# Patient Record
Sex: Male | Born: 1950 | Race: White | Hispanic: No | Marital: Married | Smoking: Former smoker
Health system: Southern US, Community
[De-identification: ages and names within clinical notes are randomized; demographics above are authoritative.]

## PROBLEM LIST (undated history)

## (undated) DIAGNOSIS — F32A Depression, unspecified: Secondary | ICD-10-CM

## (undated) DIAGNOSIS — G35 Multiple sclerosis: Secondary | ICD-10-CM

## (undated) DIAGNOSIS — G709 Myoneural disorder, unspecified: Secondary | ICD-10-CM

## (undated) DIAGNOSIS — K219 Gastro-esophageal reflux disease without esophagitis: Secondary | ICD-10-CM

## (undated) DIAGNOSIS — G4730 Sleep apnea, unspecified: Secondary | ICD-10-CM

## (undated) DIAGNOSIS — T4145XA Adverse effect of unspecified anesthetic, initial encounter: Secondary | ICD-10-CM

## (undated) DIAGNOSIS — F329 Major depressive disorder, single episode, unspecified: Secondary | ICD-10-CM

## (undated) DIAGNOSIS — T8859XA Other complications of anesthesia, initial encounter: Secondary | ICD-10-CM

## (undated) DIAGNOSIS — I2510 Atherosclerotic heart disease of native coronary artery without angina pectoris: Secondary | ICD-10-CM

## (undated) DIAGNOSIS — E119 Type 2 diabetes mellitus without complications: Secondary | ICD-10-CM

## (undated) DIAGNOSIS — I10 Essential (primary) hypertension: Secondary | ICD-10-CM

## (undated) HISTORY — PX: CORONARY ANGIOPLASTY: SHX604

## (undated) HISTORY — DX: Myoneural disorder, unspecified: G70.9

## (undated) HISTORY — DX: Type 2 diabetes mellitus without complications: E11.9

## (undated) HISTORY — PX: OTHER SURGICAL HISTORY: SHX169

## (undated) HISTORY — DX: Essential (primary) hypertension: I10

## (undated) HISTORY — DX: Gastro-esophageal reflux disease without esophagitis: K21.9

## (undated) HISTORY — DX: Multiple sclerosis: G35

## (undated) HISTORY — PX: TONSILLECTOMY: SUR1361

## (undated) HISTORY — DX: Depression, unspecified: F32.A

## (undated) HISTORY — DX: Major depressive disorder, single episode, unspecified: F32.9

---

## 2004-12-10 ENCOUNTER — Ambulatory Visit: Payer: Self-pay

## 2005-01-14 ENCOUNTER — Ambulatory Visit: Payer: Self-pay | Admitting: Internal Medicine

## 2006-01-08 ENCOUNTER — Ambulatory Visit: Payer: Self-pay | Admitting: Internal Medicine

## 2006-04-17 ENCOUNTER — Emergency Department: Payer: Self-pay | Admitting: Emergency Medicine

## 2006-12-22 ENCOUNTER — Ambulatory Visit: Payer: Self-pay | Admitting: Internal Medicine

## 2006-12-27 ENCOUNTER — Ambulatory Visit: Payer: Self-pay | Admitting: Internal Medicine

## 2007-01-13 ENCOUNTER — Ambulatory Visit: Payer: Self-pay | Admitting: Unknown Physician Specialty

## 2007-01-19 ENCOUNTER — Encounter: Payer: Self-pay | Admitting: Internal Medicine

## 2007-07-07 ENCOUNTER — Ambulatory Visit: Payer: Self-pay

## 2007-11-02 ENCOUNTER — Ambulatory Visit: Payer: Self-pay | Admitting: Family Medicine

## 2007-11-05 ENCOUNTER — Ambulatory Visit: Payer: Self-pay | Admitting: Family Medicine

## 2007-12-01 ENCOUNTER — Ambulatory Visit: Payer: Self-pay | Admitting: Unknown Physician Specialty

## 2007-12-04 ENCOUNTER — Ambulatory Visit: Payer: Self-pay | Admitting: Unknown Physician Specialty

## 2008-04-23 ENCOUNTER — Ambulatory Visit: Payer: Self-pay | Admitting: Family Medicine

## 2011-03-09 ENCOUNTER — Ambulatory Visit: Payer: Self-pay | Admitting: Surgery

## 2011-12-01 DIAGNOSIS — I2510 Atherosclerotic heart disease of native coronary artery without angina pectoris: Secondary | ICD-10-CM | POA: Insufficient documentation

## 2011-12-01 DIAGNOSIS — E785 Hyperlipidemia, unspecified: Secondary | ICD-10-CM | POA: Insufficient documentation

## 2011-12-01 DIAGNOSIS — E119 Type 2 diabetes mellitus without complications: Secondary | ICD-10-CM | POA: Insufficient documentation

## 2011-12-01 DIAGNOSIS — I10 Essential (primary) hypertension: Secondary | ICD-10-CM | POA: Insufficient documentation

## 2012-03-01 ENCOUNTER — Ambulatory Visit: Payer: Self-pay | Admitting: Unknown Physician Specialty

## 2012-03-02 LAB — PATHOLOGY REPORT

## 2012-05-26 ENCOUNTER — Ambulatory Visit: Payer: Self-pay | Admitting: Unknown Physician Specialty

## 2012-05-27 LAB — CLOSTRIDIUM DIFFICILE BY PCR

## 2012-05-31 DIAGNOSIS — L97809 Non-pressure chronic ulcer of other part of unspecified lower leg with unspecified severity: Secondary | ICD-10-CM | POA: Insufficient documentation

## 2012-05-31 DIAGNOSIS — L98499 Non-pressure chronic ulcer of skin of other sites with unspecified severity: Secondary | ICD-10-CM

## 2012-05-31 DIAGNOSIS — I771 Stricture of artery: Secondary | ICD-10-CM | POA: Insufficient documentation

## 2012-05-31 DIAGNOSIS — L89159 Pressure ulcer of sacral region, unspecified stage: Secondary | ICD-10-CM | POA: Insufficient documentation

## 2012-07-17 ENCOUNTER — Ambulatory Visit: Payer: Self-pay | Admitting: Internal Medicine

## 2012-07-19 HISTORY — PX: HERNIA REPAIR: SHX51

## 2012-08-03 ENCOUNTER — Encounter: Payer: Self-pay | Admitting: Nurse Practitioner

## 2012-08-03 ENCOUNTER — Encounter: Payer: Self-pay | Admitting: Cardiothoracic Surgery

## 2012-08-03 ENCOUNTER — Ambulatory Visit: Payer: Self-pay | Admitting: Surgery

## 2012-08-03 LAB — BASIC METABOLIC PANEL
Anion Gap: 4 — ABNORMAL LOW (ref 7–16)
BUN: 11 mg/dL (ref 7–18)
Calcium, Total: 9.1 mg/dL (ref 8.5–10.1)
EGFR (African American): >60
EGFR (Non-African Amer.): >60
Glucose: 109 mg/dL — ABNORMAL HIGH (ref 65–99)
Osmolality: 281 (ref 275–301)
Potassium: 4.7 mmol/L (ref 3.5–5.1)
Sodium: 141 mmol/L (ref 136–145)

## 2012-08-03 LAB — CBC WITH DIFFERENTIAL/PLATELET
Basophil #: 0.1 10*3/uL (ref 0.0–0.1)
Eosinophil #: 0.8 10*3/uL — ABNORMAL HIGH (ref 0.0–0.7)
HGB: 16.2 g/dL (ref 13.0–18.0)
Lymphocyte #: 3.8 10*3/uL — ABNORMAL HIGH (ref 1.0–3.6)
Lymphocyte %: 37.8 %
MCV: 97 fL (ref 80–100)
Monocyte %: 7.4 %
Neutrophil #: 4.5 10*3/uL (ref 1.4–6.5)
Neutrophil %: 45.5 %
RBC: 4.75 10*6/uL (ref 4.40–5.90)
WBC: 10 10*3/uL (ref 3.8–10.6)

## 2012-08-07 ENCOUNTER — Ambulatory Visit: Payer: Self-pay | Admitting: Surgery

## 2012-08-19 ENCOUNTER — Encounter: Payer: Self-pay | Admitting: Cardiothoracic Surgery

## 2012-08-19 ENCOUNTER — Encounter: Payer: Self-pay | Admitting: Nurse Practitioner

## 2012-09-21 DIAGNOSIS — R5381 Other malaise: Secondary | ICD-10-CM

## 2013-02-20 ENCOUNTER — Encounter: Payer: Self-pay | Admitting: Internal Medicine

## 2013-03-04 LAB — URINALYSIS, COMPLETE
Bacteria: NONE SEEN
Bilirubin,UR: NEGATIVE
Blood: NEGATIVE
Glucose,UR: NEGATIVE mg/dL (ref 0–75)
Ketone: NEGATIVE
Nitrite: NEGATIVE
Ph: 8 (ref 4.5–8.0)
Protein: NEGATIVE
Squamous Epithelial: 1
WBC UR: 39 /HPF (ref 0–5)

## 2013-03-19 ENCOUNTER — Encounter: Payer: Self-pay | Admitting: Internal Medicine

## 2013-05-10 DIAGNOSIS — N319 Neuromuscular dysfunction of bladder, unspecified: Secondary | ICD-10-CM | POA: Insufficient documentation

## 2013-06-10 DIAGNOSIS — R601 Generalized edema: Secondary | ICD-10-CM | POA: Insufficient documentation

## 2013-06-15 ENCOUNTER — Inpatient Hospital Stay: Payer: Self-pay | Admitting: Internal Medicine

## 2013-06-15 LAB — URINALYSIS, COMPLETE
Glucose,UR: NEGATIVE mg/dL (ref 0–75)
Hyaline Cast: 21
Ketone: NEGATIVE
Protein: 30
Squamous Epithelial: <1

## 2013-06-15 LAB — COMPREHENSIVE METABOLIC PANEL
Alkaline Phosphatase: 54 U/L
BUN: 23 mg/dL — ABNORMAL HIGH (ref 7–18)
Chloride: 85 mmol/L — ABNORMAL LOW (ref 98–107)
Co2: 33 mmol/L — ABNORMAL HIGH (ref 21–32)
Creatinine: 1.95 mg/dL — ABNORMAL HIGH (ref 0.60–1.30)
EGFR (African American): 42 — ABNORMAL LOW
Glucose: 102 mg/dL — ABNORMAL HIGH (ref 65–99)
Potassium: 2.8 mmol/L — ABNORMAL LOW (ref 3.5–5.1)
SGPT (ALT): 30 U/L (ref 12–78)
Sodium: 128 mmol/L — ABNORMAL LOW (ref 136–145)
Total Protein: 7.1 g/dL (ref 6.4–8.2)

## 2013-06-15 LAB — CBC
MCH: 33.8 pg (ref 26.0–34.0)
MCV: 98 fL (ref 80–100)
Platelet: 239 10*3/uL (ref 150–440)
RBC: 4.19 10*6/uL — ABNORMAL LOW (ref 4.40–5.90)
RDW: 14.3 % (ref 11.5–14.5)

## 2013-06-15 LAB — TROPONIN I: Troponin-I: <0.02 ng/mL

## 2013-06-15 LAB — MAGNESIUM: Magnesium: 1.5 mg/dL — ABNORMAL LOW

## 2013-06-16 LAB — BASIC METABOLIC PANEL
Anion Gap: 7 (ref 7–16)
Calcium, Total: 8.5 mg/dL (ref 8.5–10.1)
Chloride: 90 mmol/L — ABNORMAL LOW (ref 98–107)
Co2: 32 mmol/L (ref 21–32)
Creatinine: 1.39 mg/dL — ABNORMAL HIGH (ref 0.60–1.30)
EGFR (African American): >60
Glucose: 175 mg/dL — ABNORMAL HIGH (ref 65–99)
Potassium: 2.9 mmol/L — ABNORMAL LOW (ref 3.5–5.1)

## 2013-06-16 LAB — CBC WITH DIFFERENTIAL/PLATELET
Basophil #: 0.1 10*3/uL (ref 0.0–0.1)
Basophil %: 0.6 %
Eosinophil #: 0.4 10*3/uL (ref 0.0–0.7)
Eosinophil %: 3.5 %
HCT: 36.4 % — ABNORMAL LOW (ref 40.0–52.0)
HGB: 13 g/dL (ref 13.0–18.0)
Lymphocyte #: 3.4 10*3/uL (ref 1.0–3.6)
MCH: 34.7 pg — ABNORMAL HIGH (ref 26.0–34.0)
MCHC: 35.8 g/dL (ref 32.0–36.0)
MCV: 97 fL (ref 80–100)
Monocyte #: 1.3 x10 3/mm — ABNORMAL HIGH (ref 0.2–1.0)
Neutrophil #: 6.5 10*3/uL (ref 1.4–6.5)
Neutrophil %: 55.3 %
Platelet: 199 10*3/uL (ref 150–440)
RBC: 3.75 10*6/uL — ABNORMAL LOW (ref 4.40–5.90)
RDW: 14.2 % (ref 11.5–14.5)

## 2013-06-16 LAB — MAGNESIUM: Magnesium: 1.7 mg/dL — ABNORMAL LOW

## 2013-06-17 LAB — CBC WITH DIFFERENTIAL/PLATELET
Basophil #: 0 10*3/uL (ref 0.0–0.1)
Basophil %: 0.7 %
Eosinophil %: 5.5 %
HCT: 36.3 % — ABNORMAL LOW (ref 40.0–52.0)
HGB: 13 g/dL (ref 13.0–18.0)
Lymphocyte #: 2.9 10*3/uL (ref 1.0–3.6)
MCH: 35 pg — ABNORMAL HIGH (ref 26.0–34.0)
MCHC: 35.7 g/dL (ref 32.0–36.0)
MCV: 98 fL (ref 80–100)
Monocyte %: 10 %
Neutrophil #: 2.8 10*3/uL (ref 1.4–6.5)
Neutrophil %: 40.9 %
RDW: 14.3 % (ref 11.5–14.5)
WBC: 6.9 10*3/uL (ref 3.8–10.6)

## 2013-06-17 LAB — BASIC METABOLIC PANEL
Calcium, Total: 8.6 mg/dL (ref 8.5–10.1)
Chloride: 101 mmol/L (ref 98–107)
Co2: 35 mmol/L — ABNORMAL HIGH (ref 21–32)
Creatinine: 0.8 mg/dL (ref 0.60–1.30)
EGFR (Non-African Amer.): >60
Glucose: 137 mg/dL — ABNORMAL HIGH (ref 65–99)
Osmolality: 277 (ref 275–301)
Potassium: 3.2 mmol/L — ABNORMAL LOW (ref 3.5–5.1)
Sodium: 138 mmol/L (ref 136–145)

## 2013-06-18 LAB — CBC WITH DIFFERENTIAL/PLATELET
Eosinophil #: 0.5 10*3/uL (ref 0.0–0.7)
Lymphocyte %: 45.1 %
MCH: 34.9 pg — ABNORMAL HIGH (ref 26.0–34.0)
Monocyte #: 0.7 x10 3/mm (ref 0.2–1.0)
RBC: 3.83 10*6/uL — ABNORMAL LOW (ref 4.40–5.90)
RDW: 14.6 % — ABNORMAL HIGH (ref 11.5–14.5)

## 2013-06-18 LAB — BASIC METABOLIC PANEL
BUN: 9 mg/dL (ref 7–18)
Creatinine: 0.68 mg/dL (ref 0.60–1.30)
Glucose: 140 mg/dL — ABNORMAL HIGH (ref 65–99)
Potassium: 3.8 mmol/L (ref 3.5–5.1)

## 2013-11-20 DIAGNOSIS — R1310 Dysphagia, unspecified: Secondary | ICD-10-CM | POA: Insufficient documentation

## 2013-11-22 DIAGNOSIS — G6181 Chronic inflammatory demyelinating polyneuritis: Secondary | ICD-10-CM | POA: Insufficient documentation

## 2013-11-22 DIAGNOSIS — G4733 Obstructive sleep apnea (adult) (pediatric): Secondary | ICD-10-CM | POA: Insufficient documentation

## 2013-11-22 DIAGNOSIS — M62838 Other muscle spasm: Secondary | ICD-10-CM | POA: Insufficient documentation

## 2013-11-22 DIAGNOSIS — N481 Balanitis: Secondary | ICD-10-CM | POA: Insufficient documentation

## 2013-11-22 DIAGNOSIS — F324 Major depressive disorder, single episode, in partial remission: Secondary | ICD-10-CM | POA: Insufficient documentation

## 2013-11-22 DIAGNOSIS — N390 Urinary tract infection, site not specified: Secondary | ICD-10-CM | POA: Insufficient documentation

## 2013-11-22 DIAGNOSIS — T83511A Infection and inflammatory reaction due to indwelling urethral catheter, initial encounter: Secondary | ICD-10-CM

## 2014-03-05 ENCOUNTER — Ambulatory Visit: Payer: Self-pay | Admitting: Orthopedic Surgery

## 2014-07-05 DIAGNOSIS — G939 Disorder of brain, unspecified: Secondary | ICD-10-CM | POA: Insufficient documentation

## 2014-07-22 ENCOUNTER — Emergency Department: Payer: Self-pay | Admitting: Emergency Medicine

## 2014-09-13 DIAGNOSIS — Z22322 Carrier or suspected carrier of Methicillin resistant Staphylococcus aureus: Secondary | ICD-10-CM | POA: Insufficient documentation

## 2014-11-08 NOTE — Op Note (Signed)
PATIENT NAME:  Jesus Sanchez, Jesus Sanchez MR#:  [id number] DATE OF BIRTH:  Jan 12, 1951  DATE OF PROCEDURE:  08/07/2012  PREOPERATIVE DIAGNOSIS: Right direct inguinal hernia.   POSTOPERATIVE DIAGNOSIS: Right direct inguinal hernia.   PROCEDURE PERFORMED: Laparoscopic preperitoneal right inguinal herniorrhaphy with mesh.   SURGEON: Sherri Rad, M.D.   ASSISTANT: Phoebe Perch, MD  TYPE OF ANESTHESIA: General oral endotracheal.   FINDINGS: Large direct inguinal hernia.   SPECIMENS: None.   ESTIMATED BLOOD LOSS: 25 mL.   DESCRIPTION OF PROCEDURE: With the patient in the supine position, general oral endotracheal anesthesia was induced. Foley catheter was placed, under sterile technique. A Coude catheter was required.   The patient's abdomen was widely prepped and draped with ChloraPrep solution. Timeout was observed.   A transversely oriented skin incision was fashioned to the right of the umbilicus, in the midline, with scalpel, and carried down with sharp dissection to the rectus fascia. Fascia was incised in a similar fashion with scalpel, rectus muscle was swept laterally and the preperitoneal space was entered. The dissecting Origin balloon was then placed and under direct visualization preperitoneal pocket was fashioned with insufflation of the balloon. This balloon was removed, operating trocar balloon was placed.   The preperitoneal space was then insuflated.   Two 5 mm trocars were then placed just below the balloon, in the midline. Dissection was undertaken laterally first with exposure of the abdominal wall lateral to the spermatic cord structures. Medially Cooper's ligament was identified. Anteriorly to this there was a large direct hernia defect, which was able to be reduced back into the abdomen and the pseudocapsule and hernia sac was able to be reduced back into the preperitoneal space tacking it at two points to Cooper's ligament.   Skeletonization of the spermatic cord demonstrated  no evidence of an indirect hernia defect. A window was fashioned behind the cord. A 4 inch x 6 inch scissored mesh was then placed into the space, unfurled and secured to Cooper's ligament with several ProTack staples, the two tails being placed around the spermatic cord andtacked laterally. The mesh was then placed such to obliterate the defect, in the direct hernia position. This was accomplished by placing the ProTacks high up on the abdominal wall. Care was taken to avoid the course of the ilioinguinal nerve.   Where the two mesh met lateral to the cord one ProTack was placed, from one mesh layer to the other, to recreate the internal ring.   The preperitoneal space was then inspected for hemostasis and found to be hemostatic, irrigated briefly and aspirated. The mesh was then secured laterally while the CO2 was deflated. Ports were then removed under direct visualization. A total of 30 mL 0.25% plain Marcaine was infiltrated along all skin and fascial incisions prior to closure. The anterior rectus sheath was then reapproximated with a figure-of-eight #0 Vicryl suture and #4-0 Vicryl subcuticulars applied to all skin edges. Benzoin, Steri-Strips and occlusive sterile dressing was placed. Foley catheter was then removed at the completion of the operation. ____________________________ Jeannette How Marina Gravel, MD mab:sb D: 08/08/2012 10:58:27 ET T: 08/08/2012 11:06:50 ET JOB#: [id number]  cc: Elta Guadeloupe A. Marina Gravel, MD, <Dictator> Hortencia Conradi MD ELECTRONICALLY SIGNED 08/11/2012 7:42

## 2014-11-08 NOTE — Discharge Summary (Signed)
PATIENT NAME:  Jesus Sanchez, Jesus Sanchez MR#:  [id number] DATE OF BIRTH:  10/18/1950  DATE OF ADMISSION:  06/15/2013 DATE OF DISCHARGE: 06/18/2013   DISCHARGE DIAGNOSES:  1.  Acute renal failure, likely prerenal.  2.  Congestive heart failure, edema now improved.  3.  Multiple sclerosis, severe, treated at [facility].  4.  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  5.  Sleep apnea on chronic CPAP.  6.  Hyponatremia.  DISCHARGE MEDICATIONS: Per [facility] med reconciliation system. Main change from medications. Torsemide will be changed from a standing dose to a p.r.n. dose.   HISTORY AND PHYSICAL: Please see detailed history and physical done on admission.   HOSPITAL COURSE: The patient was admitted weak and confused with creatinine 1.92 and sodium 128. His torsemide was held. He was given IV fluids and normal saline. He improved remarkably. His creatinine came to 1.39 on the 29th, 0.8 on the 30th and his sodium was normal by the 30th at 138. He did have some hypokalemia, which was replaced as well. He is back to his normal strength as well. The precipitating event for his recent admission here as well as at [facility] was the fact that his wheelchair broke. He could not keep his legs fully elevated and became markedly edematous and had to be diuresed at [facility] and then again at home. He ended up with electrolyte abnormalities and acute renal failure from the diuresis, which again is now improved. He should do well back in his usual state and his arm strength is much improved as well as using his bands, etc. at this point. He is well equipped at home for this. He will be discharged. He will follow up with [place] as previously scheduled. He will watch for his edema closely and will call if any questions.   ____________________________ Marya Amsler. Dareen Piano, MD mwa:aw D: 06/18/2013 08:10:35 ET T: 06/18/2013 08:40:43 ET JOB#: [id number]  cc: Marya Amsler. Dareen Piano, MD,  <Dictator> Lauro Regulus MD ELECTRONICALLY SIGNED 06/18/2013 13:14

## 2014-11-08 NOTE — H&P (Signed)
PATIENT NAME:  Jesus Sanchez, MCMANNIS MR#:  [id number] DATE OF BIRTH:  03/01/1951  DATE OF ADMISSION:  06/15/2013  PRIMARY CARE PHYSICIAN:  Dr. Einar Crow.   CHIEF COMPLAINT: Severe weakness, dizziness, lightheadedness, weight loss of almost 30 pounds after diuresis.   REFERRING PHYSICIAN: Dr. Lenard Lance.   HISTORY OF PRESENT ILLNESS: This is a very nice 64 year old gentleman with history of MS (multiple sclerosis), who has his care mostly at [facility]. He goes there every month for infusion therapy.   Apparently, he had a history of 2 weeks of worsening edema of the lower extremities bilaterally, for which he was evaluated recently at [facility] after he started having some redness and increased  local temperature of the right lower extremity, and that was last Sunday. The patient was admitted overnight, discharged on Monday. Got Lasix IV, and he did have fast weight loss of 20 pounds overnight. After he was discharged on Monday, he was sent home with torsemide and Keflex. The patient noticed that after a day he had increase of edema back again to the way that it was looking before the diuresis, for which he went to see Dr. Dareen Piano. Dr. Dareen Piano saw him this past Wednesday and increased his torsemide to twice daily.   During the last couple of days, he has had weight loss, which is a total of 28 pounds since last week, and started getting really dizzy, lightheaded, weak, confused and his blood pressure was starting to drop. His nurse at home health saw him today and noticed that his blood pressure was low and he was confused, and decided to come to the ER. In the ER, he was evaluated.  He looked dry. IV fluids were given, and the patient is starting to pick up.   At this moment, the patient is doing much better, but he is in acute kidney failure. His previous baseline creatinine was around 0.7; today is 1.95, so more than double increase. He has significant hypokalemia and hyponatremia with increased CO2,  which is likely due to the use of diuretics, and hypomagnesemia.   His white count is elevated to 15,000, but this could be due to fluid restriction, as he seemed to be better as far as his cellulitis.    The patient is admitted for rehydration and correction of his kidney failure.   REVIEW OF SYSTEMS:  CONSTITUTIONAL: No fever. No fatigue. No weakness. Weight loss as mentioned above.  EYES: Positive blurry vision, which is on and off due to his MS. No epistaxis. No difficulty hearing.  RESPIRATORY: No cough, wheezing, hemoptysis or painful respirations.  CARDIOVASCULAR: No chest pain, orthopnea or syncope.  GASTROINTESTINAL: No nausea, vomiting, abdominal pain, constipation or diarrhea.  GENITOURINARY: No dysuria, hematuria, changes in frequency. Actually, the patient has a chronic Foley catheter. No infection recently. No prostatitis.  ENDOCRINE: No polyuria, polydipsia or polyphagia, cold or heat intolerance.  HEMATOLOGIC AND LYMPHATIC: No anemia, easy bruising or swollen glands.  SKIN: No new rashes or petechiae. He only has the right lower extremity erythema due to cellulitis, which has improved.  MUSCULOSKELETAL: The patient has significant weakness of the right lower extremity with numbness. He is wheelchair-bound, and swelling of the lower extremities as mentioned above, but no joint swelling.  NEUROLOGIC: Numbness of the right lower extremity. No CVA. No TIA.  Positive multiple sclerosis. PSYCHIATRIC: No significant insomnia or depression.   PAST MEDICAL HISTORY: 1.  Multiple sclerosis with right leg weakness and blurry vision and voice changes.  2.  Hoarseness due to MS.  3.  Diabetes.  4.  Hypertension.  5.  Coronary artery disease, status post a stent 10 years ago due to 90% blockage of the LDL.   ALLERGIES: PERCOCET.   PAST SURGICAL HISTORY:  1.  Hernioplasty.  2.  I and D's of the lower extremities due to blood clots infected with abscesses at the level of both calf  muscles.   FAMILY HISTORY: Negative for MI. Negative for CAD.  Negative for cancer. His mother died from colitis complications.   SOCIAL HISTORY: The patient used to smoke. He smoked for over 30 years, 1 pack a day. He quit in 1990. He does not drink any alcohol. He lives with his wife. He is bed and wheelchair bound. He needs full assistance for transfers out of the bed, but he is able to transfer from the wheelchair to the commode by himself.   CURRENT MEDICATIONS:  Donnamarie Poag took intravenous, tramadol 50 mg every 6 hours, simvastatin 20 mg once daily, ranitidine 300 mg daily, omeprazole 40 mg daily, Norco 5/325 mg 4 times daily, neomycin/polymyxin eye drops, naproxen 500 mg twice daily, metoprolol 50 mg extended release once a day, metformin 500 mg take 2 tablets twice daily, hydrochlorothiazide 12.5 mg with losartan 100 mg once a day, glipizide 5 mg twice daily, gabapentin 300 mg twice daily, diazepam 5 mg 2 tablets once a day, clonazepam 1 mg 3 times daily, citalopram 40 mg once a day, bupropion 200 mg once a day, aspirin 81 mg daily, Tylenol 325 mg every 4 hours as needed for pain. I checked with the wife, and she is telling me these are the medication; she just does not have a list, but she thinks they are all updated.   PHYSICAL EXAMINATION: VITAL SIGNS: Blood pressure 90/47, pulse 65, respirations 18, temperature 97.6.  GENERAL: The patient is alert and he is oriented x 3. There is no acute distress. No respiratory distress. Hemodynamically stable.  HEENT: Pupils are equal and reactive. Extraocular movements are intact. Mucosa is dry. Anicteric sclerae. Pink conjunctivae. No oral lesions.  NECK: Supple. No JVD. No thyromegaly. No adenopathy. No carotid bruits. No rigidity.  CARDIOVASCULAR: Regular rate and rhythm. No murmurs, rubs or gallops. No displacement of PMI.  LUNGS: Clear without any wheezing or crepitus. No use of accessory muscles.  ABDOMEN: Soft, nontender, nondistended. No  hepatosplenomegaly. No masses. Bowel sounds are positive.  GENITOURINARY: Negative for external lesions. Foley catheter chronic.  EXTREMITIES: Positive edema +1, with mild erythema of the right pretibial area, which actually is improved from a week ago, as per the wife.  VASCULAR: Pulses +2. Capillary refill less than 3.  NEUROLOGIC:  Right lower extremity weakness significant. Left lower extremity weakness, but not as much as the right,  4/5, while the right one is 2/5. Upper extremity equal strength, 5/5. Cranial nerves II through XII intact.  The patient is alert and oriented x 3.  PSYCHIATRIC: No significant hallucinations or delirium. The patient is actually alert and oriented x 3 now; earlier, he was very lethargic but improved.  MUSCULOSKELETAL: No joint effusion or joint deformities.  SKIN: As mentioned above, rash in the right lower extremity.   LABORATORY RESULTS: Glucose 102, BUN 23, creatinine 1.95, sodium 128, potassium 2.8. CO2 is 33. Magnesium 1.5. LFTs were within normal limits. Troponin is negative. White count 15.5, hemoglobin 14.2.   Urinalysis has increased white blood cells, but this could be chronic pyuria, for which we are going to send a  urine culture before starting antibiotics.   Chest x-ray has not been done for which we are going to order one.   EKG: Normal sinus rhythm with slight prolongation of the QT.   ASSESSMENT AND PLAN: Very nice 64 year old gentleman with history of multiple sclerosis, diabetes, hypertension, coronary artery disease, status post a stent 10 years ago, comes with significant decrease of mentation, low-normal blood pressure and acute kidney injury.  1.  Changes in mental status, likely secondary to metabolic encephalopathy secondary to dehydration.   The patient actually now is back to his normal self. He was never completely obtunded. He was just slightly confused.   2.  Severe dehydration. This is secondary to overdiuresis. The patient is on  IV fluids at this moment. He is starting to improve after volume has been given.   3.  Acute kidney injury, this is again secondary to the problems above. The patient has significant increase of the creatinine from baseline of 0.7 up to 1.95.  Lasix was given IV in the hospital, and he had 20 pounds of weight loss. Torsemide was described as an outpatient and increased, and the patient reacted pretty well to it. He was able to be diuresed, but unfortunately, now he is on acute kidney injury, weak, dizzy and lightheaded, symptomatic. We are going to replace his fluids, replace his electrolytes.  4.  Hypokalemia, likely due to overdiuresis. The patient received 40 mEq already of potassium. We are going to give him 20 more IV, as his potassium level is so low.  5.  Hypomagnesemia. Replace with IV magnesium.  6.  Hyponatremia. This is secondary to intravascular volume depletion. The patient is also taking hydrochlorothiazide, for which we are going to hold it. Due to his acute kidney injury, we are also going to hold the metformin. We are also going to hold the losartan and the hydrochlorothiazide. Hold any NSAIDs that the patient is taking as well.  7.  Multiple sclerosis. The patient is stable at this moment. The weakness is likely secondary to dehydration rather than the multiple sclerosis, for which we are going to just monitor closely. If there are any doubts, we are going to get neurology involved.  8.  Deep vein thrombosis prophylaxis with heparin, gastrointestinal prophylaxis with Pepcid, as the patient is taking outpatient and Protonix as well. He is taking both. He says that that is the only thing that helps.  9.  Other medical problems are stable.   I spent about 60 minutes with this patient. We are going to transfer care to Dr. Einar Crow.    ____________________________ Felipa Furnace, MD rsg:dmm D: 06/15/2013 20:18:50 ET T: 06/15/2013 21:00:30  ET JOB#: [id number]  cc: Felipa Furnace, MD, <Dictator> Josealberto Montalto Juanda Chance MD ELECTRONICALLY SIGNED 06/16/2013 12:27

## 2014-11-17 NOTE — Consult Note (Signed)
PATIENT NAME:  Jesus Sanchez, DOCTER MR#:  [id number] DATE OF BIRTH:  [birth year]  DATE OF CONSULTATION:  07/22/2014  REFERRING PHYSICIAN:  Briant Sites. Joni Fears, MD  CONSULTING PHYSICIAN:  Sherlynn Stalls, MD  REASON FOR CONSULTATION: SP tube malfunction.  HISTORY OF PRESENT ILLNESS: This is a 64 year old male with a history of multiple sclerosis and neurogenic bladder whose bladder is managed by an indwelling chronic SP tube. He is followed at [facility] urology at which time his suprapubic tube was placed approximately 1 year ago. This was changed by home health nursing on a monthly basis and today upon suprapubic tube exchange, there was some bleeding noted and his nurse was unable to replace the catheter. He called [facility], who were unable to accommodate him in the office today; therefore, he was advised to come to the local Emergency Room. Dr. Joni Fears did attempt to replace the catheter, without success, and urology was called to assist.   Of note, the patient has been on vancomycin for a recent admission with a staphylococcal wound infection following baclofen pump placement. He was recently discharged and continued on vancomycin with a PICC in place.   I did attempt to replace his suprapubic tube and the tract appeared patent; however, I was unsuccessful both with a regular 16-French as well as a 15-French coud. I could not identify the true tract. Therefore, the decision was made to proceed with attempted cystoscopic placement over a wire. Please see procedure note for details.   PAST MEDICAL HISTORY: Includes:  1.  MS.  2.  Depression.  3.  Anxiety.  4.  History of recent MRSA infection.  5.  History of unstable angina.  6.  Obstructive sleep apnea.  7.  Hypertension.  8.  Hyperlipidemia.  9.  Neurogenic bladder.  10.  Optic neuritis.  11.  Status post cardiac stent.  12.  Diabetes.  PAST SURGICAL HISTORY: Includes:  1.  Status post hernia repair.  2.  Status post baclofen pump  placement.  3.  Status post suprapubic tube placement.   ALLERGIES: PERCOCET CAUSES ITCHING.   SOCIAL HISTORY: Lives at home with his wife, who is with him today. He is currently wheelchair-bound. He is a former smoker but quit many years ago. No alcohol or any other drugs.   FAMILY HISTORY: Noncontributory.   MEDICATIONS: 1.  Vancomycin 1.5 g b.i.d. 2.  Tysabri inject IV as needed.  3.  Simvastatin 20 mg daily.  4.  Metformin 500 mg b.i.d.  5.  Losartan 100 mg daily.  6.  Glipizide 5 mg b.i.d. 7.  Gabapentin 300 mg 3 caps b.i.d.  8.  Diazepam 5 mg 2 tabs p.o. b.i.d.  9.  Citalopram 40 mg daily.  10.  Bupropion 200 mg SR daily.  11.  Baclofen 10 mg b.i.d.   PHYSICAL EXAMINATION: VITAL SIGNS: Temperature 97.8, pulse 72, respirations 16, blood pressure 156/94, pulse oximetry 97 on room air. GENERAL: No pain. No acute distress, alert and oriented. He answers all questions appropriately. HEENT: Normocephalic, atraumatic. Normal dentition. Moist mucous membranes.  NECK: Supple. No masses. LYMPHATIC: No obvious inguinal or axillary lymphadenopathy.  RESPIRATORY: No increased work of breathing or use of accessory muscles.  CARDIOVASCULAR: Bilateral trace lower extremity edema. SKIN: Bilateral lower extremity skin changes consistent with stasis.  ABDOMEN: Soft, nontender, nondistended. Bladder is nonpalpable. Suprapubic tract present with a trace amount of blood around the opening. No significant drainage or purulence. No surrounding erythema.  GENITOURINARY: Normal. Buried phallus. Scrotum unremarkable.  No edema.  NEUROLOGIC: Not able to move lower extremities bilaterally. Cranial nerves grossly intact.  REVIEW OF SYSTEMS: A 12-point review of systems was performed and is otherwise negative other than as per HPI.   PROCEDURE: The patient was identified and timeout was performed. He was prepped and draped in the standard surgical fashion at the bedside in the ER. He had already received  IV vancomycin, therefore no additional antibiotics were given. A flexible cystoscope was used within the suprapubic tract to identify the true lumen. There were multiple small false passages and ragged tissue noted. The true lumen was very narrow. I was unable to pass the scope through this. I eventually was able to get a super-stiff wire into the bladder and the patient confirmed that he indeed felt the wire in the bladder. I then used a bedside ultrasound probe to ensure that the wire was indeed within the bladder, which was easily able to be seen. I then attempted to place a 16-French Council-tip catheter over the wire into the bladder but met resistance deeper within tract and was unsuccessful. I then serially dilated using Cook disposable urethral dilators starting with a 14-French up to 18-French, which he tolerated quite well. Upon the tip of the dilator reaching the bladder, there was return of clear yellow urine each time, confirming the proper positioning, and I was able to advance a 16-French Council-tip catheter over the wire into the bladder. Once this was in adequate position, the wire was withdrawn and clear yellow urine drained out from the catheter. The balloon was inflated with 10 mL of saline and secured to the patient's left leg, where a Cath-Secure was already in place.   ASSESSMENT AND PLAN: This is a 64 year old male with multiple sclerosis and neurogenic bladder with a chronic indwelling suprapubic tube status post difficulty with replacement, requiring cystoscopic replacement and dilation of the tract. I advised the patient to avoid any manipulation of this current suprapubic tube for at least 4 weeks, until his next catheter change. I would recommend that the first change after this difficult replacement be done at his urologist's office and can be done over a wire if necessary. He should continue intravenous antibiotics as previously prescribed. The patient was advised a small amount of  bleeding around the tract site is normal.  Thank you for allowing me to participate in the care of this patient.   ____________________________ Sherlynn Stalls, MD ajb:ST D: 07/22/2014 20:36:59 ET T: 07/22/2014 21:32:20 ET JOB#: [id number]  cc: Sherlynn Stalls, MD, <Dictator> Sherlynn Stalls MD ELECTRONICALLY SIGNED 07/31/2014 15:52

## 2015-03-03 DIAGNOSIS — Z9889 Other specified postprocedural states: Secondary | ICD-10-CM | POA: Insufficient documentation

## 2015-09-12 DIAGNOSIS — Z9359 Other cystostomy status: Secondary | ICD-10-CM | POA: Insufficient documentation

## 2015-10-02 ENCOUNTER — Inpatient Hospital Stay: Payer: Medicare Other | Attending: Family Medicine | Admitting: Family Medicine

## 2015-10-02 ENCOUNTER — Inpatient Hospital Stay: Payer: Medicare Other

## 2015-10-02 ENCOUNTER — Encounter: Payer: Self-pay | Admitting: Family Medicine

## 2015-10-02 VITALS — BP 119/72 | HR 68 | Resp 18

## 2015-10-02 VITALS — BP 124/75 | HR 64 | Temp 97.2°F | Resp 18 | Wt 200.0 lb

## 2015-10-02 DIAGNOSIS — R531 Weakness: Secondary | ICD-10-CM | POA: Diagnosis not present

## 2015-10-02 DIAGNOSIS — M791 Myalgia: Secondary | ICD-10-CM | POA: Diagnosis not present

## 2015-10-02 DIAGNOSIS — Z87891 Personal history of nicotine dependence: Secondary | ICD-10-CM | POA: Diagnosis not present

## 2015-10-02 DIAGNOSIS — Z7984 Long term (current) use of oral hypoglycemic drugs: Secondary | ICD-10-CM

## 2015-10-02 DIAGNOSIS — Z7982 Long term (current) use of aspirin: Secondary | ICD-10-CM | POA: Insufficient documentation

## 2015-10-02 DIAGNOSIS — E119 Type 2 diabetes mellitus without complications: Secondary | ICD-10-CM | POA: Diagnosis not present

## 2015-10-02 DIAGNOSIS — G35 Multiple sclerosis: Secondary | ICD-10-CM

## 2015-10-02 DIAGNOSIS — I10 Essential (primary) hypertension: Secondary | ICD-10-CM | POA: Insufficient documentation

## 2015-10-02 DIAGNOSIS — Z79899 Other long term (current) drug therapy: Secondary | ICD-10-CM | POA: Insufficient documentation

## 2015-10-02 DIAGNOSIS — K219 Gastro-esophageal reflux disease without esophagitis: Secondary | ICD-10-CM | POA: Diagnosis not present

## 2015-10-02 HISTORY — DX: Multiple sclerosis: G35

## 2015-10-02 MED ORDER — SODIUM CHLORIDE 0.9 % IV SOLN
Freq: Once | INTRAVENOUS | Status: AC
  Administered 2015-10-02: 11:00:00 via INTRAVENOUS
  Filled 2015-10-02: qty 1000

## 2015-10-02 MED ORDER — ACETAMINOPHEN 500 MG PO TABS
1000.0000 mg | ORAL_TABLET | Freq: Once | ORAL | Status: AC
  Administered 2015-10-02: 1000 mg via ORAL
  Filled 2015-10-02: qty 2

## 2015-10-02 MED ORDER — SODIUM CHLORIDE 0.9 % IV SOLN
300.0000 mg | Freq: Once | INTRAVENOUS | Status: AC
  Administered 2015-10-02: 300 mg via INTRAVENOUS
  Filled 2015-10-02: qty 15

## 2015-10-02 NOTE — Progress Notes (Signed)
Jesus Sanchez  [phone]  [phone]     Jesus Sanchez DOB: 23-Oct-1950  MR#: [id number]  AOZ#:[id number]  No care team member to display  CHIEF COMPLAINT:  Chief Complaint  Patient presents with  . New Evaluation    INTERVAL HISTORY:  Patient for initial consultation to establish care for outpatient infusions for Tysabri for treatment of MS. He is followed by Neurologist, Dr. Moshe Cipro, at [facility]. He was most recently seen in March 2017 and his next follow up is scheduled for June 2017. He reports being in his usual state of health. He is confined to a motorized wheelchair and has very limited use of lower extremities. He is capable of transferring but is unable to bear any weight. He does have a Baclofen pump in place. He also has a suprapubic catheter in place for neurogenic bladder.   REVIEW OF SYSTEMS:   Review of Systems  Constitutional: Negative for fever, chills, weight loss, malaise/fatigue and diaphoresis.  HENT: Negative.   Eyes: Negative.   Respiratory: Negative for cough, hemoptysis, sputum production, shortness of breath and wheezing.   Cardiovascular: Negative for chest pain, palpitations, orthopnea, claudication, leg swelling and PND.  Gastrointestinal: Negative for heartburn, nausea, vomiting, abdominal pain, diarrhea, constipation, blood in stool and melena.  Genitourinary: Negative.        Chronic suprapubic catheter  Musculoskeletal: Positive for myalgias.  Skin: Negative.   Neurological: Positive for tingling, sensory change, focal weakness and weakness. Negative for dizziness and seizures.  Endo/Heme/Allergies: Does not bruise/bleed easily.  Psychiatric/Behavioral: Negative for depression. The patient is not nervous/anxious and does not have insomnia.     As per HPI. Otherwise, a complete review of systems is negatve.   PAST MEDICAL HISTORY: Past Medical History  Diagnosis Date  . Depression   .  Diabetes mellitus without complication ([place])   . GERD (gastroesophageal reflux disease)   . Hypertension   . Neuromuscular disorder ([place])   . MS (multiple sclerosis) ([place]) 10/02/2015    PAST SURGICAL HISTORY: History reviewed. No pertinent past surgical history.  FAMILY HISTORY History reviewed. No pertinent family history.  GYNECOLOGIC HISTORY:  No LMP for male patient.     ADVANCED DIRECTIVES:    HEALTH MAINTENANCE: Social History  Substance Use Topics  . Smoking status: Former Smoker -- 2.00 packs/day for 25 years  . Smokeless tobacco: Former Systems developer    Quit date: 05/20/1979  . Alcohol Use: None    Allergies  Allergen Reactions  . Oxycodone-Acetaminophen Itching    Current Outpatient Prescriptions  Medication Sig Dispense Refill  . aspirin EC 81 MG tablet Take 81 mg by mouth.    Marland Kitchen atorvastatin (LIPITOR) 10 MG tablet Take 10 mg by mouth.    . baclofen (GABLOFEN) 40000 MCG/20ML SOLN by Intrathecal route.    . citalopram (CELEXA) 40 MG tablet Take 40 mg by mouth.    . gabapentin (NEURONTIN) 300 MG capsule Take 900 mg by mouth. 1200 mg at hs 61m  in am    . losartan-hydrochlorothiazide (HYZAAR) 100-25 MG tablet     . metFORMIN (GLUCOPHAGE) 500 MG tablet Take by mouth.    . natalizumab (TYSABRI) 300 MG/15ML injection Inject into the vein.    .Marland Kitchenoxybutynin (DITROPAN-XL) 5 MG 24 hr tablet Take by mouth.    . pantoprazole (PROTONIX) 40 MG tablet Take by mouth.    . trimethoprim (TRIMPEX) 100 MG tablet Take 100 mg by mouth.     No current facility-administered  medications for this visit.   Facility-Administered Medications Ordered in Other Visits  Medication Dose Route Frequency Provider Last Rate Last Dose  . 0.9 %  sodium chloride infusion   Intravenous Once Evlyn Kanner, NP      . acetaminophen (TYLENOL) tablet 1,000 mg  1,000 mg Oral Once Evlyn Kanner, NP      . natalizumab (TYSABRI) 300 mg in sodium chloride 0.9 % 100 mL IVPB  300 mg Intravenous Once Evlyn Kanner, NP        OBJECTIVE: BP 124/75 mmHg  Pulse 64  Temp(Src) 97.2 F (36.2 C) (Tympanic)  Resp 18  Wt 200 lb (90.719 kg)   There is no height on file to calculate BMI.    ECOG FS:3 - Symptomatic, >50% confined to bed  General: Well-developed, well-nourished, no acute distress. Eyes: Pink conjunctiva, anicteric sclera. HEENT: Normocephalic, moist mucous membranes, clear oropharnyx. Lungs: Clear to auscultation bilaterally. Heart: Regular rate and rhythm. No rubs, murmurs, or gallops. Abdomen: Soft, nontender, nondistended. No organomegaly noted, normoactive bowel sounds. Musculoskeletal: BLE weakness, right> left, No edema, cyanosis, or clubbing. Neuro: Alert, answering all questions appropriately. Cranial nerves are grossly intact. Skin: No rashes or petechiae noted. Psych: Normal affect.   LAB RESULTS:  No visits with results within 3 Day(s) from this visit. Latest known visit with results is:  [facility] Conversion on 06/15/2013  Component Date Value Ref Range Status  . Glucose 06/15/2013 102* 65-99 mg/dL Final  . BUN 06/15/2013 23* 7-18 mg/dL Final  . Creatinine 06/15/2013 1.95* 0.60-1.30 mg/dL Final  . Sodium 06/15/2013 128* 136-145 mmol/L Final  . Potassium 06/15/2013 2.8* 3.5-5.1 mmol/L Final  . Chloride 06/15/2013 85* 98-107 mmol/L Final  . Co2 06/15/2013 33* 21-32 mmol/L Final  . Calcium, Total 06/15/2013 9.1  8.5-10.1 mg/dL Final  . SGOT(AST) 06/15/2013 37  15-37 Unit/L Final  . SGPT (ALT) 06/15/2013 30  12-78 U/L Final  . Alkaline Phosphatase 06/15/2013 54   Final   Comment: 45-117 NOTE: New Reference Range 06/08/13   . Albumin 06/15/2013 3.6  3.4-5.0 g/dL Final  . Total Protein 06/15/2013 7.1  6.4-8.2 g/dL Final  . Bilirubin,Total 06/15/2013 0.5  0.2-1.0 mg/dL Final  . Osmolality 06/15/2013 261  275-301 Final  . Anion Gap 06/15/2013 10  7-16 Final  . EGFR (African American) 06/15/2013 42*  Final  . EGFR (Non-African Amer.) 06/15/2013 36*  Final   Comment:  eGFR values <64m/min/1.73 m2 may be an indication of chronic kidney disease (CKD). Calculated eGFR is useful in patients with stable renal function. The eGFR calculation will not be reliable in acutely ill patients when serum creatinine is changing rapidly. It is not useful in  patients on dialysis. The eGFR calculation may not be applicable to patients at the low and high extremes of body sizes, pregnant women, and vegetarians.   . WBC 06/15/2013 15.5* 3.8-10.6 x10 3/mm 3 Final  . RBC 06/15/2013 4.19* 4.40-5.90 x10 6/mm 3 Final  . HGB 06/15/2013 14.2  13.0-18.0 g/dL Final  . HCT 06/15/2013 40.9  40.0-52.0 % Final  . MCV 06/15/2013 98  80-100 fL Final  . MCH 06/15/2013 33.8  26.0-34.0 pg Final  . MCHC 06/15/2013 34.6  32.0-36.0 g/dL Final  . RDW 06/15/2013 14.3  11.5-14.5 % Final  . Platelet 06/15/2013 239  150-440 x10 3/mm 3 Final  . Troponin-I 06/15/2013 < 0.02   Final   Comment: 0.00-0.05 0.05 ng/mL or less: NEGATIVE  Repeat testing in 3-6 hrs  if clinically  indicated. >0.05 ng/mL: POTENTIAL  MYOCARDIAL INJURY. Repeat  testing in 3-6 hrs if  clinically indicated. NOTE: An increase or decrease  of 30% or more on serial  testing suggests a  clinically important change   . Magnesium 06/15/2013 1.5*  Final   Comment: 1.8-2.4 THERAPEUTIC RANGE: 4-7 mg/dL TOXIC: > 10 mg/dL  -----------------------   . Color - urine 06/15/2013 Yellow   Final  . Clarity - urine 06/15/2013 Hazy   Final  . [place] Meth 06/15/2013 Negative  0-75 mg/dL Final  . Bilirubin,UR 06/15/2013 Negative  NEGATIVE Final  . Ketone 06/15/2013 Negative  NEGATIVE Final  . Specific Gravity 06/15/2013 1.012  1.003-1.030 Final  . Blood 06/15/2013 1+  NEGATIVE Final  . Ph 06/15/2013 5.0  4.5-8.0 Final  . Protein 06/15/2013 30 mg/dL  NEGATIVE Final  . Nitrite 06/15/2013 Negative  NEGATIVE Final  . Leukocyte Esterase 06/15/2013 Trace  NEGATIVE Final  . RBC,UR 06/15/2013 3 /HPF  0-5 /HPF Final  . WBC UR 06/15/2013  12 /HPF  0-5 /HPF Final  . Bacteria 06/15/2013 TRACE  NONE SEEN Final  . Squamous Epithelial 06/15/2013 <1 /HPF   Final  . Mucous 06/15/2013 PRESENT   Final  . Hyaline Cast 06/15/2013 21 /LPF   Final  . Micro Text Report 06/15/2013    Final                   Value:   SOURCE: CLEAN CATCH    ORGANISM 1                7000 CFU/ML GRAM NEGATIVE ROD   COMMENT                   -   ANTIBIOTIC                                                      . WBC 06/16/2013 11.7* 3.8-10.6 x10 3/mm 3 Final  . RBC 06/16/2013 3.75* 4.40-5.90 x10 6/mm 3 Final  . HGB 06/16/2013 13.0  13.0-18.0 g/dL Final  . HCT 06/16/2013 36.4* 40.0-52.0 % Final  . MCV 06/16/2013 97  80-100 fL Final  . MCH 06/16/2013 34.7* 26.0-34.0 pg Final  . MCHC 06/16/2013 35.8  32.0-36.0 g/dL Final  . RDW 06/16/2013 14.2  11.5-14.5 % Final  . Platelet 06/16/2013 199  150-440 x10 3/mm 3 Final  . Neutrophil % 06/16/2013 55.3   Final  . Lymphocyte % 06/16/2013 29.5   Final  . Monocyte % 06/16/2013 11.1   Final  . Eosinophil % 06/16/2013 3.5   Final  . Basophil % 06/16/2013 0.6   Final  . Neutrophil # 06/16/2013 6.5  1.4-6.5 x10 3/mm 3 Final  . Lymphocyte # 06/16/2013 3.4  1.0-3.6 x10 3/mm 3 Final  . Monocyte # 06/16/2013 1.3* 0.2-1.0 x10 3/mm  Final  . Eosinophil # 06/16/2013 0.4  0.0-0.7 x10 3/mm 3 Final  . Basophil # 06/16/2013 0.1  0.0-0.1 x10 3/mm 3 Final  . Glucose 06/16/2013 175* 65-99 mg/dL Final  . BUN 06/16/2013 26* 7-18 mg/dL Final  . Creatinine 06/16/2013 1.39* 0.60-1.30 mg/dL Final  . Sodium 06/16/2013 129* 136-145 mmol/L Final  . Potassium 06/16/2013 2.9* 3.5-5.1 mmol/L Final  . Chloride 06/16/2013 90* 98-107 mmol/L Final  . Co2 06/16/2013 32  21-32 mmol/L Final  . Calcium, Total 06/16/2013  8.5  8.5-10.1 mg/dL Final  . Osmolality 06/16/2013 268  275-301 Final  . Anion Gap 06/16/2013 7  7-16 Final  . EGFR (African American) 06/16/2013 >60   Final  . EGFR (Non-African Amer.) 06/16/2013 54*  Final   Comment: eGFR  values <17m/min/1.73 m2 may be an indication of chronic kidney disease (CKD). Calculated eGFR is useful in patients with stable renal function. The eGFR calculation will not be reliable in acutely ill patients when serum creatinine is changing rapidly. It is not useful in  patients on dialysis. The eGFR calculation may not be applicable to patients at the low and high extremes of body sizes, pregnant women, and vegetarians.   . Thyroid Stimulating Horm 06/16/2013 2.03   Final   Comment: 0.45-4.50 (International Unit)  ----------------------- Pregnant patients have  different reference  ranges for TSH:  - - - - - - - - - -  Pregnant, first trimetser:  0.36 - 2.50 uIU/mL   . Magnesium 06/16/2013 1.7*  Final   Comment: 1.8-2.4 THERAPEUTIC RANGE: 4-7 mg/dL TOXIC: > 10 mg/dL  -----------------------   . Glucose 06/17/2013 137* 65-99 mg/dL Final  . BUN 06/17/2013 10  7-18 mg/dL Final  . Creatinine 06/17/2013 0.80  0.60-1.30 mg/dL Final  . Sodium 06/17/2013 138  136-145 mmol/L Final  . Potassium 06/17/2013 3.2* 3.5-5.1 mmol/L Final  . Chloride 06/17/2013 101  98-107 mmol/L Final  . Co2 06/17/2013 35* 21-32 mmol/L Final  . Calcium, Total 06/17/2013 8.6  8.5-10.1 mg/dL Final  . Osmolality 06/17/2013 277  275-301 Final  . Anion Gap 06/17/2013 2* 7-16 Final  . EGFR (African American) 06/17/2013 >60   Final  . EGFR (Non-African Amer.) 06/17/2013 >60   Final   Comment: eGFR values <691mmin/1.73 m2 may be an indication of chronic kidney disease (CKD). Calculated eGFR is useful in patients with stable renal function. The eGFR calculation will not be reliable in acutely ill patients when serum creatinine is changing rapidly. It is not useful in  patients on dialysis. The eGFR calculation may not be applicable to patients at the low and high extremes of body sizes, pregnant women, and vegetarians.   . WBC 06/17/2013 6.9  3.8-10.6 x10 3/mm 3 Final  . RBC 06/17/2013 3.71* 4.40-5.90 x10  6/mm 3 Final  . HGB 06/17/2013 13.0  13.0-18.0 g/dL Final  . HCT 06/17/2013 36.3* 40.0-52.0 % Final  . MCV 06/17/2013 98  80-100 fL Final  . MCH 06/17/2013 35.0* 26.0-34.0 pg Final  . MCHC 06/17/2013 35.7  32.0-36.0 g/dL Final  . RDW 06/17/2013 14.3  11.5-14.5 % Final  . Platelet 06/17/2013 181  150-440 x10 3/mm 3 Final  . Neutrophil % 06/17/2013 40.9   Final  . Lymphocyte % 06/17/2013 42.9   Final  . Monocyte % 06/17/2013 10.0   Final  . Eosinophil % 06/17/2013 5.5   Final  . Basophil % 06/17/2013 0.7   Final  . Neutrophil # 06/17/2013 2.8  1.4-6.5 x10 3/mm 3 Final  . Lymphocyte # 06/17/2013 2.9  1.0-3.6 x10 3/mm 3 Final  . Monocyte # 06/17/2013 0.7  0.2-1.0 x10 3/mm  Final  . Eosinophil # 06/17/2013 0.4  0.0-0.7 x10 3/mm 3 Final  . Basophil # 06/17/2013 0.0  0.0-0.1 x10 3/mm 3 Final  . Glucose 06/18/2013 140* 65-99 mg/dL Final  . BUN 06/18/2013 9  7-18 mg/dL Final  . Creatinine 06/18/2013 0.68  0.60-1.30 mg/dL Final  . Sodium 06/18/2013 136  136-145 mmol/L Final  . Potassium 06/18/2013 3.8  3.5-5.1 mmol/L Final  . Chloride 06/18/2013 104  98-107 mmol/L Final  . Co2 06/18/2013 27  21-32 mmol/L Final  . Calcium, Total 06/18/2013 8.7  8.5-10.1 mg/dL Final  . Osmolality 06/18/2013 273  275-301 Final  . Anion Gap 06/18/2013 5* 7-16 Final  . EGFR (African American) 06/18/2013 >60   Final  . EGFR (Non-African Amer.) 06/18/2013 >60   Final   Comment: eGFR values <1m/min/1.73 m2 may be an indication of chronic kidney disease (CKD). Calculated eGFR is useful in patients with stable renal function. The eGFR calculation will not be reliable in acutely ill patients when serum creatinine is changing rapidly. It is not useful in  patients on dialysis. The eGFR calculation may not be applicable to patients at the low and high extremes of body sizes, pregnant women, and vegetarians.   . WBC 06/18/2013 7.6  3.8-10.6 x10 3/mm 3 Final  . RBC 06/18/2013 3.83* 4.40-5.90 x10 6/mm 3 Final  . HGB  06/18/2013 13.4  13.0-18.0 g/dL Final  . HCT 06/18/2013 37.8* 40.0-52.0 % Final  . MCV 06/18/2013 99  80-100 fL Final  . MCH 06/18/2013 34.9* 26.0-34.0 pg Final  . MCHC 06/18/2013 35.4  32.0-36.0 g/dL Final  . RDW 06/18/2013 14.6* 11.5-14.5 % Final  . Platelet 06/18/2013 162  150-440 x10 3/mm 3 Final  . Neutrophil % 06/18/2013 38.7   Final  . Lymphocyte % 06/18/2013 45.1   Final  . Monocyte % 06/18/2013 8.8   Final  . Eosinophil % 06/18/2013 6.3   Final  . Basophil % 06/18/2013 1.1   Final  . Neutrophil # 06/18/2013 3.0  1.4-6.5 x10 3/mm 3 Final  . Lymphocyte # 06/18/2013 3.5  1.0-3.6 x10 3/mm 3 Final  . Monocyte # 06/18/2013 0.7  0.2-1.0 x10 3/mm  Final  . Eosinophil # 06/18/2013 0.5  0.0-0.7 x10 3/mm 3 Final  . Basophil # 06/18/2013 0.1  0.0-0.1 x10 3/mm 3 Final    STUDIES: No results found.  ASSESSMENT:  Multiple Sclerosis.  PLAN:  1. MS. Patient is establishing care for infusions of Tysabri as prescribed by his Neurologist. Prescription has been received and is scanned into our system. The patient was instructed that the decision about the treatment of multiple sclerosis, the regimen for treatment, and management of side effects is up to the patient's neurologist.  The patient understands and agrees to contact Dr. SMoshe Ciprowith any questions in this regard, though we are always available to assist with emergencies.   He will return to clinic every 4 weeks for Tysabri infusions and for provider follow up in 6 months.  Patient expressed understanding and was in agreement with this plan. He also understands that He can call his Neurology clinic at any time with any questions, concerns, or complaints.   Dr. CMike Gipwas available for consultation and review of plan of care for this patient.   LEvlyn Kanner NP   10/02/2015 11:02 AM

## 2015-10-02 NOTE — Progress Notes (Signed)
Patient here today as new evaluation for MS and Tysarbi treatment. Referred by Dr. Elwyn Reach @ [facility].  Patient has suprapubic cathether, medication pump (Baclofen).

## 2015-10-15 ENCOUNTER — Ambulatory Visit: Payer: Self-pay | Admitting: Hematology and Oncology

## 2015-10-29 ENCOUNTER — Inpatient Hospital Stay: Payer: Medicare Other | Attending: Family Medicine

## 2015-10-29 VITALS — BP 122/73 | HR 76 | Temp 98.2°F | Resp 20

## 2015-10-29 DIAGNOSIS — Z79899 Other long term (current) drug therapy: Secondary | ICD-10-CM | POA: Insufficient documentation

## 2015-10-29 DIAGNOSIS — G35 Multiple sclerosis: Secondary | ICD-10-CM

## 2015-10-29 MED ORDER — SODIUM CHLORIDE 0.9 % IV SOLN
300.0000 mg | Freq: Once | INTRAVENOUS | Status: AC
  Administered 2015-10-29: 300 mg via INTRAVENOUS
  Filled 2015-10-29: qty 15

## 2015-10-29 MED ORDER — ACETAMINOPHEN 500 MG PO TABS
1000.0000 mg | ORAL_TABLET | Freq: Once | ORAL | Status: AC
  Administered 2015-10-29: 1000 mg via ORAL
  Filled 2015-10-29: qty 2

## 2015-10-29 MED ORDER — SODIUM CHLORIDE 0.9 % IV SOLN
Freq: Once | INTRAVENOUS | Status: AC
  Administered 2015-10-29: 10:00:00 via INTRAVENOUS
  Filled 2015-10-29: qty 1000

## 2015-10-30 ENCOUNTER — Ambulatory Visit: Payer: Medicare Other

## 2015-11-26 ENCOUNTER — Inpatient Hospital Stay: Payer: Medicare Other | Attending: Family Medicine

## 2015-11-27 ENCOUNTER — Ambulatory Visit: Payer: Medicare Other

## 2015-12-24 ENCOUNTER — Inpatient Hospital Stay: Payer: Medicare Other | Attending: Family Medicine

## 2015-12-24 VITALS — BP 128/82 | HR 67 | Temp 96.7°F | Resp 18

## 2015-12-24 DIAGNOSIS — Z79899 Other long term (current) drug therapy: Secondary | ICD-10-CM | POA: Insufficient documentation

## 2015-12-24 DIAGNOSIS — G35 Multiple sclerosis: Secondary | ICD-10-CM | POA: Diagnosis not present

## 2015-12-24 MED ORDER — ACETAMINOPHEN 500 MG PO TABS
1000.0000 mg | ORAL_TABLET | Freq: Once | ORAL | Status: AC
  Administered 2015-12-24: 1000 mg via ORAL
  Filled 2015-12-24: qty 2

## 2015-12-24 MED ORDER — SODIUM CHLORIDE 0.9 % IV SOLN
Freq: Once | INTRAVENOUS | Status: AC
  Administered 2015-12-24: 10:00:00 via INTRAVENOUS
  Filled 2015-12-24: qty 1000

## 2015-12-24 MED ORDER — SODIUM CHLORIDE 0.9 % IV SOLN
300.0000 mg | Freq: Once | INTRAVENOUS | Status: AC
  Administered 2015-12-24: 300 mg via INTRAVENOUS
  Filled 2015-12-24: qty 15

## 2015-12-25 ENCOUNTER — Ambulatory Visit: Payer: Medicare Other

## 2016-01-22 ENCOUNTER — Ambulatory Visit: Payer: Medicare Other

## 2016-01-23 ENCOUNTER — Inpatient Hospital Stay: Payer: Medicare Other | Attending: Family Medicine

## 2016-01-23 DIAGNOSIS — G35 Multiple sclerosis: Secondary | ICD-10-CM | POA: Insufficient documentation

## 2016-01-23 DIAGNOSIS — Z79899 Other long term (current) drug therapy: Secondary | ICD-10-CM | POA: Diagnosis not present

## 2016-01-23 MED ORDER — NATALIZUMAB 300 MG/15ML IV CONC
300.0000 mg | Freq: Once | INTRAVENOUS | Status: AC
  Administered 2016-01-23: 300 mg via INTRAVENOUS
  Filled 2016-01-23: qty 15

## 2016-01-23 MED ORDER — ACETAMINOPHEN 500 MG PO TABS
1000.0000 mg | ORAL_TABLET | Freq: Once | ORAL | Status: AC
  Administered 2016-01-23: 1000 mg via ORAL
  Filled 2016-01-23: qty 2

## 2016-01-23 MED ORDER — SODIUM CHLORIDE 0.9 % IV SOLN
Freq: Once | INTRAVENOUS | Status: AC
  Administered 2016-01-23: 11:00:00 via INTRAVENOUS
  Filled 2016-01-23: qty 1000

## 2016-02-09 DIAGNOSIS — N390 Urinary tract infection, site not specified: Secondary | ICD-10-CM | POA: Insufficient documentation

## 2016-02-19 ENCOUNTER — Ambulatory Visit: Payer: Medicare Other

## 2016-02-20 ENCOUNTER — Other Ambulatory Visit: Payer: Self-pay | Admitting: Oncology

## 2016-02-20 ENCOUNTER — Inpatient Hospital Stay: Payer: Medicare Other | Attending: Family Medicine

## 2016-02-20 VITALS — BP 110/68 | HR 88 | Temp 98.1°F

## 2016-02-20 DIAGNOSIS — G35 Multiple sclerosis: Secondary | ICD-10-CM

## 2016-02-20 DIAGNOSIS — Z79899 Other long term (current) drug therapy: Secondary | ICD-10-CM | POA: Diagnosis not present

## 2016-02-20 MED ORDER — ACETAMINOPHEN 500 MG PO TABS
1000.0000 mg | ORAL_TABLET | Freq: Once | ORAL | Status: AC
  Administered 2016-02-20: 1000 mg via ORAL
  Filled 2016-02-20: qty 2

## 2016-02-20 MED ORDER — SODIUM CHLORIDE 0.9 % IV SOLN
Freq: Once | INTRAVENOUS | Status: AC
  Administered 2016-02-20: 11:00:00 via INTRAVENOUS
  Filled 2016-02-20: qty 1000

## 2016-02-20 MED ORDER — SODIUM CHLORIDE 0.9 % IV SOLN
300.0000 mg | Freq: Once | INTRAVENOUS | Status: AC
  Administered 2016-02-20: 300 mg via INTRAVENOUS
  Filled 2016-02-20: qty 15

## 2016-03-18 ENCOUNTER — Ambulatory Visit: Payer: Medicare Other

## 2016-03-18 NOTE — Progress Notes (Signed)
[facility]  [phone]  [phone]     Jesus Sanchez DOB: 05/27/1951  MR#: [id number]  CSN#:[id number]  No care team member to display  CHIEF COMPLAINT: Multiple Sclerosis  INTERVAL HISTORY:    Patient returns to clinic today for routine 6 month evaluation. He continues to tolerateTysabri for treatment of MS. he currently feels well and is at his baseline. He is confined to a motorized wheelchair and has very limited use of lower extremities. He is capable of transferring but is unable to bear any weight. He does have a Baclofen pump in place. He also has a suprapubic catheter in place for neurogenic bladder. Patient offers no further specific complaints today.  REVIEW OF SYSTEMS:   Review of Systems  Constitutional: Positive for malaise/fatigue. Negative for fever and weight loss.  Respiratory: Negative.  Negative for cough and shortness of breath.   Cardiovascular: Negative.  Negative for chest pain.  Gastrointestinal: Negative.  Negative for abdominal pain.  Genitourinary: Negative.   Musculoskeletal: Negative.   Neurological: Positive for weakness.  Psychiatric/Behavioral: Negative.     As per HPI. Otherwise, a complete review of systems is negatve.   PAST MEDICAL HISTORY: Past Medical History:  Diagnosis Date  . Depression   . Diabetes mellitus without complication (HCC)   . GERD (gastroesophageal reflux disease)   . Hypertension   . MS (multiple sclerosis) (HCC) 10/02/2015  . Neuromuscular disorder (HCC)     PAST SURGICAL HISTORY: No past surgical history on file.  FAMILY HISTORY: Reviewed and unchanged. No reported history of malignancy or chronic disease.  GYNECOLOGIC HISTORY:  No LMP for male patient.     ADVANCED DIRECTIVES:    HEALTH MAINTENANCE: Social History  Substance Use Topics  . Smoking status: Former Smoker    Packs/day: 2.00    Years: 25.00  . Smokeless tobacco: Former User    Quit date: 05/20/1979    . Alcohol use Not on file    Allergies  Allergen Reactions  . Oxycodone-Acetaminophen Itching    Current Outpatient Prescriptions  Medication Sig Dispense Refill  . aspirin EC 81 MG tablet Take 81 mg by mouth.    . atorvastatin (LIPITOR) 10 MG tablet Take 10 mg by mouth.    . baclofen (GABLOFEN) 40000 MCG/20ML SOLN by Intrathecal route.    . citalopram (CELEXA) 40 MG tablet Take 40 mg by mouth.    . gabapentin (NEURONTIN) 300 MG capsule Take 900 mg by mouth. 1200 mg at hs 600mg  in am    . losartan-hydrochlorothiazide (HYZAAR) 100-25 MG tablet     . metFORMIN (GLUCOPHAGE) 500 MG tablet Take by mouth.    . natalizumab (TYSABRI) 300 MG/15ML injection Inject into the vein.    . pantoprazole (PROTONIX) 40 MG tablet Take by mouth.     No current facility-administered medications for this visit.     OBJECTIVE: There were no vitals taken for this visit.   There is no height or weight on file to calculate BMI.    ECOG FS:3 - Symptomatic, >50% confined to bed  General: Well-developed, well-nourished, no acute distress.Sitting in wheelchair. Eyes: Pink conjunctiva, anicteric sclera. HEENT: Normocephalic, moist mucous membranes, clear oropharnyx. Lungs: Clear to auscultation bilaterally. Heart: Regular rate and rhythm. No rubs, murmurs, or gallops. Abdomen: Soft, nontender, nondistended. No organomegaly noted, normoactive bowel sounds. Musculoskeletal: BLE weakness, right> left, No edema, cyanosis, or clubbing. Neuro: Alert, answering all questions appropriately. Cranial nerves are grossly intact. Skin: No rashes or petechiae   noted. Psych: Normal affect.   LAB RESULTS:  No visits with results within 3 Day(s) from this visit.  Latest known visit with results is:  [facility] Conversion on 06/15/2013  Component Date Value Ref Range Status  . Glucose 06/15/2013 102* 65 - 99 mg/dL Final  . BUN 06/15/2013 23* 7 - 18 mg/dL Final  . Creatinine 06/15/2013 1.95* 0.60 - 1.30 mg/dL Final  .  Sodium 06/15/2013 128* 136 - 145 mmol/L Final  . Potassium 06/15/2013 2.8* 3.5 - 5.1 mmol/L Final  . Chloride 06/15/2013 85* 98 - 107 mmol/L Final  . Co2 06/15/2013 33* 21 - 32 mmol/L Final  . Calcium, Total 06/15/2013 9.1  8.5 - 10.1 mg/dL Final  . SGOT(AST) 06/15/2013 37  15 - 37 Unit/L Final  . SGPT (ALT) 06/15/2013 30  12 - 78 U/L Final  . Alkaline Phosphatase 06/15/2013 54  Unit/L Final   Comment: 45-117 NOTE: New Reference Range 06/08/13   . Albumin 06/15/2013 3.6  3.4 - 5.0 g/dL Final  . Total Protein 06/15/2013 7.1  6.4 - 8.2 g/dL Final  . Bilirubin,Total 06/15/2013 0.5  0.2 - 1.0 mg/dL Final  . Osmolality 06/15/2013 261  275 - 301 Final  . Anion Gap 06/15/2013 10  7 - 16 Final  . EGFR (African American) 06/15/2013 42*  Final  . EGFR (Non-African Amer.) 06/15/2013 36*  Final   Comment: eGFR values <63m/min/1.73 m2 may be an indication of chronic kidney disease (CKD). Calculated eGFR is useful in patients with stable renal function. The eGFR calculation will not be reliable in acutely ill patients when serum creatinine is changing rapidly. It is not useful in  patients on dialysis. The eGFR calculation may not be applicable to patients at the low and high extremes of body sizes, pregnant women, and vegetarians.   . WBC 06/15/2013 15.5* 3.8 - 10.6 x10 3/mm 3 Final  . RBC 06/15/2013 4.19* 4.40 - 5.90 x10 6/mm 3 Final  . HGB 06/15/2013 14.2  13.0 - 18.0 g/dL Final  . HCT 06/15/2013 40.9  40.0 - 52.0 % Final  . MCV 06/15/2013 98  80 - 100 fL Final  . MCH 06/15/2013 33.8  26.0 - 34.0 pg Final  . MCHC 06/15/2013 34.6  32.0 - 36.0 g/dL Final  . RDW 06/15/2013 14.3  11.5 - 14.5 % Final  . Platelet 06/15/2013 239  150 - 440 x10 3/mm 3 Final  . Troponin-I 06/15/2013 < 0.02  ng/mL Final   Comment: 0.00-0.05 0.05 ng/mL or less: NEGATIVE  Repeat testing in 3-6 hrs  if clinically indicated. >0.05 ng/mL: POTENTIAL  MYOCARDIAL INJURY. Repeat  testing in 3-6 hrs if  clinically  indicated. NOTE: An increase or decrease  of 30% or more on serial  testing suggests a  clinically important change   . Magnesium 06/15/2013 1.5* mg/dL Final   Comment: 1.8-2.4 THERAPEUTIC RANGE: 4-7 mg/dL TOXIC: > 10 mg/dL  -----------------------   . Color - urine 06/15/2013 Yellow   Final  . Clarity - urine 06/15/2013 Hazy   Final  . GSeward Meth11/28/2014 Negative  0 - 75 mg/dL Final  . Bilirubin,UR 06/15/2013 Negative  NEGATIVE Final  . Ketone 06/15/2013 Negative  NEGATIVE Final  . Specific Gravity 06/15/2013 1.012  1.003 - 1.030 Final  . Blood 06/15/2013 1+  NEGATIVE Final  . Ph 06/15/2013 5.0  4.5 - 8.0 Final  . Protein 06/15/2013 30 mg/dL  NEGATIVE Final  . Nitrite 06/15/2013 Negative  NEGATIVE Final  . Leukocyte Esterase 06/15/2013  Trace  NEGATIVE Final  . RBC,UR 06/15/2013 3 /HPF  0 - 5 /HPF Final  . WBC UR 06/15/2013 12 /HPF  0 - 5 /HPF Final  . Bacteria 06/15/2013 TRACE  NONE SEEN Final  . Squamous Epithelial 06/15/2013 <1 /HPF   Final  . Mucous 06/15/2013 PRESENT   Final  . Hyaline Cast 06/15/2013 21 /LPF   Final  . Micro Text Report 06/17/2013    Final                   Value:   SOURCE: CLEAN CATCH    ORGANISM 1                7000 CFU/ML GRAM NEGATIVE ROD   COMMENT                   -   ANTIBIOTIC                                                      . WBC 06/16/2013 11.7* 3.8 - 10.6 x10 3/mm 3 Final  . RBC 06/16/2013 3.75* 4.40 - 5.90 x10 6/mm 3 Final  . HGB 06/16/2013 13.0  13.0 - 18.0 g/dL Final  . HCT 06/16/2013 36.4* 40.0 - 52.0 % Final  . MCV 06/16/2013 97  80 - 100 fL Final  . MCH 06/16/2013 34.7* 26.0 - 34.0 pg Final  . MCHC 06/16/2013 35.8  32.0 - 36.0 g/dL Final  . RDW 06/16/2013 14.2  11.5 - 14.5 % Final  . Platelet 06/16/2013 199  150 - 440 x10 3/mm 3 Final  . Neutrophil % 06/16/2013 55.3  % Final  . Lymphocyte % 06/16/2013 29.5  % Final  . Monocyte % 06/16/2013 11.1  % Final  . Eosinophil % 06/16/2013 3.5  % Final  . Basophil % 06/16/2013  0.6  % Final  . Neutrophil # 06/16/2013 6.5  1.4 - 6.5 x10 3/mm 3 Final  . Lymphocyte # 06/16/2013 3.4  1.0 - 3.6 x10 3/mm 3 Final  . Monocyte # 06/16/2013 1.3* 0.2 - 1.0 x10 3/mm  Final  . Eosinophil # 06/16/2013 0.4  0.0 - 0.7 x10 3/mm 3 Final  . Basophil # 06/16/2013 0.1  0.0 - 0.1 x10 3/mm 3 Final  . Glucose 06/16/2013 175* 65 - 99 mg/dL Final  . BUN 06/16/2013 26* 7 - 18 mg/dL Final  . Creatinine 06/16/2013 1.39* 0.60 - 1.30 mg/dL Final  . Sodium 06/16/2013 129* 136 - 145 mmol/L Final  . Potassium 06/16/2013 2.9* 3.5 - 5.1 mmol/L Final  . Chloride 06/16/2013 90* 98 - 107 mmol/L Final  . Co2 06/16/2013 32  21 - 32 mmol/L Final  . Calcium, Total 06/16/2013 8.5  8.5 - 10.1 mg/dL Final  . Osmolality 06/16/2013 268  275 - 301 Final  . Anion Gap 06/16/2013 7  7 - 16 Final  . EGFR (African American) 06/16/2013 >60   Final  . EGFR (Non-African Amer.) 06/16/2013 54*  Final   Comment: eGFR values <60mL/min/1.73 m2 may be an indication of chronic kidney disease (CKD). Calculated eGFR is useful in patients with stable renal function. The eGFR calculation will not be reliable in acutely ill patients when serum creatinine is changing rapidly. It is not useful in  patients on dialysis. The eGFR calculation may not be applicable   to patients at the low and high extremes of body sizes, pregnant women, and vegetarians.   . Thyroid Stimulating Horm 06/16/2013 2.03  uIU/mL Final   Comment: 0.45-4.50 (International Unit)  ----------------------- Pregnant patients have  different reference  ranges for TSH:  - - - - - - - - - -  Pregnant, first trimetser:  0.36 - 2.50 uIU/mL   . Magnesium 06/16/2013 1.7* mg/dL Final   Comment: 1.8-2.4 THERAPEUTIC RANGE: 4-7 mg/dL TOXIC: > 10 mg/dL  -----------------------   . Glucose 06/17/2013 137* 65 - 99 mg/dL Final  . BUN 06/17/2013 10  7 - 18 mg/dL Final  . Creatinine 06/17/2013 0.80  0.60 - 1.30 mg/dL Final  . Sodium 06/17/2013 138  136 - 145 mmol/L  Final  . Potassium 06/17/2013 3.2* 3.5 - 5.1 mmol/L Final  . Chloride 06/17/2013 101  98 - 107 mmol/L Final  . Co2 06/17/2013 35* 21 - 32 mmol/L Final  . Calcium, Total 06/17/2013 8.6  8.5 - 10.1 mg/dL Final  . Osmolality 06/17/2013 277  275 - 301 Final  . Anion Gap 06/17/2013 2* 7 - 16 Final  . EGFR (African American) 06/17/2013 >60   Final  . EGFR (Non-African Amer.) 06/17/2013 >60   Final   Comment: eGFR values <37m/min/1.73 m2 may be an indication of chronic kidney disease (CKD). Calculated eGFR is useful in patients with stable renal function. The eGFR calculation will not be reliable in acutely ill patients when serum creatinine is changing rapidly. It is not useful in  patients on dialysis. The eGFR calculation may not be applicable to patients at the low and high extremes of body sizes, pregnant women, and vegetarians.   . WBC 06/17/2013 6.9  3.8 - 10.6 x10 3/mm 3 Final  . RBC 06/17/2013 3.71* 4.40 - 5.90 x10 6/mm 3 Final  . HGB 06/17/2013 13.0  13.0 - 18.0 g/dL Final  . HCT 06/17/2013 36.3* 40.0 - 52.0 % Final  . MCV 06/17/2013 98  80 - 100 fL Final  . MCH 06/17/2013 35.0* 26.0 - 34.0 pg Final  . MCHC 06/17/2013 35.7  32.0 - 36.0 g/dL Final  . RDW 06/17/2013 14.3  11.5 - 14.5 % Final  . Platelet 06/17/2013 181  150 - 440 x10 3/mm 3 Final  . Neutrophil % 06/17/2013 40.9  % Final  . Lymphocyte % 06/17/2013 42.9  % Final  . Monocyte % 06/17/2013 10.0  % Final  . Eosinophil % 06/17/2013 5.5  % Final  . Basophil % 06/17/2013 0.7  % Final  . Neutrophil # 06/17/2013 2.8  1.4 - 6.5 x10 3/mm 3 Final  . Lymphocyte # 06/17/2013 2.9  1.0 - 3.6 x10 3/mm 3 Final  . Monocyte # 06/17/2013 0.7  0.2 - 1.0 x10 3/mm  Final  . Eosinophil # 06/17/2013 0.4  0.0 - 0.7 x10 3/mm 3 Final  . Basophil # 06/17/2013 0.0  0.0 - 0.1 x10 3/mm 3 Final  . Glucose 06/18/2013 140* 65 - 99 mg/dL Final  . BUN 06/18/2013 9  7 - 18 mg/dL Final  . Creatinine 06/18/2013 0.68  0.60 - 1.30 mg/dL Final  . Sodium  06/18/2013 136  136 - 145 mmol/L Final  . Potassium 06/18/2013 3.8  3.5 - 5.1 mmol/L Final  . Chloride 06/18/2013 104  98 - 107 mmol/L Final  . Co2 06/18/2013 27  21 - 32 mmol/L Final  . Calcium, Total 06/18/2013 8.7  8.5 - 10.1 mg/dL Final  . Osmolality 06/18/2013 273  275 - 301 Final  . Anion Gap 06/18/2013 5* 7 - 16 Final  . EGFR (African American) 06/18/2013 >60   Final  . EGFR (Non-African Amer.) 06/18/2013 >60   Final   Comment: eGFR values <25m/min/1.73 m2 may be an indication of chronic kidney disease (CKD). Calculated eGFR is useful in patients with stable renal function. The eGFR calculation will not be reliable in acutely ill patients when serum creatinine is changing rapidly. It is not useful in  patients on dialysis. The eGFR calculation may not be applicable to patients at the low and high extremes of body sizes, pregnant women, and vegetarians.   . WBC 06/18/2013 7.6  3.8 - 10.6 x10 3/mm 3 Final  . RBC 06/18/2013 3.83* 4.40 - 5.90 x10 6/mm 3 Final  . HGB 06/18/2013 13.4  13.0 - 18.0 g/dL Final  . HCT 06/18/2013 37.8* 40.0 - 52.0 % Final  . MCV 06/18/2013 99  80 - 100 fL Final  . MCH 06/18/2013 34.9* 26.0 - 34.0 pg Final  . MCHC 06/18/2013 35.4  32.0 - 36.0 g/dL Final  . RDW 06/18/2013 14.6* 11.5 - 14.5 % Final  . Platelet 06/18/2013 162  150 - 440 x10 3/mm 3 Final  . Neutrophil % 06/18/2013 38.7  % Final  . Lymphocyte % 06/18/2013 45.1  % Final  . Monocyte % 06/18/2013 8.8  % Final  . Eosinophil % 06/18/2013 6.3  % Final  . Basophil % 06/18/2013 1.1  % Final  . Neutrophil # 06/18/2013 3.0  1.4 - 6.5 x10 3/mm 3 Final  . Lymphocyte # 06/18/2013 3.5  1.0 - 3.6 x10 3/mm 3 Final  . Monocyte # 06/18/2013 0.7  0.2 - 1.0 x10 3/mm  Final  . Eosinophil # 06/18/2013 0.5  0.0 - 0.7 x10 3/mm 3 Final  . Basophil # 06/18/2013 0.1  0.0 - 0.1 x10 3/mm 3 Final    STUDIES: No results found.  ASSESSMENT:  Multiple Sclerosis.  PLAN:   1. Multiple sclerosis: Continue Tysabri  every 28 days as prescribed by his Neurologist. Prescription has been received and is scanned into our system. The patient was instructed that the decision about the treatment of multiple sclerosis, the regimen for treatment, and management of side effects is up to the patient's neurologist.  The patient understands and agrees to contact Dr. SMoshe Ciprowith any questions in this regard, though we are always available to assist with emergencies.  He will return to clinic every 4 weeks for Tysabri infusions and for provider follow up in 6 months.  Patient expressed understanding and was in agreement with this plan. He also understands that He can call his Neurology clinic at any time with any questions, concerns, or complaints.    TLloyd Huger MD   03/19/2016 2:40 PM

## 2016-03-19 ENCOUNTER — Inpatient Hospital Stay (HOSPITAL_BASED_OUTPATIENT_CLINIC_OR_DEPARTMENT_OTHER): Payer: Medicare Other | Admitting: Oncology

## 2016-03-19 ENCOUNTER — Inpatient Hospital Stay: Payer: Medicare Other | Attending: Oncology

## 2016-03-19 VITALS — BP 105/75 | HR 85 | Temp 97.0°F | Resp 20

## 2016-03-19 DIAGNOSIS — Z7982 Long term (current) use of aspirin: Secondary | ICD-10-CM | POA: Diagnosis not present

## 2016-03-19 DIAGNOSIS — R531 Weakness: Secondary | ICD-10-CM | POA: Diagnosis not present

## 2016-03-19 DIAGNOSIS — R5383 Other fatigue: Secondary | ICD-10-CM | POA: Insufficient documentation

## 2016-03-19 DIAGNOSIS — E119 Type 2 diabetes mellitus without complications: Secondary | ICD-10-CM | POA: Insufficient documentation

## 2016-03-19 DIAGNOSIS — R5381 Other malaise: Secondary | ICD-10-CM

## 2016-03-19 DIAGNOSIS — Z79899 Other long term (current) drug therapy: Secondary | ICD-10-CM | POA: Insufficient documentation

## 2016-03-19 DIAGNOSIS — Z87891 Personal history of nicotine dependence: Secondary | ICD-10-CM | POA: Diagnosis not present

## 2016-03-19 DIAGNOSIS — G35 Multiple sclerosis: Secondary | ICD-10-CM | POA: Diagnosis not present

## 2016-03-19 DIAGNOSIS — I10 Essential (primary) hypertension: Secondary | ICD-10-CM

## 2016-03-19 DIAGNOSIS — K219 Gastro-esophageal reflux disease without esophagitis: Secondary | ICD-10-CM

## 2016-03-19 DIAGNOSIS — Z993 Dependence on wheelchair: Secondary | ICD-10-CM | POA: Diagnosis not present

## 2016-03-19 MED ORDER — SODIUM CHLORIDE 0.9 % IV SOLN
300.0000 mg | Freq: Once | INTRAVENOUS | Status: AC
  Administered 2016-03-19: 300 mg via INTRAVENOUS
  Filled 2016-03-19 (×2): qty 15

## 2016-03-19 MED ORDER — SODIUM CHLORIDE 0.9 % IV SOLN
Freq: Once | INTRAVENOUS | Status: AC
  Administered 2016-03-19: 11:00:00 via INTRAVENOUS
  Filled 2016-03-19: qty 1000

## 2016-03-19 MED ORDER — ACETAMINOPHEN 500 MG PO TABS
1000.0000 mg | ORAL_TABLET | Freq: Once | ORAL | Status: AC
  Administered 2016-03-19: 1000 mg via ORAL
  Filled 2016-03-19: qty 2

## 2016-03-19 NOTE — Patient Instructions (Signed)
Patient received his Tysabri infusion today. Patient informed and voiced his understanding to contact his neurologist for any questions or side effects from his infusion today.

## 2016-03-19 NOTE — Progress Notes (Signed)
Patient here today for 6 month follow up, Tysabri. Patient seen by Dr. Orlie Dakin in infusion.

## 2016-04-09 ENCOUNTER — Other Ambulatory Visit: Payer: Self-pay

## 2016-04-12 ENCOUNTER — Ambulatory Visit (INDEPENDENT_AMBULATORY_CARE_PROVIDER_SITE_OTHER): Payer: Self-pay | Admitting: General Surgery

## 2016-04-12 ENCOUNTER — Other Ambulatory Visit: Payer: Self-pay | Admitting: Nurse Practitioner

## 2016-04-12 ENCOUNTER — Encounter: Payer: Self-pay | Admitting: General Surgery

## 2016-04-12 VITALS — BP 120/69 | HR 81 | Temp 98.1°F | Ht 69.0 in | Wt 197.0 lb

## 2016-04-12 DIAGNOSIS — K602 Anal fissure, unspecified: Secondary | ICD-10-CM | POA: Insufficient documentation

## 2016-04-12 DIAGNOSIS — R1115 Cyclical vomiting syndrome unrelated to migraine: Secondary | ICD-10-CM

## 2016-04-12 NOTE — Patient Instructions (Signed)
We will call your Nifedipine compound to [facility] at this time. Please pick this up and start this today.  We will see you back in [place] Office in 5 weeks. See your appointment below. If you are having concerns, please call our office so that we can see you in 3 weeks instead.   Increase your water intake up to 72 oz to help stools pass more easily.   You may use a stool softener and/or Miralax to help with stools passing easier.

## 2016-04-12 NOTE — Progress Notes (Signed)
Patient ID: Jesus Sanchez, male   DOB: 1951/03/16, 65 y.o.   MRN: [id number]  CC: Pain on defecation  HPI Jesus BlossomWalter Frank Sanchez is a 65 y.o. male presents to clinic for evaluation of pain on defecation. Patient reports that for the last several months every time he has a bowel movement, there is exquisite pain. He describes it as "pooping glass". On occasion there is blood with wiping and in the toilet. Patient has a significant history of MS and the majority of his care provided by his wife. He was seen by his primary care provider who thought he had hemorrhoidal disease and was started on a steroid suppository. This area suppository is actually worsen the pain and would bring on a bowel movement rather than provide relief. This is never happened before. Patient's wife, who is his primary caregiver, reports that during this process his sphincter tone has become and tighter and tighter. Patient denies any fevers, chills, nausea, vomiting, chest pain, shortness of breath. He does have a known history of constipation and was having 1-2 bowel movements per week prior to this episode. Now has a bowel movement after suppository usage.  HPI  Past Medical History:  Diagnosis Date  . Depression   . Diabetes mellitus without complication (HCC)   . GERD (gastroesophageal reflux disease)   . Hypertension   . MS (multiple sclerosis) (HCC) 10/02/2015  . Neuromuscular disorder [facility](HCC)     Past Surgical History:  Procedure Laterality Date  . HERNIA REPAIR  2014   [facility]- Dr. Egbert GaribaldiBird    Family History  Problem Relation Age of Onset  . Ulcerative colitis Mother   . Diabetes Mother   . Diabetes Sister   . Colon polyps Sister     Social History Social History  Substance Use Topics  . Smoking status: Former Smoker    Packs/day: 2.00    Years: 25.00  . Smokeless tobacco: Former NeurosurgeonUser    Quit date: 05/20/1979  . Alcohol use No    Allergies  Allergen Reactions  . Oxycodone-Acetaminophen Itching     Current Outpatient Prescriptions  Medication Sig Dispense Refill  . aspirin EC 81 MG tablet Take 81 mg by mouth.    Marland Kitchen. atorvastatin (LIPITOR) 10 MG tablet Take 10 mg by mouth.    . baclofen (GABLOFEN) 40000 MCG/20ML SOLN by Intrathecal route.    . baclofen (LIORESAL) 10 MG/5ML SOLN injection by Intrathecal route.    . Bilberry 100 MG CAPS Take 1 capsule by mouth daily.    . citalopram (CELEXA) 40 MG tablet Take by mouth.    . gabapentin (NEURONTIN) 300 MG capsule Take 900 mg by mouth. 1200 mg at hs 600mg   in am    . losartan-hydrochlorothiazide (HYZAAR) 100-25 MG tablet Take by mouth.    . natalizumab (TYSABRI) 300 MG/15ML injection Inject into the vein.    Marland Kitchen. oxybutynin (DITROPAN) 5 MG tablet Take 5 mg by mouth every 8 (eight) hours as needed.     . pantoprazole (PROTONIX) 40 MG tablet Take by mouth.     No current facility-administered medications for this visit.      Review of Systems A Multi-point review of systems was asked and was negative except for the findings documented in the history of present illness  Physical Exam Blood pressure 120/69, pulse 81, temperature 98.1 F (36.7 C), temperature source Oral, height 5\' 9"  (1.753 m), weight 89.4 kg (197 lb). CONSTITUTIONAL: No acute distress. EYES: Pupils are equal, round, and reactive to  light, Sclera are non-icteric. EARS, NOSE, MOUTH AND THROAT: The oropharynx is clear. The oral mucosa is pink and moist. Hearing is intact to voice. LYMPH NODES:  Lymph nodes in the neck are normal. RESPIRATORY:  Lungs are clear. There is normal respiratory effort, with equal breath sounds bilaterally, and without pathologic use of accessory muscles. CARDIOVASCULAR: Heart is regular without murmurs, gallops, or rubs. GI: The abdomen is soft, nontender, and nondistended. There are no palpable masses. There is no hepatosplenomegaly. There are normal bowel sounds in all quadrants. GU: Rectal exam shows an obvious sentinel pile an anal fissure  that is visible with manual spreading. It is in the 12:00 position. There is no active bleeding, no visible or palpable signs of internal hemorrhoids.   MUSCULOSKELETAL: Wheelchair bound with no movement of his lower extremities. No cyanosis or edema.   SKIN: Turgor is good and there are no pathologic skin lesions or ulcers. NEUROLOGIC: Cranial nerves are grossly intact. PSYCH:  Oriented to person, place and time. Affect is normal.  Data Reviewed No images and labs to review I have personally reviewed the patient's imaging, laboratory findings and medical records.    Assessment    Anal fissure    Plan    65 year old male with MS now with an anal fissure. Discussed at length the treatment of this with the patient and his wife. The primary treatment involves allowing relaxation of the sphincter to allow the fissure to heal. We'll provide him today with a prescription for compounded calcium channel blocker, lidocaine, lubricant. This we called in for them today. Discussed appropriate use as well as hygiene. They voice understanding and will follow-up in clinic in 5 weeks or sooner should he fail to progress.     Time spent with the patient was 30 minutes, with more than 50% of the time spent in face-to-face education, counseling and care coordination.     Ricarda Frame, MD FACS General Surgeon 04/12/2016, 2:36 PM

## 2016-04-14 ENCOUNTER — Ambulatory Visit: Payer: Medicare Other

## 2016-04-15 ENCOUNTER — Ambulatory Visit: Payer: Medicare Other

## 2016-04-15 ENCOUNTER — Ambulatory Visit: Payer: Medicare Other | Admitting: Family Medicine

## 2016-04-16 ENCOUNTER — Inpatient Hospital Stay: Payer: Medicare Other

## 2016-04-16 VITALS — BP 117/78 | HR 73 | Temp 97.0°F | Resp 18

## 2016-04-16 DIAGNOSIS — G35 Multiple sclerosis: Secondary | ICD-10-CM | POA: Diagnosis not present

## 2016-04-16 MED ORDER — SODIUM CHLORIDE 0.9 % IV SOLN
300.0000 mg | Freq: Once | INTRAVENOUS | Status: AC
  Administered 2016-04-16: 300 mg via INTRAVENOUS
  Filled 2016-04-16: qty 15

## 2016-04-16 MED ORDER — SODIUM CHLORIDE 0.9 % IV SOLN
Freq: Once | INTRAVENOUS | Status: AC
  Administered 2016-04-16: 12:00:00 via INTRAVENOUS
  Filled 2016-04-16: qty 1000

## 2016-04-16 MED ORDER — ACETAMINOPHEN 500 MG PO TABS
1000.0000 mg | ORAL_TABLET | Freq: Once | ORAL | Status: AC
  Administered 2016-04-16: 1000 mg via ORAL
  Filled 2016-04-16: qty 2

## 2016-04-19 ENCOUNTER — Ambulatory Visit: Payer: Medicare Other

## 2016-05-06 ENCOUNTER — Other Ambulatory Visit: Payer: Self-pay

## 2016-05-14 ENCOUNTER — Inpatient Hospital Stay: Payer: Medicare Other | Attending: Oncology

## 2016-05-14 VITALS — BP 123/84 | HR 76 | Temp 97.0°F | Resp 16

## 2016-05-14 DIAGNOSIS — G35 Multiple sclerosis: Secondary | ICD-10-CM | POA: Insufficient documentation

## 2016-05-14 DIAGNOSIS — Z79899 Other long term (current) drug therapy: Secondary | ICD-10-CM | POA: Insufficient documentation

## 2016-05-14 MED ORDER — ACETAMINOPHEN 500 MG PO TABS
1000.0000 mg | ORAL_TABLET | Freq: Once | ORAL | Status: AC
  Administered 2016-05-14: 1000 mg via ORAL
  Filled 2016-05-14: qty 2

## 2016-05-14 MED ORDER — SODIUM CHLORIDE 0.9 % IV SOLN
Freq: Once | INTRAVENOUS | Status: AC
  Administered 2016-05-14: 12:00:00 via INTRAVENOUS
  Filled 2016-05-14: qty 1000

## 2016-05-14 MED ORDER — SODIUM CHLORIDE 0.9 % IV SOLN
300.0000 mg | Freq: Once | INTRAVENOUS | Status: AC
  Administered 2016-05-14: 300 mg via INTRAVENOUS
  Filled 2016-05-14: qty 15

## 2016-05-17 ENCOUNTER — Ambulatory Visit (INDEPENDENT_AMBULATORY_CARE_PROVIDER_SITE_OTHER): Payer: Medicare Other | Admitting: General Surgery

## 2016-05-17 ENCOUNTER — Encounter: Payer: Self-pay | Admitting: General Surgery

## 2016-05-17 VITALS — BP 136/84 | HR 68 | Temp 98.3°F | Wt 197.0 lb

## 2016-05-17 DIAGNOSIS — K602 Anal fissure, unspecified: Secondary | ICD-10-CM

## 2016-05-17 NOTE — Patient Instructions (Addendum)
We [facility]. They will be contacting you to schedule your appointment.  Your Dialtiazem 2% ointment [facility].  Please give us a call if you have any questions or concerns.

## 2016-05-17 NOTE — Progress Notes (Signed)
Outpatient Surgical Follow Up  05/17/2016  Jesus Sanchez [facility] is an 65 y.o. male.   Chief Complaint  Patient presents with  . Follow-up    Anal Fissure    HPI: 65 year old male returns to clinic for follow-up for an anal fissure. Patient reports that over the last 5 weeks he's been attempting the topical treatment that was provided for him at his last visit. He reports he's had no relief other than the bleeding has stopped. He continues to have pain with every bowel movement that is spasming in nature and tearing in nature. He is having a bowel movement about every 4 days and states it is relatively soft but large. He did not try the stool softeners or laxatives ever recommended his last visit. He continues to have pain to the area. Otherwise, he denies any current fevers, chills, nausea, vomiting, chest pain, shortness breath, diarrhea.  Past Medical History:  Diagnosis Date  . Depression   . Diabetes mellitus without complication (HCC)   . GERD (gastroesophageal reflux disease)   . Hypertension   . MS (multiple sclerosis) (HCC) 10/02/2015  . Neuromuscular disorder [facility])     Past Surgical History:  Procedure Laterality Date  . HERNIA REPAIR  2014   [facility]- Dr. Egbert Garibaldi    Family History  Problem Relation Age of Onset  . Ulcerative colitis Mother   . Diabetes Mother   . Diabetes Sister   . Colon polyps Sister     Social History:  reports that he has quit smoking. He has a 50.00 pack-year smoking history. He quit smokeless tobacco use about 37 years ago. He reports that he does not drink alcohol or use drugs.  Allergies:  Allergies  Allergen Reactions  . Oxycodone-Acetaminophen Itching    Medications reviewed.    ROS A multipoint review of systems was completed, all pertinent positives and negatives are documented in the history of present illness and remainder are negative.   BP 136/84   Pulse 68   Temp 98.3 F (36.8 C) (Oral)   Wt 89.4 kg (197 lb)   BMI  29.09 kg/m   Physical Exam Gen.: No acute distress, wheelchair bound Chest: Clear to auscultation Heart: Regular rhythm Abdomen: Soft and nontender Rectum: Sphincter tone is tight on exam with tenderness in the most cephalad aspect of the sphincter. Unable to visualize of fissure but there is a palpable abnormality. No evidence of blood on exam.    No results found for this or any previous visit (from the past 48 hour(s)). No results found.  Assessment/Plan:  1. 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 Plan to change from nifedipine to diltiazem. Given his persistent symptoms and his other medical problems discussed that we would send him to a rectal specialist. A referral to colorectal surgery will be placed today. Patient voiced understanding and will follow-up with a colorectal surgeon.  A total of 25 minutes was used and this encounter with greater than 50% of the time use for counseling her coordination of care.   Ricarda Frame, MD FACS General Surgeon  05/17/2016,2:34 PM

## 2016-05-21 ENCOUNTER — Telehealth: Payer: Self-pay | Admitting: General Surgery

## 2016-05-21 NOTE — Telephone Encounter (Signed)
Patient advised of appointment per referral to [facility].  05/27/16 @ 10:30am  [facility] [address] [place] [address] [phone]  Dr.Leigh Dimas Aguas

## 2016-06-11 ENCOUNTER — Inpatient Hospital Stay: Payer: Medicare Other

## 2016-06-18 ENCOUNTER — Inpatient Hospital Stay: Payer: Medicare Other | Attending: Family Medicine

## 2016-06-28 ENCOUNTER — Ambulatory Visit
Admission: RE | Admit: 2016-06-28 | Discharge: 2016-06-28 | Disposition: A | Discharge status: Home / Self Care | Payer: Medicare Other | Source: Ambulatory Visit | Attending: Physician Assistant | Admitting: Physician Assistant

## 2016-06-28 ENCOUNTER — Other Ambulatory Visit: Payer: Self-pay | Admitting: Physician Assistant

## 2016-06-28 DIAGNOSIS — R1011 Right upper quadrant pain: Secondary | ICD-10-CM

## 2016-06-28 DIAGNOSIS — K8020 Calculus of gallbladder without cholecystitis without obstruction: Secondary | ICD-10-CM | POA: Insufficient documentation

## 2016-06-28 DIAGNOSIS — K760 Fatty (change of) liver, not elsewhere classified: Secondary | ICD-10-CM

## 2016-06-29 ENCOUNTER — Telehealth: Payer: Self-pay

## 2016-06-29 ENCOUNTER — Emergency Department: Payer: Medicare Other

## 2016-06-29 ENCOUNTER — Encounter: Payer: Self-pay | Admitting: Emergency Medicine

## 2016-06-29 ENCOUNTER — Encounter: Payer: Self-pay | Admitting: Gastroenterology

## 2016-06-29 ENCOUNTER — Other Ambulatory Visit: Payer: Self-pay

## 2016-06-29 ENCOUNTER — Inpatient Hospital Stay
Admission: EM | Admit: 2016-06-29 | Discharge: 2016-07-04 | DRG: 445 | Disposition: A | Discharge status: Home / Self Care | Payer: Medicare Other | Attending: Internal Medicine | Admitting: Internal Medicine

## 2016-06-29 ENCOUNTER — Ambulatory Visit (INDEPENDENT_AMBULATORY_CARE_PROVIDER_SITE_OTHER): Payer: Medicare Other | Admitting: Gastroenterology

## 2016-06-29 VITALS — BP 99/67 | HR 93 | Temp 97.6°F | Ht 69.0 in | Wt 197.0 lb

## 2016-06-29 DIAGNOSIS — Z955 Presence of coronary angioplasty implant and graft: Secondary | ICD-10-CM

## 2016-06-29 DIAGNOSIS — K810 Acute cholecystitis: Secondary | ICD-10-CM | POA: Diagnosis present

## 2016-06-29 DIAGNOSIS — Z960 Presence of urogenital implants: Secondary | ICD-10-CM | POA: Diagnosis present

## 2016-06-29 DIAGNOSIS — Z993 Dependence on wheelchair: Secondary | ICD-10-CM

## 2016-06-29 DIAGNOSIS — I10 Essential (primary) hypertension: Secondary | ICD-10-CM | POA: Diagnosis present

## 2016-06-29 DIAGNOSIS — R7989 Other specified abnormal findings of blood chemistry: Secondary | ICD-10-CM | POA: Diagnosis not present

## 2016-06-29 DIAGNOSIS — K8000 Calculus of gallbladder with acute cholecystitis without obstruction: Secondary | ICD-10-CM | POA: Insufficient documentation

## 2016-06-29 DIAGNOSIS — T83511A Infection and inflammatory reaction due to indwelling urethral catheter, initial encounter: Secondary | ICD-10-CM | POA: Diagnosis not present

## 2016-06-29 DIAGNOSIS — B9689 Other specified bacterial agents as the cause of diseases classified elsewhere: Secondary | ICD-10-CM | POA: Diagnosis not present

## 2016-06-29 DIAGNOSIS — A419 Sepsis, unspecified organism: Secondary | ICD-10-CM

## 2016-06-29 DIAGNOSIS — N319 Neuromuscular dysfunction of bladder, unspecified: Secondary | ICD-10-CM | POA: Diagnosis present

## 2016-06-29 DIAGNOSIS — K819 Cholecystitis, unspecified: Secondary | ICD-10-CM | POA: Diagnosis present

## 2016-06-29 DIAGNOSIS — B952 Enterococcus as the cause of diseases classified elsewhere: Secondary | ICD-10-CM | POA: Diagnosis not present

## 2016-06-29 DIAGNOSIS — Z87891 Personal history of nicotine dependence: Secondary | ICD-10-CM

## 2016-06-29 DIAGNOSIS — E119 Type 2 diabetes mellitus without complications: Secondary | ICD-10-CM | POA: Diagnosis present

## 2016-06-29 DIAGNOSIS — N390 Urinary tract infection, site not specified: Secondary | ICD-10-CM | POA: Diagnosis not present

## 2016-06-29 DIAGNOSIS — I2510 Atherosclerotic heart disease of native coronary artery without angina pectoris: Secondary | ICD-10-CM | POA: Diagnosis present

## 2016-06-29 DIAGNOSIS — Z885 Allergy status to narcotic agent status: Secondary | ICD-10-CM

## 2016-06-29 DIAGNOSIS — Z7984 Long term (current) use of oral hypoglycemic drugs: Secondary | ICD-10-CM | POA: Diagnosis not present

## 2016-06-29 DIAGNOSIS — Z79899 Other long term (current) drug therapy: Secondary | ICD-10-CM

## 2016-06-29 DIAGNOSIS — Z7982 Long term (current) use of aspirin: Secondary | ICD-10-CM | POA: Diagnosis not present

## 2016-06-29 DIAGNOSIS — G35 Multiple sclerosis: Secondary | ICD-10-CM | POA: Diagnosis present

## 2016-06-29 DIAGNOSIS — K760 Fatty (change of) liver, not elsewhere classified: Secondary | ICD-10-CM | POA: Diagnosis present

## 2016-06-29 DIAGNOSIS — R1011 Right upper quadrant pain: Secondary | ICD-10-CM | POA: Diagnosis present

## 2016-06-29 DIAGNOSIS — R945 Abnormal results of liver function studies: Principal | ICD-10-CM

## 2016-06-29 LAB — COMPREHENSIVE METABOLIC PANEL
ALBUMIN: 3.7 g/dL (ref 3.5–5.0)
ALT: 30 U/L (ref 17–63)
ANION GAP: 12 (ref 5–15)
AST: 31 U/L (ref 15–41)
Alkaline Phosphatase: 50 U/L (ref 38–126)
BILIRUBIN TOTAL: 2 mg/dL — AB (ref 0.3–1.2)
BUN: 12 mg/dL (ref 6–20)
CHLORIDE: 93 mmol/L — AB (ref 101–111)
CO2: 27 mmol/L (ref 22–32)
Calcium: 8.6 mg/dL — ABNORMAL LOW (ref 8.9–10.3)
Creatinine, Ser: 0.95 mg/dL (ref 0.61–1.24)
GFR calc Af Amer: >60 mL/min (ref >60)
GLUCOSE: 205 mg/dL — AB (ref 65–99)
POTASSIUM: 3.5 mmol/L (ref 3.5–5.1)
Sodium: 132 mmol/L — ABNORMAL LOW (ref 135–145)
TOTAL PROTEIN: 7.6 g/dL (ref 6.5–8.1)

## 2016-06-29 LAB — CBC WITH DIFFERENTIAL/PLATELET
BAND NEUTROPHILS: 0 %
BASOS PCT: 0 %
BLASTS: 0 %
Basophils Absolute: 0 10*3/uL (ref 0–0.1)
EOS ABS: 0 10*3/uL (ref 0–0.7)
EOS PCT: 0 %
HEMATOCRIT: 45.1 % (ref 40.0–52.0)
Hemoglobin: 15.7 g/dL (ref 13.0–18.0)
LYMPHS ABS: 5.5 10*3/uL — AB (ref 1.0–3.6)
LYMPHS PCT: 23 %
MCH: 33.8 pg (ref 26.0–34.0)
MCHC: 34.9 g/dL (ref 32.0–36.0)
MCV: 97 fL (ref 80.0–100.0)
MONOS PCT: 5 %
Metamyelocytes Relative: 0 %
Monocytes Absolute: 1.2 10*3/uL — ABNORMAL HIGH (ref 0.2–1.0)
Myelocytes: 0 %
NEUTROS ABS: 17.2 10*3/uL — AB (ref 1.4–6.5)
Neutrophils Relative %: 72 %
OTHER: 0 %
PLATELETS: 194 10*3/uL (ref 150–440)
Promyelocytes Absolute: 0 %
RBC: 4.65 MIL/uL (ref 4.40–5.90)
RDW: 13.5 % (ref 11.5–14.5)
WBC: 23.9 10*3/uL — ABNORMAL HIGH (ref 3.8–10.6)
nRBC: 0 /100 WBC

## 2016-06-29 LAB — LACTIC ACID, PLASMA: Lactic Acid, Venous: 2.8 mmol/L (ref 0.5–1.9)

## 2016-06-29 LAB — LIPASE, BLOOD: Lipase: 16 U/L (ref 11–51)

## 2016-06-29 MED ORDER — SODIUM CHLORIDE 0.9 % IV BOLUS (SEPSIS)
1000.0000 mL | Freq: Once | INTRAVENOUS | Status: AC
  Administered 2016-06-29: 1000 mL via INTRAVENOUS

## 2016-06-29 MED ORDER — LOSARTAN POTASSIUM 50 MG PO TABS
100.0000 mg | ORAL_TABLET | Freq: Every day | ORAL | Status: DC
  Administered 2016-07-01 – 2016-07-04 (×4): 100 mg via ORAL
  Filled 2016-06-29 (×4): qty 2

## 2016-06-29 MED ORDER — ASPIRIN EC 81 MG PO TBEC
81.0000 mg | DELAYED_RELEASE_TABLET | Freq: Every day | ORAL | Status: DC
  Administered 2016-07-02 – 2016-07-04 (×3): 81 mg via ORAL
  Filled 2016-06-29 (×3): qty 1

## 2016-06-29 MED ORDER — MORPHINE SULFATE (PF) 4 MG/ML IV SOLN
4.0000 mg | Freq: Once | INTRAVENOUS | Status: AC
  Administered 2016-06-29: 4 mg via INTRAVENOUS

## 2016-06-29 MED ORDER — MORPHINE SULFATE (PF) 4 MG/ML IV SOLN
INTRAVENOUS | Status: AC
  Administered 2016-06-29: 4 mg via INTRAVENOUS
  Filled 2016-06-29: qty 1

## 2016-06-29 MED ORDER — HYDROMORPHONE HCL 1 MG/ML IJ SOLN
1.0000 mg | INTRAMUSCULAR | Status: DC | PRN
  Administered 2016-06-30 – 2016-07-02 (×8): 1 mg via INTRAVENOUS
  Filled 2016-06-29 (×3): qty 1
  Filled 2016-06-29: qty 0.5
  Filled 2016-06-29 (×5): qty 1

## 2016-06-29 MED ORDER — POTASSIUM CHLORIDE IN NACL 40-0.9 MEQ/L-% IV SOLN
INTRAVENOUS | Status: DC
  Administered 2016-06-29 – 2016-07-04 (×9): 100 mL/h via INTRAVENOUS
  Filled 2016-06-29 (×15): qty 1000

## 2016-06-29 MED ORDER — ONDANSETRON HCL 4 MG/2ML IJ SOLN
4.0000 mg | Freq: Once | INTRAMUSCULAR | Status: AC
  Administered 2016-06-29: 4 mg via INTRAVENOUS
  Filled 2016-06-29: qty 2

## 2016-06-29 MED ORDER — ATORVASTATIN CALCIUM 20 MG PO TABS
10.0000 mg | ORAL_TABLET | Freq: Every day | ORAL | Status: DC
  Administered 2016-06-29 – 2016-07-04 (×5): 10 mg via ORAL
  Filled 2016-06-29 (×5): qty 1

## 2016-06-29 MED ORDER — PIPERACILLIN-TAZOBACTAM 3.375 G IVPB 30 MIN
3.3750 g | Freq: Once | INTRAVENOUS | Status: AC
  Administered 2016-06-29: 3.375 g via INTRAVENOUS
  Filled 2016-06-29: qty 50

## 2016-06-29 MED ORDER — NAPROXEN 500 MG PO TABS
500.0000 mg | ORAL_TABLET | Freq: Two times a day (BID) | ORAL | Status: DC | PRN
  Filled 2016-06-29 (×2): qty 1

## 2016-06-29 MED ORDER — ENOXAPARIN SODIUM 40 MG/0.4ML ~~LOC~~ SOLN
40.0000 mg | SUBCUTANEOUS | Status: DC
  Administered 2016-06-29 – 2016-06-30 (×2): 40 mg via SUBCUTANEOUS
  Filled 2016-06-29 (×2): qty 0.4

## 2016-06-29 MED ORDER — GABAPENTIN 300 MG PO CAPS
600.0000 mg | ORAL_CAPSULE | Freq: Every day | ORAL | Status: DC
  Administered 2016-07-01 – 2016-07-04 (×4): 600 mg via ORAL
  Filled 2016-06-29 (×4): qty 2

## 2016-06-29 MED ORDER — CITALOPRAM HYDROBROMIDE 20 MG PO TABS
40.0000 mg | ORAL_TABLET | Freq: Every day | ORAL | Status: DC
  Administered 2016-07-01 – 2016-07-04 (×4): 40 mg via ORAL
  Filled 2016-06-29 (×4): qty 2

## 2016-06-29 MED ORDER — GABAPENTIN 300 MG PO CAPS
900.0000 mg | ORAL_CAPSULE | Freq: Every day | ORAL | Status: DC
  Administered 2016-06-29 – 2016-07-03 (×5): 900 mg via ORAL
  Filled 2016-06-29 (×5): qty 3

## 2016-06-29 MED ORDER — ONDANSETRON 4 MG PO TBDP
4.0000 mg | ORAL_TABLET | Freq: Four times a day (QID) | ORAL | Status: DC | PRN
  Filled 2016-06-29: qty 1

## 2016-06-29 MED ORDER — HYDROCHLOROTHIAZIDE 25 MG PO TABS
25.0000 mg | ORAL_TABLET | Freq: Every day | ORAL | Status: DC
  Administered 2016-07-01 – 2016-07-04 (×4): 25 mg via ORAL
  Filled 2016-06-29 (×4): qty 1

## 2016-06-29 MED ORDER — PIPERACILLIN-TAZOBACTAM 3.375 G IVPB
3.3750 g | Freq: Three times a day (TID) | INTRAVENOUS | Status: AC
  Administered 2016-06-30 – 2016-07-02 (×9): 3.375 g via INTRAVENOUS
  Filled 2016-06-29 (×9): qty 50

## 2016-06-29 MED ORDER — VANCOMYCIN HCL 10 G IV SOLR
1500.0000 mg | Freq: Once | INTRAVENOUS | Status: AC
  Administered 2016-06-29: 1500 mg via INTRAVENOUS
  Filled 2016-06-29: qty 1500

## 2016-06-29 MED ORDER — LOSARTAN POTASSIUM-HCTZ 100-25 MG PO TABS: 1.0000 | ORAL_TABLET | Freq: Every day | ORAL | Status: DC

## 2016-06-29 MED ORDER — ONDANSETRON HCL 4 MG/2ML IJ SOLN
4.0000 mg | Freq: Four times a day (QID) | INTRAMUSCULAR | Status: DC | PRN
  Administered 2016-06-29: 4 mg via INTRAVENOUS

## 2016-06-29 MED ORDER — PANTOPRAZOLE SODIUM 40 MG PO TBEC
40.0000 mg | DELAYED_RELEASE_TABLET | Freq: Two times a day (BID) | ORAL | Status: DC
  Administered 2016-06-29 – 2016-07-04 (×9): 40 mg via ORAL
  Filled 2016-06-29 (×9): qty 1

## 2016-06-29 MED ORDER — OXYBUTYNIN CHLORIDE 5 MG PO TABS
5.0000 mg | ORAL_TABLET | Freq: Three times a day (TID) | ORAL | Status: DC | PRN
  Filled 2016-06-29: qty 1

## 2016-06-29 MED ORDER — MORPHINE SULFATE (PF) 4 MG/ML IV SOLN
4.0000 mg | Freq: Once | INTRAVENOUS | Status: AC
  Administered 2016-06-29: 4 mg via INTRAVENOUS
  Filled 2016-06-29: qty 1

## 2016-06-29 MED ORDER — ONDANSETRON HCL 4 MG/2ML IJ SOLN
INTRAMUSCULAR | Status: AC
  Filled 2016-06-29: qty 2

## 2016-06-29 NOTE — ED Provider Notes (Signed)
[facility] Emergency Department Provider Note  ____________________________________________  Time seen: Approximately 4:09 PM  I have reviewed the triage vital signs and the nursing notes.   HISTORY  Chief Complaint Abdominal Pain   HPI Jesus Sanchez is a 65 y.o. male with a history of multiple sclerosis with a suprapubic Foley catheter last changed 2 weeks ago, diabetes, hypertension who presents for evaluation of elevated white count and a low potassium from GI clinic. Patient was seen by his primary care doctor yesterday for 4 days of right upper quadrant abdominal pain. He had an ultrasound showing sludge and gallstones and was referred to a Careers adviser. Patient thought he was going to see a surgeon today however and that up in the GI doctor's office. That doctor was reviewing patient's lab work done by the PCP and noticed the patient's white count was 17,000 and his potassium was 2.9 and sent patient to the emergency room. Patient continues to endorse pain in the right upper quadrant that is severe, pleuritic, constant for the last 4 days. The pain does not radiate. He hasn't tried anything at home for the pain. Patient voices nausea but no vomiting, no diarrhea or constipation, no fever or chills. According to patient's wife patient was in an excruciating amount of pain earlier this morning before going to the doctor. Patient denies chest pain or shortness of breath, cough. Patient was also found to have a UTI by his PCP yesterday however has not started on antibiotics yet.  Past Medical History:  Diagnosis Date  . Depression   . Diabetes mellitus without complication (HCC)   . GERD (gastroesophageal reflux disease)   . Hypertension   . MS (multiple sclerosis) (HCC) 10/02/2015  . Neuromuscular disorder [facility])     Patient Active Problem List   Diagnosis Date Noted  . Anal fissure 04/12/2016  . Non-intractable cyclical vomiting with nausea 04/12/2016    . Urinary tract infection 02/09/2016  . MS (multiple sclerosis) (HCC) 10/02/2015  . Chronic suprapubic catheter (HCC) 09/12/2015  . History of surgical procedure 03/03/2015  . 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 Obstructive apnea 11/22/2013  . 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 UTI (urinary tract infection) due to urinary indwelling catheter (HCC) 11/22/2013  . Muscle spasticity 11/22/2013  . Balanitis 11/22/2013  . Dysphagia 11/20/2013  . Anasarca 06/10/2013  . 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 Debility 09/21/2012  . Skin ulcer of knee (HCC) 05/31/2012  . Pressure ulcer, sacrum 05/31/2012  . Arterial insufficiency with ischemic ulcer (HCC) 05/31/2012  . Type 2 diabetes mellitus (HCC) 12/01/2011  . Arteriosclerosis of coronary artery 12/01/2011  . Hypertension 12/01/2011  . Hyperlipidemia 12/01/2011    Past Surgical History:  Procedure Laterality Date  . HERNIA REPAIR  2014   [facility]- Dr. Egbert Garibaldi    Prior to Admission medications   Medication Sig Start Date End Date Taking? Authorizing Provider  aspirin EC 81 MG tablet Take 81 mg by mouth.    Historical Provider, MD  baclofen (GABLOFEN) 40000 MCG/20ML SOLN by Intrathecal route. 07/07/15   Historical Provider, MD  baclofen (LIORESAL) 10 MG/5ML SOLN injection by Intrathecal route. 02/16/16   Historical Provider, MD  Bilberry 100 MG CAPS Take 1 capsule by mouth daily.    Historical Provider, MD  cefUROXime (CEFTIN) 500 MG tablet Take by mouth. 06/29/16 07/09/16  Historical Provider,  MD  citalopram (CELEXA) 40 MG tablet Take by mouth. 03/03/16   Historical Provider, MD  gabapentin (NEURONTIN) 300 MG capsule Take 900 mg by mouth. 1200 mg at hs 600mg   in am 11/29/13   Historical Provider, MD  losartan-hydrochlorothiazide (HYZAAR) 100-25 MG tablet Take by  mouth. 05/26/15 05/25/16  Historical Provider, MD  natalizumab (TYSABRI) 300 MG/15ML injection Inject into the vein. 09/19/15 09/18/16  Historical Provider, MD  oxybutynin (DITROPAN) 5 MG tablet Take 5 mg by mouth every 8 (eight) hours as needed.  08/19/15   Historical Provider, MD  pantoprazole (PROTONIX) 40 MG tablet Take by mouth. 08/27/15   Historical Provider, MD    Allergies Oxycodone-acetaminophen  Family History  Problem Relation Age of Onset  . Ulcerative colitis Mother   . Diabetes Mother   . Diabetes Sister   . Colon polyps Sister     Social History Social History  Substance Use Topics  . Smoking status: Former Smoker    Packs/day: 2.00    Years: 25.00  . Smokeless tobacco: Former NeurosurgeonUser    Quit date: 05/20/1979  . Alcohol use No    Review of Systems  Constitutional: Negative for fever. Eyes: Negative for visual changes. ENT: Negative for sore throat. Neck: No neck pain  Cardiovascular: Negative for chest pain. Respiratory: Negative for shortness of breath. Gastrointestinal: + RUQ abdominal pain. No vomiting or diarrhea. Genitourinary: Negative for dysuria. Musculoskeletal: Negative for back pain. Skin: Negative for rash. Neurological: Negative for headaches, weakness or numbness. Psych: No SI or HI  ____________________________________________   PHYSICAL EXAM:  VITAL SIGNS: ED Triage Vitals  Enc Vitals Group     BP 06/29/16 1504 (!) 117/101     Pulse Rate 06/29/16 1504 90     Resp 06/29/16 1504 20     Temp 06/29/16 1504 97.8 F (36.6 C)     Temp Source 06/29/16 1504 Oral     SpO2 06/29/16 1504 95 %     Weight 06/29/16 1505 190 lb (86.2 kg)     Height 06/29/16 1505 5\' 9"  (1.753 m)     Head Circumference --      Peak Flow --      Pain Score 06/29/16 1518 7     Pain Loc --      Pain Edu? --      Excl. in GC? --     Constitutional: Alert and oriented. Well appearing and in no apparent distress. HEENT:      Head: Normocephalic and atraumatic.          Eyes: Conjunctivae are normal. Sclera is non-icteric. EOMI. PERRL      Mouth/Throat: Mucous membranes are moist.       Neck: Supple with no signs of meningismus. Cardiovascular: Regular rate and rhythm. No murmurs, gallops, or rubs. 2+ symmetrical distal pulses are present in all extremities. No JVD. Respiratory: Normal respiratory effort. Lungs are clear to auscultation bilaterally. No wheezes, crackles, or rhonchi.  Gastrointestinal: Soft, mild ttp over the RUQ, negative Murphy's sign, and non distended with positive bowel sounds. No rebound or guarding. Genitourinary: No CVA tenderness. suprapubic catheter with no evidence of surrounding erythema  Musculoskeletal: Nontender with normal range of motion in all extremities. No edema, cyanosis, or erythema of extremities. Neurologic: Normal speech and language. Face is symmetric. Moving all extremities. No gross focal neurologic deficits are appreciated. Skin: Skin is warm, dry and intact. No rash noted.  Psychiatric: Mood and affect are normal. Speech and behavior are normal.  ____________________________________________   LABS (all labs ordered are listed, but only abnormal results are displayed)  Labs Reviewed  CBC WITH DIFFERENTIAL/PLATELET - Abnormal; Notable for the following:       Result Value   WBC 23.9 (*)    Neutro Abs 17.2 (*)    Lymphs Abs 5.5 (*)    Monocytes Absolute 1.2 (*)    All other components within normal limits  COMPREHENSIVE METABOLIC PANEL - Abnormal; Notable for the following:    Sodium 132 (*)    Chloride 93 (*)    Glucose, Bld 205 (*)    Calcium 8.6 (*)    Total Bilirubin 2.0 (*)    All other components within normal limits  LACTIC ACID, PLASMA - Abnormal; Notable for the following:    Lactic Acid, Venous 2.8 (*)    All other components within normal limits  URINE CULTURE  LIPASE, BLOOD  LACTIC ACID, PLASMA  URINALYSIS, COMPLETE (UACMP) WITH MICROSCOPIC    ____________________________________________  EKG  ED ECG REPORT I, Nita Sickle, the attending physician, personally viewed and interpreted this ECG.  Normal sinus rhythm, rate of 89, normal intervals, normal axis, no ST elevations or depressions. Normal EKG. ____________________________________________  RADIOLOGY  [id number] left base atelectasis. No radiographic evidence for acute cardiopulmonary abnormality  RUQ [place]: 1. Gallstones, gallbladder sludge and mild gallbladder wall thickening with pericholecystic fluid. Cannot rule out cholecystitis. If further imaging is clinically indicated a nuclear medicine hepatobiliary scan may be helpful to assess patency of the cystic duct. 2. Hepatic steatosis.  ____________________________________________   PROCEDURES  Procedure(s) performed: None Procedures Critical Care performed:  Yes  CRITICAL CARE Performed by: Nita Sickle  ?  Total critical care time: 35 min  Critical care time was exclusive of separately billable procedures and treating other patients.  Critical care was necessary to treat or prevent imminent or life-threatening deterioration.  Critical care was time spent personally by me on the following activities: development of treatment plan with patient and/or surrogate as well as nursing, discussions with consultants, evaluation of patient's response to treatment, examination of patient, obtaining history from patient or surrogate, ordering and performing treatments and interventions, ordering and review of laboratory studies, ordering and review of radiographic studies, pulse oximetry and re-evaluation of patient's condition.  ____________________________________________   INITIAL IMPRESSION / ASSESSMENT AND PLAN / ED COURSE  65 y.o. male with a history of multiple sclerosis with a suprapubic Foley catheter last changed 2 weeks ago, diabetes, hypertension who presents for evaluation of elevated  white count and a low potassium from GI clinic and 4 days of RUQ abdominal pain with US done yesterday showing stones and sludge but no evidence of cholecystitis. Patient continues to complain of pain and has mild tenderness in the right upper quadrant region with no rebound or guarding, negative Murphy sign. We'll repeat blood work to evaluate his potassium and white cells. We'll get a chest x-ray to rule out alternative cause of right upper quadrant pain such as a lower lobe pneumonia. We'll discuss patient with surgery team to see if they be willing to evaluate patient for surgery during this admission before repeating US. Will give IVF, IV morphine and zofran.   Clinical Course as of Jun 29 1858  Tue Jun 29, 2016  0981 [place] concerning for cholecystitis. Labs showing WBC 23.9 and lactate of 2.8. Patient received 2L NS and zosyn. Will consult surgery for admission.  [CV]    Clinical Course User Index [CV] Nita Sickle, MD  Patient also received vancomycin for catheter associated UTI.    Pertinent labs & imaging results that were available during my care of the patient were reviewed by me and considered in my medical decision making (see chart for details).    ____________________________________________   FINAL CLINICAL IMPRESSION(S) / ED DIAGNOSES  Final diagnoses:  RUQ abdominal pain  Acute cholecystitis  Urinary tract infection associated with catheterization of urinary tract, unspecified indwelling urinary catheter type, initial encounter (HCC)  Sepsis, due to unspecified organism [facility])      NEW MEDICATIONS STARTED DURING THIS VISIT:  New Prescriptions   No medications on file     Note:  This document was prepared using Dragon voice recognition software and may include unintentional dictation errors.    Nita Sickle, MD 06/29/16 [phone]

## 2016-06-29 NOTE — H&P (Signed)
Jesus Sanchez is a 65 y.o. male  with abdominal pain for approximately 4 days.  HPI: He is a very pleasant gentleman with severe multiple sclerosis who denies any previous significant GI problems. 4 days ago he develops right upper quadrant pain associated with some anorexia but no nausea vomiting fever or chills. The pain was intermittent. He was affected by position and deep breathing. The pain worsened over the weekend and he presented to his primary care physician on Monday. They suggested he might have biliary tract disease and arranged for a ultrasound evaluation. Ultrasound revealed multiple stones possible sludge and a possible mild gallbladder wall thickening. He was referred to the GI service today. He was felt to be improving and they did not feel that he had symptoms consistent with need for mesh in the hospital. However because of the persistence of his symptoms he was referred to the emergency room for further evaluation.  He was noted have an elevated white blood cell count in the emergency room with a slightly elevated bilirubin. Repeat ultrasound revealed increasing gallbladder wall thickness consistent with acute cholecystitis. The surgical service was consulted.  He denies any previous history of GI problems. Specifically he denies history of hepatitis, yellow jaundice, pancreatitis, peptic ulcer disease, previous diagnosis of gallbladder disease, or diverticulitis. He's not had any previous abdominal surgery other than abdominal hernia repair. 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 He has had a negative colonoscopy in the last 5 years.  He denies any regular nausea or vomiting anorexia. He does have serious problems with heartburn and reflux. He was recently increased to twice a day medication for his reflux symptoms.  He does not have any cardiac disease but is mildly hypertension and non-insulin-dependent  diabetic. [facility] He is wheelchair dependent.  Past Medical History:  Diagnosis Date  . Depression   . Diabetes mellitus without complication (HCC)   . GERD (gastroesophageal reflux disease)   . Hypertension   . MS (multiple sclerosis) (HCC) 10/02/2015  . Neuromuscular disorder [facility](HCC)    Past Surgical History:  Procedure Laterality Date  . HERNIA REPAIR  2014   [facility]- Dr. Egbert GaribaldiBird   Social History   Social History  . Marital status: Married    Spouse name: N/A  . Number of children: N/A  . Years of education: N/A   Social History Main Topics  . Smoking status: Former Smoker    Packs/day: 2.00    Years: 25.00  . Smokeless tobacco: Former NeurosurgeonUser    Quit date: 05/20/1979  . Alcohol use No  . Drug use: No  . Sexual activity: Not Asked   Other Topics Concern  . None   Social History Narrative  . None     Review of Systems  Constitutional: Positive for chills, diaphoresis and malaise/fatigue. Negative for fever.  HENT: Negative.   Eyes: Negative.   Respiratory: Positive for shortness of breath. Negative for cough, hemoptysis, sputum production and wheezing.   Cardiovascular: Negative for chest pain, palpitations and orthopnea.  Gastrointestinal: Positive for abdominal pain, heartburn and nausea. Negative for diarrhea and vomiting.  Genitourinary:       He is not having urinary symptoms with his neurogenic bladder. However he does have chronic urinalysis consistent with infection.  Musculoskeletal: Negative for back pain, joint pain and myalgias.  Skin: Negative.   Neurological: Positive for tremors, sensory change, speech change and focal weakness.  Negative for dizziness, tingling and headaches.  Psychiatric/Behavioral: Negative for depression, hallucinations, substance abuse and suicidal ideas.     PHYSICAL EXAM: BP (!) 114/97   Pulse 95   Temp 97.8 F (36.6 C) (Oral)   Resp 17   Ht 5\' 9"  (1.753 m)   Wt 86.2 kg (190  lb)   SpO2 94%   BMI 28.06 kg/m   Physical Exam  Constitutional: He is oriented to person, place, and time. He appears well-developed and well-nourished. No distress.  HENT:  Head: Normocephalic and atraumatic.  Eyes: EOM are normal. Pupils are equal, round, and reactive to light.  Neck: Normal range of motion. Neck supple.  Cardiovascular: Normal rate, regular rhythm and normal heart sounds.   No murmur heard. Pulmonary/Chest: Effort normal and breath sounds normal.  Mild abdominal pain with deep breathing  Abdominal: Bowel sounds are normal. He exhibits distension. He exhibits no mass. There is tenderness. There is guarding.  Genitourinary:  Genitourinary Comments: Suprapubic catheter  Musculoskeletal: He exhibits edema and deformity.  Neurological: He is alert and oriented to person, place, and time.  Significant neurologic deficit secondary to his multiple sclerosis  Skin: Skin is warm. He is diaphoretic.  Does have skin irritation consistent with his known decubitus  Psychiatric: He has a normal mood and affect. His behavior is normal. Thought content normal.   His abdomen is mildly distended and full in the right upper quadrant. I can't specifically palpate his gallbladder. He does have marked abdominal tenderness and mild guarding in that area with no rebound.  Impression/Plan: Slight blood cell count is nearly 24,000. His bilirubins 2 with normal liver function studies otherwise. His ultrasound demonstrates gallbladder wall thickening stones and sludge consistent with possible acute cholecystitis.  I think this gentleman does have acute cholecystitis. We'll planned omitting the hospital place him on IV antibiotics ask internal medicine see him with regard to his multiple medical problems and tentatively plan surgery in the next 24-48 hours. This plans been discussed with the patient and his wife and they are in agreement.   Tiney Rouge III, MD  06/29/2016, 8:07 PM

## 2016-06-29 NOTE — ED Triage Notes (Signed)
Pt to ed with c/o abd pain and possibility for surgery for gallstones.  Pt states he was seen at GI md today but was sent here for low potassium, 2.9.  Pt also with WBC of 17.8.

## 2016-06-29 NOTE — ED Notes (Signed)
Pt transported to room 113 

## 2016-06-29 NOTE — Telephone Encounter (Signed)
We are seeing this patient urgently at request of [facility] internal medicine. Upon reviewing patient's chart, it was noticed that potassium is 2.9. A call was made to [employer] nurse whom I wanted to ensure that this did not get missed. Spoke with Bourbon during call. She has placed a message to provider and will call back if needed. She will make sure that potassium is addressed.

## 2016-06-29 NOTE — ED Notes (Signed)
ED Provider at bedside. 

## 2016-06-29 NOTE — Progress Notes (Signed)
Gastroenterology Consultation  Referring Provider:     Lauro Regulus, MD Primary Care Physician:  Lauro Regulus., MD Primary Gastroenterologist:  Dr. Wyline Mood  Reason for Consultation:     RUQ pain         HPI:   Jesus Sanchez is a 65 y.o. y/o male referred for consultation & management  by Dr. Lauro Regulus., MD.    He was seen yesterday by his Physician Dr Mel Almond for 3 days of RUQ pain. USG RUQ showed gall bladder sludge , no pericholecystic fluid , negative murphy's sign. CBD 4.2 mm .  Labs 06/28/16 WCC 17.8 ,hb 16. Tbil 2.0 , Potassium 2.9 . AST,ALT normal  UA from yesterday showed large leucocyte esterase  Abdominal pain: Onset: 4 days back, felt like indigestion initially , worse when he takes a breath, not worse when he eats, never had this pain before, Points to the side of his abdomen on the right side. Denies shortness of breath.This morning was 99.9, some sweating today  Radiation: localized Severity :8/10  Nature of pain: sharp in nature Aggravating factors: turning over  Relieving factors :doing nothing  Weight loss: may be some  NSAID use: no  PPI use :protonix for reflux,given for nausea.  Gall bladder surgery: none  Change in bowel movements: no  Relief with bowel movements: no   The patient and his wife were under the impression that he would be seeing a surgeon today and was going to have his gall bladder taken out.     Past Medical History:  Diagnosis Date  . Depression   . Diabetes mellitus without complication (HCC)   . GERD (gastroesophageal reflux disease)   . Hypertension   . MS (multiple sclerosis) (HCC) 10/02/2015  . Neuromuscular disorder [facility])     Past Surgical History:  Procedure Laterality Date  . HERNIA REPAIR  2014   [facility]- Dr. Egbert Garibaldi    Prior to Admission medications   Medication Sig Start Date End Date Taking? Authorizing Provider  aspirin EC 81 MG tablet Take 81 mg by mouth.   Yes Historical  Provider, MD  baclofen (GABLOFEN) 40000 MCG/20ML SOLN by Intrathecal route. 07/07/15  Yes Historical Provider, MD  baclofen (LIORESAL) 10 MG/5ML SOLN injection by Intrathecal route. 02/16/16  Yes Historical Provider, MD  Bilberry 100 MG CAPS Take 1 capsule by mouth daily.   Yes Historical Provider, MD  cefUROXime (CEFTIN) 500 MG tablet Take by mouth. 06/29/16 07/09/16 Yes Historical Provider, MD  citalopram (CELEXA) 40 MG tablet Take by mouth. 03/03/16  Yes Historical Provider, MD  gabapentin (NEURONTIN) 300 MG capsule Take 900 mg by mouth. 1200 mg at hs 600mg   in am 11/29/13  Yes Historical Provider, MD  natalizumab (TYSABRI) 300 MG/15ML injection Inject into the vein. 09/19/15 09/18/16 Yes Historical Provider, MD  oxybutynin (DITROPAN) 5 MG tablet Take 5 mg by mouth every 8 (eight) hours as needed.  08/19/15  Yes Historical Provider, MD  pantoprazole (PROTONIX) 40 MG tablet Take by mouth. 08/27/15  Yes Historical Provider, MD  losartan-hydrochlorothiazide (HYZAAR) 100-25 MG tablet Take by mouth. 05/26/15 05/25/16  Historical Provider, MD    Family History  Problem Relation Age of Onset  . Ulcerative colitis Mother   . Diabetes Mother   . Diabetes Sister   . Colon polyps Sister      Social History  Substance Use Topics  . Smoking status: Former Smoker    Packs/day: 2.00    Years: 25.00  . Smokeless tobacco:  Former NeurosurgeonUser    Quit date: 05/20/1979  . Alcohol use No    Allergies as of 06/29/2016 - Review Complete 06/29/2016  Allergen Reaction Noted  . Oxycodone-acetaminophen Itching 10/02/2015    Review of Systems:    All systems reviewed and negative except where noted in HPI.   Physical Exam:  BP 99/67   Pulse 93   Temp 97.6 F (36.4 C) (Oral)   Ht 5\' 9"  (1.753 m)   Wt 197 lb (89.4 kg)   BMI 29.09 kg/m  No LMP for male patient. Psych:  Alert and cooperative. Normal mood and affect.Sitting in wheel chair  General:   Alert,  Well-developed, pleasant and cooperative in NAD Head:   Normocephalic and atraumatic. Eyes:  Sclera clear, no icterus.   Conjunctiva pink. Ears:  Normal auditory acuity. Nose:  No deformity, discharge, or lesions. Mouth:  No deformity or lesions,oropharynx pink & moist. Neck:  Supple; no masses or thyromegaly. Lungs:  Respirations even and unlabored.  Clear throughout to auscultation.   No wheezes, crackles, or rhonchi. No acute distress. Heart:  Regular rate and rhythm; no murmurs, clicks, rubs, or gallops. Abdomen:  Normal bowel sounds.  No bruits.  Soft, mild RUQ tenderness over a single point and non-distended without masses, hepatosplenomegaly or hernias noted.  No guarding or rebound tenderness.    Psych:  Alert and cooperative. Normal mood and affect.  Imaging Studies: [place] Abdomen Complete  Result Date: 06/28/2016 CLINICAL DATA:  Right upper quadrant abdominal pain EXAM: ABDOMEN ULTRASOUND COMPLETE COMPARISON:  None. FINDINGS: Gallbladder: Well distended with evidence of gallbladder sludge as well as gallbladder calculi. No wall thickening or pericholecystic fluid is noted. Negative sonographic Eulah PontMurphy sign is elicited. Common bile duct: Diameter: 4.2 mm. Liver: Diffusely increased in echogenicity likely related to fatty infiltration. IVC: Not well seen Pancreas: Not well seen Spleen: Size and appearance within normal limits. Right Kidney: Somewhat obscured by overlying bowel gas. No definitive obstructive change is noted. Left Kidney: Length: 10.6 cm. Somewhat obscured by overlying bowel gas. No obstructive changes are seen. Abdominal aorta: No aneurysm visualized. Other findings: None. IMPRESSION: Fatty liver. Cholelithiasis and gallbladder sludge. Electronically Signed   By: Alcide CleverMark  Lukens M.D.   On: 06/28/2016 17:32    Assessment and Plan:   Jesus Sanchez is a 65 y.o. y/o male has been referred for RUQ pain . The pain is acute does not sound biliary by description . RUQ USG shows gall stones but no evidence of cholecystitis. I explained  the pain is likely musculoskeletal and I do not feel he needs his gall bladder taken out urgently. I think his leucocytosis is from an UTI, he has not made much urine per wife who is a Engineer, civil (consulting)nurse, has a very low  Potassium yesterday and did not have any replacement so far  . Am unable to explain the slightly elevated T bilirubin at 2.0 . Family wanted him to be seen by a surgeon and actually thought they were today at this visit. We have helped arrange for surgery to see him tomorrow  Plan 1. Low potassium - needs replacement -will contact his primary care physician to help replace and monitor as needed for the low potassium  2. UTI-likely cause of elevated white cell count. No evidence of cholecystitis 3. RUQ pain- likely non biliary  4. Repeat LFT's and have arranged surgery appointment tomorrow.   Dr Wyline MoodKiran Julee Stoll MD    Addendum :  3.30 pm I was informed the patient and  wife went to the ER for the low potassium to have it replaced

## 2016-06-30 DIAGNOSIS — T83511A Infection and inflammatory reaction due to indwelling urethral catheter, initial encounter: Secondary | ICD-10-CM

## 2016-06-30 DIAGNOSIS — N390 Urinary tract infection, site not specified: Secondary | ICD-10-CM

## 2016-06-30 LAB — GLUCOSE, CAPILLARY
Glucose-Capillary: 182 mg/dL — ABNORMAL HIGH (ref 65–99)
Glucose-Capillary: 197 mg/dL — ABNORMAL HIGH (ref 65–99)
Glucose-Capillary: 225 mg/dL — ABNORMAL HIGH (ref 65–99)

## 2016-06-30 LAB — COMPREHENSIVE METABOLIC PANEL
ALT: 29 U/L (ref 17–63)
ANION GAP: 7 (ref 5–15)
AST: 37 U/L (ref 15–41)
Albumin: 3.1 g/dL — ABNORMAL LOW (ref 3.5–5.0)
Alkaline Phosphatase: 46 U/L (ref 38–126)
BILIRUBIN TOTAL: 1.7 mg/dL — AB (ref 0.3–1.2)
BUN: 16 mg/dL (ref 6–20)
CO2: 29 mmol/L (ref 22–32)
Calcium: 7.7 mg/dL — ABNORMAL LOW (ref 8.9–10.3)
Chloride: 99 mmol/L — ABNORMAL LOW (ref 101–111)
Creatinine, Ser: 1.17 mg/dL (ref 0.61–1.24)
GFR calc Af Amer: >60 mL/min (ref >60)
Glucose, Bld: 226 mg/dL — ABNORMAL HIGH (ref 65–99)
POTASSIUM: 3.6 mmol/L (ref 3.5–5.1)
Sodium: 135 mmol/L (ref 135–145)
TOTAL PROTEIN: 6.7 g/dL (ref 6.5–8.1)

## 2016-06-30 LAB — CBC
HEMATOCRIT: 40.3 % (ref 40.0–52.0)
HEMOGLOBIN: 14.1 g/dL (ref 13.0–18.0)
MCH: 34 pg (ref 26.0–34.0)
MCHC: 34.9 g/dL (ref 32.0–36.0)
MCV: 97.5 fL (ref 80.0–100.0)
Platelets: 187 10*3/uL (ref 150–440)
RBC: 4.14 MIL/uL — ABNORMAL LOW (ref 4.40–5.90)
RDW: 14 % (ref 11.5–14.5)
WBC: 19.8 10*3/uL — AB (ref 3.8–10.6)

## 2016-06-30 MED ORDER — INSULIN ASPART 100 UNIT/ML ~~LOC~~ SOLN: 0.0000 [IU] | Freq: Every day | SUBCUTANEOUS | Status: DC

## 2016-06-30 MED ORDER — INSULIN ASPART 100 UNIT/ML ~~LOC~~ SOLN
0.0000 [IU] | Freq: Three times a day (TID) | SUBCUTANEOUS | Status: DC
  Administered 2016-06-30 – 2016-07-01 (×2): 3 [IU] via SUBCUTANEOUS
  Administered 2016-07-01 – 2016-07-03 (×6): 2 [IU] via SUBCUTANEOUS
  Administered 2016-07-03: 09:00:00 1 [IU] via SUBCUTANEOUS
  Administered 2016-07-03: 2 [IU] via SUBCUTANEOUS
  Filled 2016-06-30 (×3): qty 2
  Filled 2016-06-30: qty 3
  Filled 2016-06-30: qty 2
  Filled 2016-06-30: qty 1
  Filled 2016-06-30 (×2): qty 2
  Filled 2016-06-30: qty 3
  Filled 2016-06-30: qty 2

## 2016-06-30 NOTE — Progress Notes (Signed)
Inpatient Diabetes Program Recommendations  AACE/ADA: New Consensus Statement on Inpatient Glycemic Control (2015)  Target Ranges:  Prepandial:   less than 140 mg/dL      Peak postprandial:   less than 180 mg/dL (1-2 hours)      Critically ill patients:  140 - 180 mg/dL   Results for DELMUS, SCHLENDER (MRN [id number]) as of 06/30/2016 13:54  Ref. Range 06/29/2016 15:25 06/30/2016 04:18  Glucose Latest Ref Range: 65 - 99 mg/dL 956 (H) 213 (H)   Review of Glycemic Control  Diabetes history: DM2 Outpatient Diabetes medications: Metformin 1000 mg BID Current orders for Inpatient glycemic control: None  Inpatient Diabetes Program Recommendations: Correction (SSI): Please use Glycemic Control order set to order CBGs and Novolog correction scale Q4H while NPO (and change to ACHS when diet resumed). HgbA1C: Please order an A1C to evaluate glycemic control over the past 2-3 months.  Thanks, Orlando Penner, RN, MSN, CDE Diabetes Coordinator Inpatient Diabetes Program [phone] (Team Pager from 8am to 5pm)

## 2016-06-30 NOTE — Consult Note (Signed)
Reason for Consult:  Chief Complaint  Patient presents with  . Abdominal Pain   Referring Physician: DR.Ely  Jesus Sanchez is an 65 y.o. male.  HPI: Patient came in with abdominal pain for 4 days and got admitted to the surgical service for acute cholecystitis. Currently nothing by mouth for possible cholecystectomy today. Hospitalist team is consulted for medical management. During my examination patient is resting comfortably and just received pain medication. Denies any pain. Wife at bedside.  Past Medical History:  Diagnosis Date  . Depression   . Diabetes mellitus without complication ([place])   . GERD (gastroesophageal reflux disease)   . Hypertension   . MS (multiple sclerosis) ([place]) 10/02/2015  . Neuromuscular disorder [facility])     Past Surgical History:  Procedure Laterality Date  . HERNIA REPAIR  2014   [facility]- Dr. Marina Gravel    Family History  Problem Relation Age of Onset  . Ulcerative colitis Mother   . Diabetes Mother   . Diabetes Sister   . Colon polyps Sister     Social History:  reports that he has quit smoking. He has a 50.00 pack-year smoking history. He quit smokeless tobacco use about 37 years ago. He reports that he does not drink alcohol or use drugs.  Allergies:  Allergies  Allergen Reactions  . Oxycodone-Acetaminophen Itching    Medications: I have reviewed the patient's current medications.  Results for orders placed or performed during the hospital encounter of 06/29/16 (from the past 48 hour(s))  CBC with Differential/Platelet     Status: Abnormal   Collection Time: 06/29/16  3:25 PM  Result Value Ref Range   WBC 23.9 (H) 3.8 - 10.6 K/uL   RBC 4.65 4.40 - 5.90 MIL/uL   Hemoglobin 15.7 13.0 - 18.0 g/dL   HCT 45.1 40.0 - 52.0 %   MCV 97.0 80.0 - 100.0 fL   MCH 33.8 26.0 - 34.0 pg   MCHC 34.9 32.0 - 36.0 g/dL   RDW 13.5 11.5 - 14.5 %   Platelets 194 150 - 440 K/uL   Neutrophils Relative % 72 %   Lymphocytes Relative 23 %   Monocytes  Relative 5 %   Eosinophils Relative 0 %   Basophils Relative 0 %   Band Neutrophils 0 %   Metamyelocytes Relative 0 %   Myelocytes 0 %   Promyelocytes Absolute 0 %   Blasts 0 %   nRBC 0 0 /100 WBC   Other 0 %   Neutro Abs 17.2 (H) 1.4 - 6.5 K/uL   Lymphs Abs 5.5 (H) 1.0 - 3.6 K/uL   Monocytes Absolute 1.2 (H) 0.2 - 1.0 K/uL   Eosinophils Absolute 0.0 0 - 0.7 K/uL   Basophils Absolute 0.0 0 - 0.1 K/uL   Smear Review MORPHOLOGY UNREMARKABLE   Comprehensive metabolic panel     Status: Abnormal   Collection Time: 06/29/16  3:25 PM  Result Value Ref Range   Sodium 132 (L) 135 - 145 mmol/L   Potassium 3.5 3.5 - 5.1 mmol/L   Chloride 93 (L) 101 - 111 mmol/L   CO2 27 22 - 32 mmol/L   Glucose, Bld 205 (H) 65 - 99 mg/dL   BUN 12 6 - 20 mg/dL   Creatinine, Ser 0.95 0.61 - 1.24 mg/dL   Calcium 8.6 (L) 8.9 - 10.3 mg/dL   Total Protein 7.6 6.5 - 8.1 g/dL   Albumin 3.7 3.5 - 5.0 g/dL   AST 31 15 - 41 U/L  ALT 30 17 - 63 U/L   Alkaline Phosphatase 50 38 - 126 U/L   Total Bilirubin 2.0 (H) 0.3 - 1.2 mg/dL   GFR calc non Af Amer >60 >60 mL/min   GFR calc Af Amer >60 >60 mL/min    Comment: (NOTE) The eGFR has been calculated using the CKD EPI equation. This calculation has not been validated in all clinical situations. eGFR's persistently <60 mL/min signify possible Chronic Kidney Disease.    Anion gap 12 5 - 15  Lipase, blood     Status: None   Collection Time: 06/29/16  3:25 PM  Result Value Ref Range   Lipase 16 11 - 51 U/L  Lactic acid, plasma     Status: Abnormal   Collection Time: 06/29/16  4:21 PM  Result Value Ref Range   Lactic Acid, Venous 2.8 (HH) 0.5 - 1.9 mmol/L    Comment: CRITICAL RESULT CALLED TO, READ BACK BY AND VERIFIED WITH KATIE NEWSHOLME 06/29/16 1712 KLW   Comprehensive metabolic panel     Status: Abnormal   Collection Time: 06/30/16  4:18 AM  Result Value Ref Range   Sodium 135 135 - 145 mmol/L   Potassium 3.6 3.5 - 5.1 mmol/L   Chloride 99 (L) 101 -  111 mmol/L   CO2 29 22 - 32 mmol/L   Glucose, Bld 226 (H) 65 - 99 mg/dL   BUN 16 6 - 20 mg/dL   Creatinine, Ser 1.17 0.61 - 1.24 mg/dL   Calcium 7.7 (L) 8.9 - 10.3 mg/dL   Total Protein 6.7 6.5 - 8.1 g/dL   Albumin 3.1 (L) 3.5 - 5.0 g/dL   AST 37 15 - 41 U/L   ALT 29 17 - 63 U/L   Alkaline Phosphatase 46 38 - 126 U/L   Total Bilirubin 1.7 (H) 0.3 - 1.2 mg/dL   GFR calc non Af Amer >60 >60 mL/min   GFR calc Af Amer >60 >60 mL/min    Comment: (NOTE) The eGFR has been calculated using the CKD EPI equation. This calculation has not been validated in all clinical situations. eGFR's persistently <60 mL/min signify possible Chronic Kidney Disease.    Anion gap 7 5 - 15  CBC     Status: Abnormal   Collection Time: 06/30/16  4:18 AM  Result Value Ref Range   WBC 19.8 (H) 3.8 - 10.6 K/uL   RBC 4.14 (L) 4.40 - 5.90 MIL/uL   Hemoglobin 14.1 13.0 - 18.0 g/dL   HCT 40.3 40.0 - 52.0 %   MCV 97.5 80.0 - 100.0 fL   MCH 34.0 26.0 - 34.0 pg   MCHC 34.9 32.0 - 36.0 g/dL   RDW 14.0 11.5 - 14.5 %   Platelets 187 150 - 440 K/uL  Glucose, capillary     Status: Abnormal   Collection Time: 06/30/16  5:55 PM  Result Value Ref Range   Glucose-Capillary 225 (H) 65 - 99 mg/dL    Dg Chest 2 View  Result Date: 06/29/2016 CLINICAL DATA:  Abdomen pain EXAM: CHEST  2 VIEW COMPARISON:  06/15/2013 FINDINGS: Minimal linear atelectasis left base. Stable mild elevation of the right diaphragm. No acute infiltrate or effusion. Stable heart size. No pneumothorax. IMPRESSION: Minimal left base atelectasis. No radiographic evidence for acute cardiopulmonary abnormality Electronically Signed   By: Donavan Foil M.D.   On: 06/29/2016 16:45   US Abdomen Limited Ruq  Result Date: 06/29/2016 CLINICAL DATA:  Right upper quadrant pain for 5 days EXAM: US  ABDOMEN LIMITED - RIGHT UPPER QUADRANT COMPARISON:  06/28/2016 FINDINGS: Gallbladder: Sludge and stones identified within the gallbladder. The gallbladder wall is  thickened measuring 4 mm. Trace pericholecystic fluid. Common bile duct: Diameter: 6 mm. Liver: No focal lesion identified.  Increased in parenchymal echogenicity. IMPRESSION: 1. Gallstones, gallbladder sludge and mild gallbladder wall thickening with pericholecystic fluid. Cannot rule out cholecystitis. If further imaging is clinically indicated a nuclear medicine hepatobiliary scan may be helpful to assess patency of the cystic duct. 2. Hepatic steatosis. Electronically Signed   By: Kerby Moors M.D.   On: 06/29/2016 18:28    ROS:  CONSTITUTIONAL: Denies fevers, chills. Denies any fatigue, weakness.  EYES: Denies blurry vision, double vision, eye pain. EARS, NOSE, THROAT: Denies tinnitus, ear pain, hearing loss. RESPIRATORY: Denies cough, wheeze, shortness of breath.  CARDIOVASCULAR: Denies chest pain, palpitations, edema.  GASTROINTESTINAL: Denies nausea, vomiting, diarrhea, has little discomfort on the right side of the abdominal pain. Denies bright red blood per rectum. GENITOURINARY: Denies dysuria, hematuria. ENDOCRINE: Denies nocturia or thyroid problems. HEMATOLOGIC AND LYMPHATIC: Denies easy bruising or bleeding. SKIN: Denies rash or lesion. MUSCULOSKELETAL: Denies pain in neck, back, shoulder, knees, hips or arthritic symptoms.  NEUROLOGIC: Has multiple sclerosis and neurogenic bladder with suprapubic catheter  PSYCHIATRIC: Denies anxiety or depressive symptoms. Blood pressure 104/70, pulse (!) 111, temperature 98.8 F (37.1 C), temperature source Oral, resp. rate 18, height 5' 9"  (1.753 m), weight 86.2 kg (190 lb), SpO2 96 %.   PHYSICAL EXAMINATION:  GENERAL: Well-nourished,  currently in no acute distress.  HEAD: Normocephalic, atraumatic.  EYES: Pupils equal, round, and reactive to light. Extraocular muscles intact. No scleral icterus.  MOUTH: Moist mucosal membranes. Dentition intact. No abscess noted. EARS, NOSE, THROAT: Clear without exudates. No external lesions.  NECK:  Supple. No thyromegaly. No nodules. No JVD.  PULMONARY: Clear to auscultation bilaterally without wheezes, rales, or rhonchi. No use of accessory muscles. Good respiratory effort. CHEST: Nontender to palpation.  CARDIOVASCULAR: S1, S2, regular rate and rhythm. No murmurs, rubs, or gallops.  GASTROINTESTINAL: Soft, right upper quadrant tenderness but no rebound tenderness nondistended. No masses. Positive bowel sounds. No hepatosplenomegaly. Suprapubic catheter is intact MUSCULOSKELETAL: No swelling, clubbing, edema. Range of motion full in all extremities. NEUROLOGIC: Patient is lethargic but arousable and oriented 3. Chronic multiple sclerosis, wheelchair-bound with lower extremity weakness SKIN: No ulcerations, lesions, rash, cyanosis. Skin warm, dry. Turgor intact. PSYCHIATRIC: Mood, affect within normal limits. Patient awake, alert, oriented x 3. Insight and judgment intact.   Assessment/Plan:  #Abnormal urinalysis with the supra pubic bladder catheter Urine culture is pending IV fluids IV Zosyn  #Diabetes mellitus type 2 Currently patient is nothing by mouth for surgery Accu-Cheks every 4 hours and sliding scale insulin will be provided Follow-up with diabetic coordinator  #History of coronary artery disease status post stent placement The patient is asymptomatic sees Dr.kOWALSKI  as an outpatient According to the wife patient just seen and evaluated by cardiology 2 months ago known uric medicines at this time  #Chronic history of multiple sclerosis wheelchair-bound, neurogenic bladder Hold his medications for multiple sclerosis at this time   #Acute cholecystitis Patient is nothing by mouth, IV fluids Pain management per surgery   DVT prophylaxis with SCDs  Thank you Dr. Pat Patrick for allowing hospitalist team to take care of the patient will follow-up with you during the hospital course  TOTAL TIME TAKING CARE OF THIS PATIENT: 50  minutes.   Note: This dictation was  prepared with Dragon dictation  along with smaller phrase technology. Any transcriptional errors that result from this process are unintentional.   @[facility] @ Pager - [phone] 06/30/2016, 7:09 PM

## 2016-06-30 NOTE — Progress Notes (Signed)
While rounding, CH made initial visit to room 113. Pt presented in a good mood though somewhat confused due to medications. Wife and son were bedside. Family was concerned with his confusion, but were in hopes it would diminish as the medication wore off. CH provided the ministry of conversation, listening, and prayer. CH is available for follow up as needed.    06/30/16 1500  Clinical Encounter Type  Visited With Patient;Patient and family together  Visit Type Initial;Spiritual support  Referral From Nurse  Spiritual Encounters  Spiritual Needs Prayer;Emotional

## 2016-06-30 NOTE — Progress Notes (Signed)
 dark amber/brown urine output from suprapubic catheter since 0700am. Bladder scan shows 81ml. Will continue to monitor closely.

## 2016-06-30 NOTE — Care Management (Signed)
Admitted to [facility] with the diagnosis of cholecystitis. Lives with wife, Britta Mccreedy, [phone]). Last seen Dr. Dareen Piano a month ago. Followed by [facility] monthly for indwelling cath for a couple of years.  Followed by [facility] in the past.  [facility] 3 years ago. [facility] and [facility] in the past. No home oxygen. Lift, bedside commode, and hospital bed in the home. Last fall was about a year go. Bed bound/wheelchair bound. Decreased appetite for a while. Prescriptions are filled per [facility] and [facility] in [place]. Wife will transport per wheelchair and Zenaida Niece at discharge, Gwenette Greet RN MSN CCM Care management

## 2016-06-30 NOTE — Progress Notes (Signed)
65 year old male with multiple medical issues including multiple sclerosis for which she is wheelchair bound and has a suprapubic catheter here for urinary tract infection and possible acute cholecystitis. The patient has not had pain today and states the pain had been intermittent in his epigastrium min right upper quadrant. The patient states that he is feeling much better since being here. The patient has had a lot of weakness which is normal in MS patients with acute illness and he has also had some confusion.  He has also had several bouts of nausea and has been treated for gastritis with Protonix now twice daily which has seemed to help slightly. He currently has no fever and no pain in the abdomen.  Vitals:   06/30/16 1305 06/30/16 1432  BP:    Pulse: (!) 111   Resp:    Temp:  98.8 F (37.1 C)   PE:  Gen: NAD Res: CTAB/L Cardio: RRR Abd: soft, obese, non distended, non tender, suprapubic catheter in place Ext: 2+ pulses, no edema  CBC Latest Ref Rng & Units 06/30/2016 06/29/2016 06/18/2013  WBC 3.8 - 10.6 K/uL 19.8(H) 23.9(H) 7.6  Hemoglobin 13.0 - 18.0 g/dL 44.3 15.4 00.8  Hematocrit 40.0 - 52.0 % 40.3 45.1 37.8(L)  Platelets 150 - 440 K/uL 187 194 162   CMP Latest Ref Rng & Units 06/30/2016 06/29/2016 06/18/2013  Glucose 65 - 99 mg/dL 676(P) 950(D) 326(Z)  BUN 6 - 20 mg/dL 16 12 9   Creatinine 0.61 - 1.24 mg/dL 1.24 5.80 9.98  Sodium 135 - 145 mmol/L 135 132(L) 136  Potassium 3.5 - 5.1 mmol/L 3.6 3.5 3.8  Chloride 101 - 111 mmol/L 99(L) 93(L) 104  CO2 22 - 32 mmol/L 29 27 27   Calcium 8.9 - 10.3 mg/dL 7.7(L) 8.6(L) 8.7  Total Protein 6.5 - 8.1 g/dL 6.7 7.6 -  Total Bilirubin 0.3 - 1.2 mg/dL 3.3(A) 2.0(H) -  Alkaline Phos 38 - 126 U/L 46 50 -  AST 15 - 41 U/L 37 31 -  ALT 17 - 63 U/L 29 30 -   A/P:  65 year old male with multiple medical issues and MS now with concern for a UTI and acute cholecystitis. 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 I am concerned with the blood cell count initially being 23 and only coming down to 19 that he may do better having a cholecystostomy tube placed during this admission and looking forward to taking the gallbladder out in 6-8 weeks when the inflammation has resolved. The patient and wife were in agreement with this plan and I will see if his leukocytosis has come down tomorrow and if not will place a cholecystostomy tube at that time.

## 2016-06-30 NOTE — Care Management Important Message (Signed)
Important Message  Patient Details  Name: Jesus Sanchez MRN: [id number] Date of Birth: 09/07/1950   Medicare Important Message Given:  Yes    Gwenette Greet, RN 06/30/2016, 8:34 AM

## 2016-06-30 NOTE — Progress Notes (Signed)
Dr. Orvis BrillLoflin covering for Dr. Michela PitcherEly today. Patient to remain NPO until she comes to see patient for possible surgery. Patient and wife updated on plan of care.

## 2016-07-01 ENCOUNTER — Inpatient Hospital Stay: Payer: Medicare Other

## 2016-07-01 ENCOUNTER — Encounter: Payer: Self-pay | Admitting: Radiology

## 2016-07-01 LAB — COMPREHENSIVE METABOLIC PANEL
ALBUMIN: 2.8 g/dL — AB (ref 3.5–5.0)
ALT: 24 U/L (ref 17–63)
ANION GAP: 8 (ref 5–15)
AST: 34 U/L (ref 15–41)
Alkaline Phosphatase: 48 U/L (ref 38–126)
BILIRUBIN TOTAL: 1.3 mg/dL — AB (ref 0.3–1.2)
BUN: 22 mg/dL — AB (ref 6–20)
CO2: 24 mmol/L (ref 22–32)
Calcium: 7.7 mg/dL — ABNORMAL LOW (ref 8.9–10.3)
Chloride: 107 mmol/L (ref 101–111)
Creatinine, Ser: 0.92 mg/dL (ref 0.61–1.24)
GFR calc Af Amer: >60 mL/min (ref >60)
GFR calc non Af Amer: >60 mL/min (ref >60)
GLUCOSE: 195 mg/dL — AB (ref 65–99)
POTASSIUM: 4.4 mmol/L (ref 3.5–5.1)
SODIUM: 139 mmol/L (ref 135–145)
Total Protein: 6.4 g/dL — ABNORMAL LOW (ref 6.5–8.1)

## 2016-07-01 LAB — CBC
HEMATOCRIT: 36.9 % — AB (ref 40.0–52.0)
Hemoglobin: 12.7 g/dL — ABNORMAL LOW (ref 13.0–18.0)
MCH: 33.9 pg (ref 26.0–34.0)
MCHC: 34.4 g/dL (ref 32.0–36.0)
MCV: 98.6 fL (ref 80.0–100.0)
Platelets: 192 10*3/uL (ref 150–440)
RBC: 3.74 MIL/uL — ABNORMAL LOW (ref 4.40–5.90)
RDW: 13.9 % (ref 11.5–14.5)
WBC: 15.4 10*3/uL — AB (ref 3.8–10.6)

## 2016-07-01 LAB — GLUCOSE, CAPILLARY
GLUCOSE-CAPILLARY: 159 mg/dL — AB (ref 65–99)
GLUCOSE-CAPILLARY: 142 mg/dL — AB (ref 65–99)
GLUCOSE-CAPILLARY: 145 mg/dL — AB (ref 65–99)
Glucose-Capillary: 180 mg/dL — ABNORMAL HIGH (ref 65–99)
Glucose-Capillary: 213 mg/dL — ABNORMAL HIGH (ref 65–99)
Glucose-Capillary: 164 mg/dL — ABNORMAL HIGH (ref 65–99)
Glucose-Capillary: 123 mg/dL — ABNORMAL HIGH (ref 65–99)

## 2016-07-01 MED ORDER — ACETAMINOPHEN 325 MG PO TABS: 650.0000 mg | ORAL_TABLET | Freq: Four times a day (QID) | ORAL | Status: DC | PRN

## 2016-07-01 MED ORDER — IOPAMIDOL (ISOVUE-300) INJECTION 61%
100.0000 mL | Freq: Once | INTRAVENOUS | Status: AC | PRN
  Administered 2016-07-01: 100 mL via INTRAVENOUS

## 2016-07-01 MED ORDER — SODIUM CHLORIDE 0.9 % IV SOLN
INTRAVENOUS | Status: DC
  Administered 2016-07-02: 08:00:00 via INTRAVENOUS

## 2016-07-01 MED ORDER — DIATRIZOATE MEGLUMINE & SODIUM 66-10 % PO SOLN
15.0000 mL | ORAL | Status: AC
  Administered 2016-07-01 (×2): 15 mL via ORAL

## 2016-07-01 MED ORDER — ZOLPIDEM TARTRATE 5 MG PO TABS
5.0000 mg | ORAL_TABLET | Freq: Every evening | ORAL | Status: DC | PRN
  Administered 2016-07-01 – 2016-07-03 (×3): 5 mg via ORAL
  Filled 2016-07-01 (×3): qty 1

## 2016-07-01 NOTE — Progress Notes (Signed)
[facility] [place] [facility]   PATIENT NAME: Jesus StarringWalter Sanchez    MR#:  [id number]  DATE OF BIRTH:  [birth year]  SUBJECTIVE:  CHIEF COMPLAINT:    REVIEW OF SYSTEMS:  CONSTITUTIONAL: No fever, fatigue or weakness.  EYES: No blurred or double vision.  EARS, NOSE, AND THROAT: No tinnitus or ear pain.  RESPIRATORY: No cough, shortness of breath, wheezing or hemoptysis.  CARDIOVASCULAR: No chest pain, orthopnea, edema.  GASTROINTESTINAL: No nausea, vomiting, diarrhea or abdominal pain.  GENITOURINARY: No dysuria, hematuria.  ENDOCRINE: No polyuria, nocturia,  HEMATOLOGY: No anemia, easy bruising or bleeding SKIN: No rash or lesion. MUSCULOSKELETAL: No joint pain or arthritis.   NEUROLOGIC: No tingling, numbness, weakness.  PSYCHIATRY: No anxiety or depression.   DRUG ALLERGIES:   Allergies  Allergen Reactions  . Oxycodone-Acetaminophen Itching    VITALS:  Blood pressure 101/64, pulse (!) 102, temperature 98.7 F (37.1 C), temperature source Oral, resp. rate 16, height 5\' 9"  (1.753 m), weight 90.7 kg (200 lb), SpO2 94 %.  PHYSICAL EXAMINATION:  GENERAL:  65 y.o.-year-old patient lying in the bed with no acute distress.  EYES: Pupils equal, round, reactive to light and accommodation. No scleral icterus. Extraocular muscles intact.  HEENT: Head atraumatic, normocephalic. Oropharynx and nasopharynx clear.  NECK:  Supple, no jugular venous distention. No thyroid enlargement, no tenderness.  LUNGS: Normal breath sounds bilaterally, no wheezing, rales,rhonchi or crepitation. No use of accessory muscles of respiration.  CARDIOVASCULAR: S1, S2 normal. No murmurs, rubs, or gallops.  ABDOMEN: Soft, nontender, nondistended. Bowel sounds present. No organomegaly or mass.  EXTREMITIES: No pedal edema, cyanosis, or clubbing.  NEUROLOGIC: Cranial nerves II through XII are intact. Muscle strength 5/5 in all extremities. Sensation intact. Gait not checked.   PSYCHIATRIC: The patient is alert and oriented x 3.  SKIN: No obvious rash, lesion, or ulcer.    LABORATORY PANEL:   CBC  Recent Labs Lab 07/01/16 0550  WBC 15.4*  HGB 12.7*  HCT 36.9*  PLT 192   ------------------------------------------------------------------------------------------------------------------  Chemistries   Recent Labs Lab 07/01/16 0550  NA 139  K 4.4  CL 107  CO2 24  GLUCOSE 195*  BUN 22*  CREATININE 0.92  CALCIUM 7.7*  AST 34  ALT 24  ALKPHOS 48  BILITOT 1.3*   ------------------------------------------------------------------------------------------------------------------  Cardiac Enzymes No results for input(s): TROPONINI in the last 168 hours. ------------------------------------------------------------------------------------------------------------------  RADIOLOGY:  Dg Chest 2 View  Result Date: 06/29/2016 CLINICAL DATA:  Abdomen pain EXAM: CHEST  2 VIEW COMPARISON:  06/15/2013 FINDINGS: Minimal linear atelectasis left base. Stable mild elevation of the right diaphragm. No acute infiltrate or effusion. Stable heart size. No pneumothorax. IMPRESSION: Minimal left base atelectasis. No radiographic evidence for acute cardiopulmonary abnormality Electronically Signed   By: Jasmine PangKim  Fujinaga M.D.   On: 06/29/2016 16:45   Ct Abdomen Pelvis W Contrast  Result Date: 07/01/2016 CLINICAL DATA:  One week history of right-sided pain EXAM: CT ABDOMEN AND PELVIS WITH CONTRAST TECHNIQUE: Multidetector CT imaging of the abdomen and pelvis was performed using the standard protocol following bolus administration of intravenous contrast. Oral contrast was also administered. CONTRAST:  100mL ISOVUE-300 IOPAMIDOL (ISOVUE-300) INJECTION 61% COMPARISON:  Ultrasound right upper quadrant June 29, 2016 FINDINGS: Lower chest: There is bibasilar atelectasis with rather minimal pleural effusions bilaterally. There are foci of coronary artery calcification evident.  Hepatobiliary: No focal liver lesions are evident. Gallbladder is distended with wall thickening and stranding in the pericholecystic region. There is slight pericholecystic  fluid as well. There is no appreciable biliary duct dilatation. Pancreas: There is no pancreatic mass or inflammatory focus. Spleen: No splenic lesions are evident. Adrenals/Urinary Tract: Adrenals appear normal bilaterally. Kidneys show evidence of fetal lobulation bilaterally, an anatomic variant. There is no renal mass or hydronephrosis on either side. There is no renal or ureteral calculus on either side. Urinary bladder is decompressed with a catheter. The urinary bladder wall thickness is difficult to assess in this circumstance. Stomach/Bowel: There is no appreciable bowel wall or mesenteric thickening. There are scattered colonic diverticula without diverticulitis. There is no bowel obstruction. No free air or portal venous air. A loop of colon extends to the margin of a left inguinal hernia but does not see extend appreciably into this hernia. There is no bowel compromise in this area. Vascular/Lymphatic: There is atherosclerotic calcification in the aorta and common iliac arteries. No aneurysm evident. Major mesenteric vessels appear patent. There is no appreciable adenopathy in the abdomen or pelvis. Reproductive: Prostate contains multiple small calculi. Prostate and seminal vesicles are normal in size and contour. There is no pelvic mass or pelvic fluid collection. Other: Appendix appears normal. No ascites or abscess is evident in the abdomen or pelvis. There is a left inguinal hernia which contains fat. Bowel extends to the immediate proximal aspect of the hernia without appreciable entry of bowel into the hernia. There is a smaller inguinal hernia on the right containing only fat. There is a small ventral hernia containing only fat. Musculoskeletal: A stimulator device is present in the lateral left mid to lower abdomen. No leads  from this device are evident. There is evidence of an old healed fracture of the posterior left ischium. There is arthropathy in both hip joints. There is arthropathy in the lumbar spine. There are no blastic or lytic bone lesions. There is a small apparent bone island in the left inferior iliac crest. There is no intramuscular or abdominal wall lesions. IMPRESSION: Findings indicative of a degree of acute cholecystitis. No renal or ureteral calculus. No hydronephrosis. Urinary bladder is decompressed with a catheter. Urinary bladder wall cannot be accurately assessed in this circumstance. No bowel obstruction.  No abscess.  Appendix appears normal. There is aortoiliac atherosclerosis. There is multifocal coronary artery calcification. Small pleural effusions bilaterally with bibasilar atelectasis. There is an inguinal hernia on the left which contains mainly fat but minimal bowel at its origin. No bowel compromise. There is a smaller right inguinal hernia. There is also a small ventral hernia containing only fat. Electronically Signed   By: Bretta Bang III M.D.   On: 07/01/2016 13:38   US Abdomen Limited Ruq  Result Date: 06/29/2016 CLINICAL DATA:  Right upper quadrant pain for 5 days EXAM: US ABDOMEN LIMITED - RIGHT UPPER QUADRANT COMPARISON:  06/28/2016 FINDINGS: Gallbladder: Sludge and stones identified within the gallbladder. The gallbladder wall is thickened measuring 4 mm. Trace pericholecystic fluid. Common bile duct: Diameter: 6 mm. Liver: No focal lesion identified.  Increased in parenchymal echogenicity. IMPRESSION: 1. Gallstones, gallbladder sludge and mild gallbladder wall thickening with pericholecystic fluid. Cannot rule out cholecystitis. If further imaging is clinically indicated a nuclear medicine hepatobiliary scan may be helpful to assess patency of the cystic duct. 2. Hepatic steatosis. Electronically Signed   By: Signa Kell M.D.   On: 06/29/2016 18:28    EKG:   Orders placed  or performed during the hospital encounter of 06/29/16  . EKG 12-Lead  . EKG 12-Lead    ASSESSMENT AND  PLAN:     #Abnormal urinalysis with the supra pubic bladder catheter Urine culture > thousand colonies of Enterobacter. Sensitive depending  IV fluids IV Zosyn  #Diabetes mellitus type 2 Currently patient is nothing by mouth for surgery Accu-Cheks every 4 hours and sliding scale insulin will be provided Follow-up with diabetic coordinator  #History of coronary artery disease status post stent placement The patient is asymptomatic sees Dr.[email or web address]  as an outpatient According to the wife patient just seen and evaluated by cardiology 2 months ago known uric medicines at this time  #Chronic history of multiple sclerosis wheelchair-bound, neurogenic bladder Hold his medications for multiple sclerosis at this time   #Acute cholecystitis with  leukocytosis cholecystostomy tube by IR Patient is nothing by mouth, IV fluids Pain management per surgery  DVT prophylaxis with SCDs All the records are reviewed and case discussed with Care Management/Social Workerr. Management plans discussed with the patient, family and they are in agreement.   TOTAL TIME TAKING CARE OF THIS PATIENT: 35 minutes.    Note: This dictation was prepared with Dragon dictation along with smaller phrase technology. Any transcriptional errors that result from this process are unintentional.   Ramonita Lab M.D on 07/01/2016 at 3:46 PM  Between 7am to 6pm - Pager - [phone] After 6pm go to [email or web address] - password [email or web address] [facility]  [place] [facility]  Office  [phone]  CC: Primary care physician; Lauro Regulus., MD

## 2016-07-01 NOTE — Consult Note (Signed)
Chief Complaint: Patient was seen in consultation today for  Chief Complaint  Patient presents with  . Abdominal Pain   at the request of Dr. Marguerita Merles  Referring Physician(s): Marguerita Merles  Patient Status: [facility] - In-pt  History of Present Illness: Jesus Sanchez is a 65 y.o. male with multiple medical problems including multiple sclerosis and chronic suprapubic catheter. The patient is being treated for urinary tract infection and also complaining of right abdominal pain. Patient had an ultrasound and CT which are suggestive for acute cholecystitis. Patient was evaluated by Dr. Orvis Brill of General Surgery who felt the patient was not a good candidate for a cholecystectomy at this time and recommended a percutaneous cholecystostomy tube placement. Patient is currently resting comfortably in his bed but he does have right upper quadrant tenderness. Reportedly, the patient has been confused recently but currently the patient is alert and oriented.  Past Medical History:  Diagnosis Date  . Depression   . Diabetes mellitus without complication (HCC)   . GERD (gastroesophageal reflux disease)   . Hypertension   . MS (multiple sclerosis) (HCC) 10/02/2015  . Neuromuscular disorder [facility])     Past Surgical History:  Procedure Laterality Date  . HERNIA REPAIR  2014   [facility]- Dr. Egbert Garibaldi    Allergies: Oxycodone-acetaminophen  Medications: Prior to Admission medications   Medication Sig Start Date End Date Taking? Authorizing Provider  aspirin EC 81 MG tablet Take 81 mg by mouth.   Yes Historical Provider, MD  atorvastatin (LIPITOR) 10 MG tablet Take 10 mg by mouth daily.   Yes Historical Provider, MD  baclofen (GABLOFEN) 40000 MCG/20ML SOLN by Intrathecal route. 07/07/15  Yes Historical Provider, MD  citalopram (CELEXA) 40 MG tablet Take by mouth. 03/03/16  Yes Historical Provider, MD  gabapentin (NEURONTIN) 300 MG capsule Take 900 mg by mouth. 900 mg at hs 600mg   in  am 11/29/13  Yes Historical Provider, MD  losartan-hydrochlorothiazide (HYZAAR) 100-25 MG tablet Take 1 tablet by mouth daily.  05/26/15 06/29/16 Yes Historical Provider, MD  metFORMIN (GLUCOPHAGE) 500 MG tablet Take 1,000 mg by mouth 2 (two) times daily with a meal.   Yes Historical Provider, MD  natalizumab (TYSABRI) 300 MG/15ML injection Inject into the vein. 09/19/15 09/18/16 Yes Historical Provider, MD  oxybutynin (DITROPAN) 5 MG tablet Take 5 mg by mouth every 8 (eight) hours as needed.  08/19/15  Yes Historical Provider, MD  pantoprazole (PROTONIX) 40 MG tablet Take 40 mg by mouth 2 (two) times daily.  08/27/15  Yes Historical Provider, MD     Family History  Problem Relation Age of Onset  . Ulcerative colitis Mother   . Diabetes Mother   . Diabetes Sister   . Colon polyps Sister     Social History   Social History  . Marital status: Married    Spouse name: N/A  . Number of children: N/A  . Years of education: N/A   Social History Main Topics  . Smoking status: Former Smoker    Packs/day: 2.00    Years: 25.00  . Smokeless tobacco: Former Neurosurgeon    Quit date: 05/20/1979  . Alcohol use No  . Drug use: No  . Sexual activity: Not Asked   Other Topics Concern  . None   Social History Narrative  . None      Review of Systems  Gastrointestinal: Positive for abdominal pain.  Genitourinary:       Dark urine from suprapubic tube.  Vital Signs: BP 101/64 (BP Location: Right Arm)   Pulse (!) 102   Temp 98.7 F (37.1 C) (Oral)   Resp 16   Ht 5\' 9"  (1.753 m)   Wt 200 lb (90.7 kg)   SpO2 94%   BMI 29.53 kg/m   Physical Exam  Cardiovascular: Normal rate, regular rhythm and normal heart sounds.   Pulmonary/Chest: Effort normal and breath sounds normal.  Abdominal: Bowel sounds are normal. There is tenderness.  RUQ tenderness with palpation.   Genitourinary:  Genitourinary Comments: Suprapubic catheter intact.  Dark urine in bag.         Imaging: Dg Chest 2  View  Result Date: 06/29/2016 CLINICAL DATA:  Abdomen pain EXAM: CHEST  2 VIEW COMPARISON:  06/15/2013 FINDINGS: Minimal linear atelectasis left base. Stable mild elevation of the right diaphragm. No acute infiltrate or effusion. Stable heart size. No pneumothorax. IMPRESSION: Minimal left base atelectasis. No radiographic evidence for acute cardiopulmonary abnormality Electronically Signed   By: Jasmine PangKim  Fujinaga M.D.   On: 06/29/2016 16:45   [place] Abdomen Complete  Result Date: 06/28/2016 CLINICAL DATA:  Right upper quadrant abdominal pain EXAM: ABDOMEN ULTRASOUND COMPLETE COMPARISON:  None. FINDINGS: Gallbladder: Well distended with evidence of gallbladder sludge as well as gallbladder calculi. No wall thickening or pericholecystic fluid is noted. Negative sonographic Eulah PontMurphy sign is elicited. Common bile duct: Diameter: 4.2 mm. Liver: Diffusely increased in echogenicity likely related to fatty infiltration. IVC: Not well seen Pancreas: Not well seen Spleen: Size and appearance within normal limits. Right Kidney: Somewhat obscured by overlying bowel gas. No definitive obstructive change is noted. Left Kidney: Length: 10.6 cm. Somewhat obscured by overlying bowel gas. No obstructive changes are seen. Abdominal aorta: No aneurysm visualized. Other findings: None. IMPRESSION: Fatty liver. Cholelithiasis and gallbladder sludge. Electronically Signed   By: Alcide CleverMark  Lukens M.D.   On: 06/28/2016 17:32   Ct Abdomen Pelvis W Contrast  Result Date: 07/01/2016 CLINICAL DATA:  One week history of right-sided pain EXAM: CT ABDOMEN AND PELVIS WITH CONTRAST TECHNIQUE: Multidetector CT imaging of the abdomen and pelvis was performed using the standard protocol following bolus administration of intravenous contrast. Oral contrast was also administered. CONTRAST:  100mL ISOVUE-300 IOPAMIDOL (ISOVUE-300) INJECTION 61% COMPARISON:  Ultrasound right upper quadrant June 29, 2016 FINDINGS: Lower chest: There is bibasilar  atelectasis with rather minimal pleural effusions bilaterally. There are foci of coronary artery calcification evident. Hepatobiliary: No focal liver lesions are evident. Gallbladder is distended with wall thickening and stranding in the pericholecystic region. There is slight pericholecystic fluid as well. There is no appreciable biliary duct dilatation. Pancreas: There is no pancreatic mass or inflammatory focus. Spleen: No splenic lesions are evident. Adrenals/Urinary Tract: Adrenals appear normal bilaterally. Kidneys show evidence of fetal lobulation bilaterally, an anatomic variant. There is no renal mass or hydronephrosis on either side. There is no renal or ureteral calculus on either side. Urinary bladder is decompressed with a catheter. The urinary bladder wall thickness is difficult to assess in this circumstance. Stomach/Bowel: There is no appreciable bowel wall or mesenteric thickening. There are scattered colonic diverticula without diverticulitis. There is no bowel obstruction. No free air or portal venous air. A loop of colon extends to the margin of a left inguinal hernia but does not see extend appreciably into this hernia. There is no bowel compromise in this area. Vascular/Lymphatic: There is atherosclerotic calcification in the aorta and common iliac arteries. No aneurysm evident. Major mesenteric vessels appear patent. There is no appreciable adenopathy  in the abdomen or pelvis. Reproductive: Prostate contains multiple small calculi. Prostate and seminal vesicles are normal in size and contour. There is no pelvic mass or pelvic fluid collection. Other: Appendix appears normal. No ascites or abscess is evident in the abdomen or pelvis. There is a left inguinal hernia which contains fat. Bowel extends to the immediate proximal aspect of the hernia without appreciable entry of bowel into the hernia. There is a smaller inguinal hernia on the right containing only fat. There is a small ventral hernia  containing only fat. Musculoskeletal: A stimulator device is present in the lateral left mid to lower abdomen. No leads from this device are evident. There is evidence of an old healed fracture of the posterior left ischium. There is arthropathy in both hip joints. There is arthropathy in the lumbar spine. There are no blastic or lytic bone lesions. There is a small apparent bone island in the left inferior iliac crest. There is no intramuscular or abdominal wall lesions. IMPRESSION: Findings indicative of a degree of acute cholecystitis. No renal or ureteral calculus. No hydronephrosis. Urinary bladder is decompressed with a catheter. Urinary bladder wall cannot be accurately assessed in this circumstance. No bowel obstruction.  No abscess.  Appendix appears normal. There is aortoiliac atherosclerosis. There is multifocal coronary artery calcification. Small pleural effusions bilaterally with bibasilar atelectasis. There is an inguinal hernia on the left which contains mainly fat but minimal bowel at its origin. No bowel compromise. There is a smaller right inguinal hernia. There is also a small ventral hernia containing only fat. Electronically Signed   By: Bretta Bang III M.D.   On: 07/01/2016 13:38   US Abdomen Limited Ruq  Result Date: 06/29/2016 CLINICAL DATA:  Right upper quadrant pain for 5 days EXAM: US ABDOMEN LIMITED - RIGHT UPPER QUADRANT COMPARISON:  06/28/2016 FINDINGS: Gallbladder: Sludge and stones identified within the gallbladder. The gallbladder wall is thickened measuring 4 mm. Trace pericholecystic fluid. Common bile duct: Diameter: 6 mm. Liver: No focal lesion identified.  Increased in parenchymal echogenicity. IMPRESSION: 1. Gallstones, gallbladder sludge and mild gallbladder wall thickening with pericholecystic fluid. Cannot rule out cholecystitis. If further imaging is clinically indicated a nuclear medicine hepatobiliary scan may be helpful to assess patency of the cystic duct.  2. Hepatic steatosis. Electronically Signed   By: Signa Kell M.D.   On: 06/29/2016 18:28    Labs:  CBC:  Recent Labs  06/29/16 1525 06/30/16 0418 07/01/16 0550  WBC 23.9* 19.8* 15.4*  HGB 15.7 14.1 12.7*  HCT 45.1 40.3 36.9*  PLT 194 187 192    COAGS: No results for input(s): INR, APTT in the last 8760 hours.  BMP:  Recent Labs  06/29/16 1525 06/30/16 0418 07/01/16 0550  NA 132* 135 139  K 3.5 3.6 4.4  CL 93* 99* 107  CO2 27 29 24   GLUCOSE 205* 226* 195*  BUN 12 16 22*  CALCIUM 8.6* 7.7* 7.7*  CREATININE 0.95 1.17 0.92  GFRNONAA >60 >60 >60  GFRAA >60 >60 >60    LIVER FUNCTION TESTS:  Recent Labs  06/29/16 1525 06/30/16 0418 07/01/16 0550  BILITOT 2.0* 1.7* 1.3*  AST 31 37 34  ALT 30 29 24   ALKPHOS 50 46 48  PROT 7.6 6.7 6.4*  ALBUMIN 3.7 3.1* 2.8*    TUMOR MARKERS: No results for input(s): AFPTM, CEA, CA199, CHROMGRNA in the last 8760 hours.  Assessment and Plan:  65 year old male with multiple medical problems including multiple sclerosis and urinary  tract infection. Patient has right upper quadrant pain and persistent elevated white blood cell. Imaging is suggestive for acute cholecystitis. I have reviewed the patient's medical chart and images. Patient is not a candidate for cholecystectomy at this time and Surgery has requested a cholecystostomy tube. Agree that a cholecystostomy tube is appropriate at this time. I discussed the procedure in depth with the patient and his wife. They have a very good understanding of the procedure. The understanding that he will likely have this tube for a minimum of 6-8 weeks. Plan for image guided percutaneous cholecystostomy tube on 07/02/2016.  Thank you for this interesting consult.  I greatly enjoyed meeting [facility] Sanchez [facility] and look forward to participating in their care.  A copy of this report was sent to the requesting provider on this date.  Electronically Signed: Abundio Miu 07/01/2016,  4:56 PM   I spent a total of 20 Minutes    in face to face in clinical consultation, greater than 50% of which was counseling/coordinating care for acute cholecystitis.

## 2016-07-01 NOTE — Progress Notes (Signed)
65 year old male with multiple medical issues including multiple sclerosis for which she is wheelchair bound and has a suprapubic catheter here for urinary tract infection and possible acute cholecystitis. The patient did have some pain in RUQ and back overnight.  He states much confusion during the night as well. He states feeling slightly worse.    Vitals:   06/30/16 1950 07/01/16 0420  BP: (!) 97/56 101/64  Pulse: (!) 102 (!) 102  Resp: 16 16  Temp: 98.5 F (36.9 C) 98.7 F (37.1 C)   PE:  Gen: NAD Res: CTAB/L Cardio: RRR Abd: soft, obese, tender in RUQ with a firmness just below the costal margin, suprapubic catheter in place Ext: 2+ pulses, no edema  CBC Latest Ref Rng & Units 07/01/2016 06/30/2016 06/29/2016  WBC 3.8 - 10.6 K/uL 15.4(H) 19.8(H) 23.9(H)  Hemoglobin 13.0 - 18.0 g/dL 12.7(L) 14.1 15.7  Hematocrit 40.0 - 52.0 % 36.9(L) 40.3 45.1  Platelets 150 - 440 K/uL 192 187 194   CMP Latest Ref Rng & Units 07/01/2016 06/30/2016 06/29/2016  Glucose 65 - 99 mg/dL 511(M) 211(Z) 735(A)  BUN 6 - 20 mg/dL 70(L) 16 12  Creatinine 0.61 - 1.24 mg/dL 4.10 3.01 3.14  Sodium 135 - 145 mmol/L 139 135 132(L)  Potassium 3.5 - 5.1 mmol/L 4.4 3.6 3.5  Chloride 101 - 111 mmol/L 107 99(L) 93(L)  CO2 22 - 32 mmol/L 24 29 27   Calcium 8.9 - 10.3 mg/dL 7.7(L) 7.7(L) 8.6(L)  Total Protein 6.5 - 8.1 g/dL 6.4(L) 6.7 7.6  Total Bilirubin 0.3 - 1.2 mg/dL 3.8(O) 8.7(N) 2.0(H)  Alkaline Phos 38 - 126 U/L 48 46 50  AST 15 - 41 U/L 34 37 31  ALT 17 - 63 U/L 24 29 30    A/P:  65 year old male with multiple medical issues and MS now with concern for a UTI and acute cholecystitis.  The WBC decreased slightly today but given still elevated with worsening pain and confusion, I feel the best course of action is to get a Cholecystostomy tube placed today. I have placed the order and awaiting call back from IR. I will also try toradol instead of dilaudid and see if the confusion improves. Will keep npo until  drain placed but likely clear liquids afterward.

## 2016-07-01 NOTE — Progress Notes (Signed)
Patient and wife updated plan for percutaneous cholecystostomy tube on 07/02/2016 around 9-9:30am per Juanita in Interventional Radiology. Patient remains NPO-ice chips/sips with meds. Consent signed at bedside with Dr. Lowella Dandy this evening and witnessed by [facility] RN.

## 2016-07-02 ENCOUNTER — Encounter: Payer: Self-pay | Admitting: Diagnostic Radiology

## 2016-07-02 ENCOUNTER — Inpatient Hospital Stay: Payer: Medicare Other

## 2016-07-02 HISTORY — PX: IR GENERIC HISTORICAL: IMG1180011

## 2016-07-02 LAB — CBC
HCT: 38.2 % — ABNORMAL LOW (ref 40.0–52.0)
HEMOGLOBIN: 13 g/dL (ref 13.0–18.0)
MCH: 34 pg (ref 26.0–34.0)
MCHC: 34 g/dL (ref 32.0–36.0)
MCV: 100.1 fL — ABNORMAL HIGH (ref 80.0–100.0)
PLATELETS: 222 10*3/uL (ref 150–440)
RBC: 3.81 MIL/uL — AB (ref 4.40–5.90)
RDW: 14.3 % (ref 11.5–14.5)
WBC: 17.4 10*3/uL — ABNORMAL HIGH (ref 3.8–10.6)

## 2016-07-02 LAB — COMPREHENSIVE METABOLIC PANEL
ALBUMIN: 3.1 g/dL — AB (ref 3.5–5.0)
ALBUMIN: 2.7 g/dL — AB (ref 3.5–5.0)
ALK PHOS: 60 U/L (ref 38–126)
ALK PHOS: 49 U/L (ref 38–126)
ALT: 31 U/L (ref 17–63)
ALT: 27 U/L (ref 17–63)
ANION GAP: 12 (ref 5–15)
ANION GAP: 6 (ref 5–15)
AST: 48 U/L — ABNORMAL HIGH (ref 15–41)
AST: 38 U/L (ref 15–41)
BUN: 15 mg/dL (ref 6–20)
BUN: 16 mg/dL (ref 6–20)
CALCIUM: 8.1 mg/dL — AB (ref 8.9–10.3)
CALCIUM: 7.6 mg/dL — AB (ref 8.9–10.3)
CHLORIDE: 105 mmol/L (ref 101–111)
CO2: 24 mmol/L (ref 22–32)
CO2: 27 mmol/L (ref 22–32)
Chloride: 104 mmol/L (ref 101–111)
Creatinine, Ser: 0.94 mg/dL (ref 0.61–1.24)
Creatinine, Ser: 0.81 mg/dL (ref 0.61–1.24)
GFR calc Af Amer: >60 mL/min (ref >60)
GFR calc non Af Amer: >60 mL/min (ref >60)
GFR calc non Af Amer: >60 mL/min (ref >60)
GLUCOSE: 161 mg/dL — AB (ref 65–99)
Glucose, Bld: 170 mg/dL — ABNORMAL HIGH (ref 65–99)
POTASSIUM: 3.7 mmol/L (ref 3.5–5.1)
POTASSIUM: 3.6 mmol/L (ref 3.5–5.1)
SODIUM: 141 mmol/L (ref 135–145)
SODIUM: 137 mmol/L (ref 135–145)
TOTAL PROTEIN: 6.2 g/dL — AB (ref 6.5–8.1)
Total Bilirubin: 1.6 mg/dL — ABNORMAL HIGH (ref 0.3–1.2)
Total Bilirubin: 1.1 mg/dL (ref 0.3–1.2)
Total Protein: 7 g/dL (ref 6.5–8.1)

## 2016-07-02 LAB — URINE CULTURE: Culture: >=100000 — AB

## 2016-07-02 LAB — PROTIME-INR
INR: 1.23
PROTHROMBIN TIME: 15.6 s — AB (ref 11.4–15.2)

## 2016-07-02 LAB — GLUCOSE, CAPILLARY
GLUCOSE-CAPILLARY: 151 mg/dL — AB (ref 65–99)
GLUCOSE-CAPILLARY: 155 mg/dL — AB (ref 65–99)
Glucose-Capillary: 175 mg/dL — ABNORMAL HIGH (ref 65–99)
Glucose-Capillary: 135 mg/dL — ABNORMAL HIGH (ref 65–99)
Glucose-Capillary: 179 mg/dL — ABNORMAL HIGH (ref 65–99)
Glucose-Capillary: 151 mg/dL — ABNORMAL HIGH (ref 65–99)
Glucose-Capillary: 171 mg/dL — ABNORMAL HIGH (ref 65–99)

## 2016-07-02 LAB — HEMOGLOBIN A1C
HEMOGLOBIN A1C: 6.8 % — AB (ref 4.8–5.6)
Mean Plasma Glucose: 148 mg/dL

## 2016-07-02 LAB — APTT: APTT: 36 s (ref 24–36)

## 2016-07-02 MED ORDER — IPRATROPIUM-ALBUTEROL 0.5-2.5 (3) MG/3ML IN SOLN
RESPIRATORY_TRACT | Status: AC
  Filled 2016-07-02: qty 3

## 2016-07-02 MED ORDER — LIDOCAINE HCL (PF) 1 % IJ SOLN
INTRAMUSCULAR | Status: DC | PRN
  Administered 2016-07-02: 15 mL

## 2016-07-02 MED ORDER — AMOXICILLIN 500 MG PO CAPS
500.0000 mg | ORAL_CAPSULE | Freq: Three times a day (TID) | ORAL | Status: DC
  Administered 2016-07-03: 06:00:00 500 mg via ORAL
  Filled 2016-07-02 (×2): qty 1

## 2016-07-02 MED ORDER — LIDOCAINE HCL (PF) 1 % IJ SOLN
INTRAMUSCULAR | Status: AC
  Filled 2016-07-02: qty 10

## 2016-07-02 MED ORDER — FENTANYL CITRATE (PF) 100 MCG/2ML IJ SOLN
INTRAMUSCULAR | Status: AC | PRN
  Administered 2016-07-02: 50 ug via INTRAVENOUS
  Administered 2016-07-02: 25 ug via INTRAVENOUS

## 2016-07-02 MED ORDER — CIPROFLOXACIN HCL 500 MG PO TABS
500.0000 mg | ORAL_TABLET | Freq: Two times a day (BID) | ORAL | Status: DC
  Administered 2016-07-03 – 2016-07-04 (×3): 500 mg via ORAL
  Filled 2016-07-02 (×3): qty 1

## 2016-07-02 MED ORDER — NALOXONE HCL 2 MG/2ML IJ SOSY
PREFILLED_SYRINGE | INTRAMUSCULAR | Status: AC
  Filled 2016-07-02: qty 2

## 2016-07-02 MED ORDER — MIDAZOLAM HCL 5 MG/5ML IJ SOLN
INTRAMUSCULAR | Status: AC
  Filled 2016-07-02: qty 10

## 2016-07-02 MED ORDER — IOHEXOL 300 MG/ML  SOLN
30.0000 mL | Freq: Once | INTRAMUSCULAR | Status: AC | PRN
  Administered 2016-07-02: 30 mL via INTRATHECAL

## 2016-07-02 MED ORDER — KETOROLAC TROMETHAMINE 30 MG/ML IJ SOLN
30.0000 mg | Freq: Four times a day (QID) | INTRAMUSCULAR | Status: DC
  Administered 2016-07-02 – 2016-07-04 (×8): 30 mg via INTRAVENOUS
  Filled 2016-07-02 (×8): qty 1

## 2016-07-02 MED ORDER — FLUMAZENIL 0.5 MG/5ML IV SOLN
INTRAVENOUS | Status: AC
  Filled 2016-07-02: qty 5

## 2016-07-02 MED ORDER — CHLORHEXIDINE GLUCONATE CLOTH 2 % EX PADS
6.0000 | MEDICATED_PAD | Freq: Every day | CUTANEOUS | Status: AC
  Administered 2016-07-02: 6 via TOPICAL

## 2016-07-02 MED ORDER — ACETAMINOPHEN 325 MG PO TABS
650.0000 mg | ORAL_TABLET | Freq: Four times a day (QID) | ORAL | Status: DC
  Administered 2016-07-02 – 2016-07-04 (×8): 650 mg via ORAL
  Filled 2016-07-02 (×8): qty 2

## 2016-07-02 MED ORDER — MIDAZOLAM HCL 5 MG/5ML IJ SOLN
INTRAMUSCULAR | Status: AC | PRN
  Administered 2016-07-02 (×2): 1 mg via INTRAVENOUS

## 2016-07-02 MED ORDER — IPRATROPIUM-ALBUTEROL 0.5-2.5 (3) MG/3ML IN SOLN
3.0000 mL | Freq: Four times a day (QID) | RESPIRATORY_TRACT | Status: DC
  Administered 2016-07-02 (×2): 3 mL via RESPIRATORY_TRACT
  Filled 2016-07-02: qty 3

## 2016-07-02 MED ORDER — IPRATROPIUM-ALBUTEROL 0.5-2.5 (3) MG/3ML IN SOLN
3.0000 mL | Freq: Three times a day (TID) | RESPIRATORY_TRACT | Status: DC
  Administered 2016-07-02 – 2016-07-03 (×2): 3 mL via RESPIRATORY_TRACT
  Filled 2016-07-02 (×2): qty 3

## 2016-07-02 MED ORDER — ENOXAPARIN SODIUM 40 MG/0.4ML ~~LOC~~ SOLN
40.0000 mg | SUBCUTANEOUS | Status: DC
  Administered 2016-07-02 – 2016-07-03 (×2): 40 mg via SUBCUTANEOUS
  Filled 2016-07-02 (×2): qty 0.4

## 2016-07-02 MED ORDER — FENTANYL CITRATE (PF) 100 MCG/2ML IJ SOLN
INTRAMUSCULAR | Status: AC
Start: 2016-07-02 — End: 2016-07-02
  Filled 2016-07-02: qty 4

## 2016-07-02 NOTE — Plan of Care (Signed)
Problem: Pain Managment: Goal: General experience of comfort will improve Outcome: Progressing RUQ pain managed with scheduled toradol and tylenol.  Problem: Physical Regulation: Goal: Ability to maintain clinical measurements within normal limits will improve Outcome: Progressing Pt s/p biliary tube. Surgical dressing dry and intact. 42ml black output via tube.   Problem: Fluid Volume: Goal: Ability to maintain a balanced intake and output will improve Outcome: Progressing Pt tolerates full liquid diet.

## 2016-07-02 NOTE — Progress Notes (Signed)
64 year old male with multiple medical issues including multiple sclerosis for which she is wheelchair bound and has a suprapubic catheter here for urinary tract infection and possible acute cholecystitis. Patient had cholecystostomy tube placed today requiring narcan post procedure.  He is doing well now.  He denies any pain at this time but is very tired.    Vitals:   07/02/16 0951 07/02/16 1132  BP: (!) 168/102 135/85  Pulse: (!) 110 (!) 101  Resp: 15 15  Temp:  97.8 F (36.6 C)   PE:  Gen: NAD Res: CTAB/L Cardio: RRR Abd: soft, obese, tender in RUQ with a firmness just below the costal margin,Cholecystostomy tube in place draining brown material,  suprapubic catheter in place Ext: 2+ pulses, no edema  CBC Latest Ref Rng & Units 07/02/2016 07/01/2016 06/30/2016  WBC 3.8 - 10.6 K/uL 17.4(H) 15.4(H) 19.8(H)  Hemoglobin 13.0 - 18.0 g/dL 40.9 12.7(L) 14.1  Hematocrit 40.0 - 52.0 % 38.2(L) 36.9(L) 40.3  Platelets 150 - 440 K/uL 222 192 187   CMP Latest Ref Rng & Units 07/02/2016 07/01/2016 06/30/2016  Glucose 65 - 99 mg/dL 735(H) 299(M) 426(S)  BUN 6 - 20 mg/dL 15 34(H) 16  Creatinine 0.61 - 1.24 mg/dL 9.62 2.29 7.98  Sodium 135 - 145 mmol/L 141 139 135  Potassium 3.5 - 5.1 mmol/L 3.7 4.4 3.6  Chloride 101 - 111 mmol/L 105 107 99(L)  CO2 22 - 32 mmol/L 24 24 29   Calcium 8.9 - 10.3 mg/dL 8.1(L) 7.7(L) 7.7(L)  Total Protein 6.5 - 8.1 g/dL 7.0 9.2(J) 6.7  Total Bilirubin 0.3 - 1.2 mg/dL 1.9(E) 1.3(H) 1.7(H)  Alkaline Phos 38 - 126 U/L 60 48 46  AST 15 - 41 U/L 48(H) 34 37  ALT 17 - 63 U/L 31 24 29    A/P:  65 year old male with multiple medical issues and MS now with concern for a UTI and acute cholecystitis.  WBC slightly increased cholecystostomy tube placed today.  Will give full liquid diet and recheck labs in AM as well.

## 2016-07-02 NOTE — Care Management Important Message (Signed)
Important Message  Patient Details  Name: Jesus Sanchez MRN: [id number] Date of Birth: 1951-06-18   Medicare Important Message Given:  Yes    Gwenette Greet, RN 07/02/2016, 9:22 AM

## 2016-07-02 NOTE — Progress Notes (Signed)
[facility] - [place] at [facility]   PATIENT NAME: Jesus Sanchez    MR#:  [id number]  DATE OF BIRTH:  1950/08/09  SUBJECTIVE:  CHIEF COMPLAINT:  Patient was seen and examined after the cystostomy tube placement. Patient was lethargic. He has needed 1 dose of naloxone after procedure as he was lethargic. During my examination patient was arousable but still drowsy. Wife is bedside  REVIEW OF SYSTEMS:  Review of systems unobtainable as the patient is still lethargic  DRUG ALLERGIES:   Allergies  Allergen Reactions  . Oxycodone-Acetaminophen Itching    VITALS:  Blood pressure 135/85, pulse (!) 101, temperature 97.8 F (36.6 C), temperature source Oral, resp. rate 15, height 5\' 9"  (1.753 m), weight 90.7 kg (200 lb), SpO2 93 %.  PHYSICAL EXAMINATION:  GENERAL:  65 y.o.-year-old patient lying in the bed with no acute distress.  EYES: Pupils equal, round, reactive to light and accommodation. No scleral icterus.   HEENT: Head atraumatic, normocephalic. Oropharynx and nasopharynx clear.  NECK:  Supple, no jugular venous distention. No thyroid enlargement, no tenderness.  LUNGS: Normal breath sounds bilaterally, no wheezing, rales,rhonchi or crepitation. No use of accessory muscles of respiration.  CARDIOVASCULAR: S1, S2 normal. No murmurs, rubs, or gallops.  ABDOMEN:Status post cholecystostomy tube placement today with a dark-colored fluid in the drain Bowel sounds present. No organomegaly or mass.  EXTREMITIES: No pedal edema, cyanosis, or clubbing.  NEUROLOGIC: Patient is arousable but lethargic. Gait not checked.  PSYCHIATRIC: The patient is arousable but lethargic SKIN: No obvious rash, lesion, or ulcer.    LABORATORY PANEL:   CBC  Recent Labs Lab 07/02/16 0506  WBC 17.4*  HGB 13.0  HCT 38.2*  PLT 222   ------------------------------------------------------------------------------------------------------------------  Chemistries   Recent  Labs Lab 07/02/16 0506  NA 141  K 3.7  CL 105  CO2 24  GLUCOSE 161*  BUN 15  CREATININE 0.94  CALCIUM 8.1*  AST 48*  ALT 31  ALKPHOS 60  BILITOT 1.6*   ------------------------------------------------------------------------------------------------------------------  Cardiac Enzymes No results for input(s): TROPONINI in the last 168 hours. ------------------------------------------------------------------------------------------------------------------  RADIOLOGY:  Ct Abdomen Pelvis W Contrast  Result Date: 07/01/2016 CLINICAL DATA:  One week history of right-sided pain EXAM: CT ABDOMEN AND PELVIS WITH CONTRAST TECHNIQUE: Multidetector CT imaging of the abdomen and pelvis was performed using the standard protocol following bolus administration of intravenous contrast. Oral contrast was also administered. CONTRAST:  ISOVUE-300 IOPAMIDOL (ISOVUE-300) INJECTION 61% COMPARISON:  Ultrasound right upper quadrant June 29, 2016 FINDINGS: Lower chest: There is bibasilar atelectasis with rather minimal pleural effusions bilaterally. There are foci of coronary artery calcification evident. Hepatobiliary: No focal liver lesions are evident. Gallbladder is distended with wall thickening and stranding in the pericholecystic region. There is slight pericholecystic fluid as well. There is no appreciable biliary duct dilatation. Pancreas: There is no pancreatic mass or inflammatory focus. Spleen: No splenic lesions are evident. Adrenals/Urinary Tract: Adrenals appear normal bilaterally. Kidneys show evidence of fetal lobulation bilaterally, an anatomic variant. There is no renal mass or hydronephrosis on either side. There is no renal or ureteral calculus on either side. Urinary bladder is decompressed with a catheter. The urinary bladder wall thickness is difficult to assess in this circumstance. Stomach/Bowel: There is no appreciable bowel wall or mesenteric thickening. There are scattered  colonic diverticula without diverticulitis. There is no bowel obstruction. No free air or portal venous air. A loop of colon extends to the margin of a left inguinal hernia but  does not see extend appreciably into this hernia. There is no bowel compromise in this area. Vascular/Lymphatic: There is atherosclerotic calcification in the aorta and common iliac arteries. No aneurysm evident. Major mesenteric vessels appear patent. There is no appreciable adenopathy in the abdomen or pelvis. Reproductive: Prostate contains multiple small calculi. Prostate and seminal vesicles are normal in size and contour. There is no pelvic mass or pelvic fluid collection. Other: Appendix appears normal. No ascites or abscess is evident in the abdomen or pelvis. There is a left inguinal hernia which contains fat. Bowel extends to the immediate proximal aspect of the hernia without appreciable entry of bowel into the hernia. There is a smaller inguinal hernia on the right containing only fat. There is a small ventral hernia containing only fat. Musculoskeletal: A stimulator device is present in the lateral left mid to lower abdomen. No leads from this device are evident. There is evidence of an old healed fracture of the posterior left ischium. There is arthropathy in both hip joints. There is arthropathy in the lumbar spine. There are no blastic or lytic bone lesions. There is a small apparent bone island in the left inferior iliac crest. There is no intramuscular or abdominal wall lesions. IMPRESSION: Findings indicative of a degree of acute cholecystitis. No renal or ureteral calculus. No hydronephrosis. Urinary bladder is decompressed with a catheter. Urinary bladder wall cannot be accurately assessed in this circumstance. No bowel obstruction.  No abscess.  Appendix appears normal. There is aortoiliac atherosclerosis. There is multifocal coronary artery calcification. Small pleural effusions bilaterally with bibasilar atelectasis.  There is an inguinal hernia on the left which contains mainly fat but minimal bowel at its origin. No bowel compromise. There is a smaller right inguinal hernia. There is also a small ventral hernia containing only fat. Electronically Signed   By: Bretta BangWilliam  Woodruff III M.D.   On: 07/01/2016 13:38    EKG:   Orders placed or performed during the hospital encounter of 06/29/16  . EKG 12-Lead  . EKG 12-Lead    ASSESSMENT AND PLAN:     #Acute cystitis with supra pubic bladder catheter Urine culture > thousand colonies of Enterobacter and enterococcus fecalis. Currently patient is on IV Zosyn. Change patient to ciprofloxacin and amoxicillin from a.m.  IV fluids  #Diabetes mellitus type 2 Currently patient is on clear liquid diet and lethargic from the sedation  Continue Accu-Cheks every 4 hours and sliding scale insulin will be provided Follow-up with diabetic coordinator  #History of coronary artery disease status post stent placement The patient is asymptomatic sees Dr.kOWALSKI  as an outpatient According to the wife patient just seen and evaluated by cardiology 2 months ago known uric medicines at this time  #Chronic history of multiple sclerosis wheelchair-bound, neurogenic bladder Hold his medications for multiple sclerosis at this time   #Acute cholecystitis with  leukocytosis cholecystostomy tube by IR placed today Patient is on clear liquids and, IV fluids Pain management per surgery  DVT prophylaxis with SCDs resume Lovenox All the records are reviewed and case discussed with Care Management/Social Workerr. Management plans discussed with the patient, family and they are in agreement.   TOTAL TIME TAKING CARE OF THIS PATIENT: 35 minutes.    Note: This dictation was prepared with Dragon dictation along with smaller phrase technology. Any transcriptional errors that result from this process are unintentional.   Ramonita LabGouru, Delford Wingert M.D on 07/02/2016 at 3:54 PM  Between  7am to 6pm - Pager - [phone] After 6pm  go to [email or web address] - password [email or web address] [facility]  [place] [facility]  Office  [phone]  CC: Primary care physician; Lauro Regulus., MD

## 2016-07-02 NOTE — Procedures (Addendum)
Successful placement of 10 French drain in gallbladder.  Dark bile draining from gallbladder, sample sent for culture.   Patient was not arousable at end of procedure despite remaining hemodynamically stable.  Therefore, the sedation was reversed with 0.4 mg of Narcan.  Patient was aroused soon after the reversal agent was given.     See full report in Imaging section.

## 2016-07-02 NOTE — Progress Notes (Signed)
Pt. Became unresponsive and O2 level decreased to 60% during procedure after sedation.  Re-breathing mask applied/jaw-thrust.  Back onto bed and HOB raised.  To recovery.  Narcan 0.4 mg IV given.  FSBS-150's. Pt. Became responsive and spontaneous regular breathing. Alert but confused.  Restless.  Breathing treatment done.  Wife in with pt. VSS.

## 2016-07-03 DIAGNOSIS — K8000 Calculus of gallbladder with acute cholecystitis without obstruction: Principal | ICD-10-CM

## 2016-07-03 LAB — CBC WITH DIFFERENTIAL/PLATELET
Basophils Absolute: 0 10*3/uL (ref 0–0.1)
Basophils Relative: 0 %
EOS ABS: 0.3 10*3/uL (ref 0–0.7)
EOS PCT: 3 %
HCT: 34 % — ABNORMAL LOW (ref 40.0–52.0)
Hemoglobin: 12 g/dL — ABNORMAL LOW (ref 13.0–18.0)
LYMPHS ABS: 1.9 10*3/uL (ref 1.0–3.6)
LYMPHS PCT: 18 %
MCH: 34.3 pg — AB (ref 26.0–34.0)
MCHC: 35.1 g/dL (ref 32.0–36.0)
MCV: 97.7 fL (ref 80.0–100.0)
Monocytes Absolute: 1 10*3/uL (ref 0.2–1.0)
Monocytes Relative: 10 %
Neutro Abs: 7.1 10*3/uL — ABNORMAL HIGH (ref 1.4–6.5)
Neutrophils Relative %: 69 %
PLATELETS: 208 10*3/uL (ref 150–440)
RBC: 3.48 MIL/uL — ABNORMAL LOW (ref 4.40–5.90)
RDW: 13.9 % (ref 11.5–14.5)
WBC: 10.3 10*3/uL (ref 3.8–10.6)

## 2016-07-03 LAB — HEMOGLOBIN A1C
HEMOGLOBIN A1C: 6.7 % — AB (ref 4.8–5.6)
MEAN PLASMA GLUCOSE: 146 mg/dL

## 2016-07-03 LAB — GLUCOSE, CAPILLARY
GLUCOSE-CAPILLARY: 118 mg/dL — AB (ref 65–99)
GLUCOSE-CAPILLARY: 140 mg/dL — AB (ref 65–99)
Glucose-Capillary: 135 mg/dL — ABNORMAL HIGH (ref 65–99)
Glucose-Capillary: 130 mg/dL — ABNORMAL HIGH (ref 65–99)
Glucose-Capillary: 193 mg/dL — ABNORMAL HIGH (ref 65–99)
Glucose-Capillary: 177 mg/dL — ABNORMAL HIGH (ref 65–99)
Glucose-Capillary: 102 mg/dL — ABNORMAL HIGH (ref 65–99)

## 2016-07-03 MED ORDER — AMOXICILLIN-POT CLAVULANATE 500-125 MG PO TABS
1.0000 | ORAL_TABLET | Freq: Three times a day (TID) | ORAL | Status: DC
  Administered 2016-07-03 – 2016-07-04 (×4): 500 mg via ORAL
  Filled 2016-07-03 (×5): qty 1

## 2016-07-03 MED ORDER — IPRATROPIUM-ALBUTEROL 0.5-2.5 (3) MG/3ML IN SOLN: 3.0000 mL | Freq: Four times a day (QID) | RESPIRATORY_TRACT | Status: DC | PRN

## 2016-07-03 NOTE — Progress Notes (Signed)
65 year old male with multiple medical issues including multiple sclerosis for which she is wheelchair bound and has a suprapubic catheter here for urinary tract infection and possible acute cholecystitis. Patient states feeling much better.  He is sitting up in bed conversing with a friend and much more lucid today.  No episodes of confusion or losing words.  He states that his abdomen is pain free and that he feels better.  He tolerated the full liquids without any nausea or pain.   Vitals:   07/03/16 0517 07/03/16 0830  BP: 116/67 131/78  Pulse: 88 90  Resp: (!) 21 20  Temp: 98.5 F (36.9 C) 97.7 F (36.5 C)   PE:  Gen: NAD Res: CTAB/L Cardio: RRR Abd: soft, obese, non tender in RUQ, previous firmness has now resolved,Cholecystostomy tube in place draining brown material,  suprapubic catheter in place Ext: 2+ pulses, no edema  CBC Latest Ref Rng & Units 07/03/2016 07/02/2016 07/01/2016  WBC 3.8 - 10.6 K/uL 10.3 17.4(H) 15.4(H)  Hemoglobin 13.0 - 18.0 g/dL 12.0(L) 13.0 12.7(L)  Hematocrit 40.0 - 52.0 % 34.0(L) 38.2(L) 36.9(L)  Platelets 150 - 440 K/uL 208 222 192   CMP Latest Ref Rng & Units 07/02/2016 07/02/2016 07/01/2016  Glucose 65 - 99 mg/dL 561(B) 379(K) 327(M)  BUN 6 - 20 mg/dL 16 15 14(J)  Creatinine 0.61 - 1.24 mg/dL 0.92 9.57 4.73  Sodium 135 - 145 mmol/L 137 141 139  Potassium 3.5 - 5.1 mmol/L 3.6 3.7 4.4  Chloride 101 - 111 mmol/L 104 105 107  CO2 22 - 32 mmol/L 27 24 24   Calcium 8.9 - 10.3 mg/dL 7.6(L) 8.1(L) 7.7(L)  Total Protein 6.5 - 8.1 g/dL 6.2(L) 7.0 6.4(L)  Total Bilirubin 0.3 - 1.2 mg/dL 1.1 4.0(Z) 1.3(H)  Alkaline Phos 38 - 126 U/L 49 60 48  AST 15 - 41 U/L 38 48(H) 34  ALT 17 - 63 U/L 27 31 24    A/P:  65 year old male with multiple medical issues and MS now with concern for a UTI and acute cholecystitis.  His WBC has normalized and patient much improved today.  I will advance his diet to solid foods today.  I have discussed with Dr. Amado Coe of medicine as  well who will change antibiotics to PO to treat both UTI and gallbladder, Augmentin and Cipro today.  If patient tolerates diet well likely discharge home later today or tomorrow.

## 2016-07-03 NOTE — Progress Notes (Signed)
[facility] - [place] [facility]   PATIENT NAME: Jesus StarringWalter Sanchez    MR#:  [id number]  DATE OF BIRTH:  04/03/1951  SUBJECTIVE:  CHIEF COMPLAINT:  Patient is at his base line today, abd pain improved and tolerating liquids  REVIEW OF SYSTEMS:  Review of systems  CONSTITUTIONAL: No fever, fatigue or weakness.  EYES: No blurred or double vision.  EARS, NOSE, AND THROAT: No tinnitus or ear pain.  RESPIRATORY: No cough, shortness of breath, wheezing or hemoptysis.  CARDIOVASCULAR: No chest pain, orthopnea, edema.  GASTROINTESTINAL: No nausea, vomiting, diarrhea or abdominal pain. Cholecystostomy tube with dark liquid  GENITOURINARY: No dysuria, hematuria.  ENDOCRINE: No polyuria, nocturia,  HEMATOLOGY: No anemia, easy bruising or bleeding SKIN: No rash or lesion. MUSCULOSKELETAL: No joint pain or arthritis.   NEUROLOGIC: No tingling, numbness, weakness.  PSYCHIATRY: No anxiety or depression.    DRUG ALLERGIES:   Allergies  Allergen Reactions  . Oxycodone-Acetaminophen Itching    VITALS:  Blood pressure 134/86, pulse 80, temperature 97.6 F (36.4 C), temperature source Oral, resp. rate 20, height 5\' 9"  (1.753 m), weight 90.7 kg (200 lb), SpO2 94 %.  PHYSICAL EXAMINATION:  GENERAL:  65 y.o.-year-old patient lying in the bed with no acute distress.  EYES: Pupils equal, round, reactive to light and accommodation. No scleral icterus.   HEENT: Head atraumatic, normocephalic. Oropharynx and nasopharynx clear.  NECK:  Supple, no jugular venous distention. No thyroid enlargement, no tenderness.  LUNGS: Normal breath sounds bilaterally, no wheezing, rales,rhonchi or crepitation. No use of accessory muscles of respiration.  CARDIOVASCULAR: S1, S2 normal. No murmurs, rubs, or gallops.  ABDOMEN:Status post cholecystostomy tube placement  with a dark-colored fluid in the drain Bowel sounds present. No organomegaly or mass.  EXTREMITIES: No pedal edema, cyanosis,  or clubbing.  NEUROLOGIC: Patient is arousable but lethargic. Gait not checked.  PSYCHIATRIC: The patient is arousable but lethargic SKIN: No obvious rash, lesion, or ulcer.    LABORATORY PANEL:   CBC  Recent Labs Lab 07/03/16 0454  WBC 10.3  HGB 12.0*  HCT 34.0*  PLT 208   ------------------------------------------------------------------------------------------------------------------  Chemistries   Recent Labs Lab 07/02/16 1627  NA 137  K 3.6  CL 104  CO2 27  GLUCOSE 170*  BUN 16  CREATININE 0.81  CALCIUM 7.6*  AST 38  ALT 27  ALKPHOS 49  BILITOT 1.1   ------------------------------------------------------------------------------------------------------------------  Cardiac Enzymes No results for input(s): TROPONINI in the last 168 hours. ------------------------------------------------------------------------------------------------------------------  RADIOLOGY:  Ir Perc Cholecystostomy  Result Date: 07/02/2016 INDICATION: 65 year old with multiple medical problems including acute cholecystitis. Patient is not a candidate for cholecystectomy at this time. EXAM: PERCUTANEOUS CHOLECYSTOSTOMY WITH FLUOROSCOPIC AND ULTRASOUND GUIDANCE MEDICATIONS: Narcan 0.4 mg post procedure ANESTHESIA/SEDATION: Moderate (conscious) sedation was employed during this procedure. A total of Versed 2.0 mg and Fentanyl 75 mcg was administered intravenously. Moderate Sedation Time: 27 minutes. The patient's level of consciousness and vital signs were monitored continuously by radiology nursing throughout the procedure under my direct supervision. FLUOROSCOPY TIME:  Fluoroscopy Time: 56 seconds, 12.8 mGy COMPLICATIONS: SIR LEVEL B - Normal therapy, includes overnight admission for observation. Over sedation and requiring reversal agent. PROCEDURE: Informed written consent was obtained from the patient after a thorough discussion of the procedural risks, benefits and alternatives. All  questions were addressed. Maximal Sterile Barrier Technique was utilized including caps, mask, sterile gowns, sterile gloves, sterile drape, hand hygiene and skin antiseptic. A timeout was performed prior to the initiation of the  procedure. Patient was placed supine on the interventional table. The right upper abdomen was prepped and draped in a sterile fashion. Ultrasound was used to identify the gallbladder. Skin was anesthetized with 1% lidocaine. A 21 gauge needle was directed into the gallbladder from a transhepatic approach. Needle position was confirmed within the gallbladder with ultrasound. Dark bile was rapidly draining from the 21 gauge needle. A 0.018 wire was advanced to the gallbladder. The tract was dilated with an Accustick dilator set. Tract was dilated to accommodate a 10.2 [place] multipurpose drain. The drain was easily advanced into the gallbladder over the J wire. Large amount of dark bile was aspirated and a sample sent for culture. Catheter was sutured to skin. Bandage placed over the drain. Near the end of the procedure, the patient's O2 saturations were decreasing and the patient was difficult to arouse. Patient's oxygen saturations stayed above 90% with a non re-breather mask. Heart rate and blood pressure remained stable throughout the procedure. Following the procedure, the patient was transferred to the nursing recovery area. The patient's blood pressure and oxygen saturations remained normal but the patient was not arousable. As a result, the patient was given 0.4 mg of Narcan IV. Shortly after the Narcan, the patient was arousable with physical stimulation and woke up. Patient remained hemodynamically stable. IMPRESSION: Successful placement of a percutaneous cholecystostomy tube with ultrasound and fluoroscopic guidance. The procedure was complicated by over sedation and requiring a reversal agent following the procedure. Electronically Signed   By: Richarda Overlie M.D.   On: 07/02/2016  16:21    EKG:   Orders placed or performed during the hospital encounter of 06/29/16  . EKG 12-Lead  . EKG 12-Lead    ASSESSMENT AND PLAN:     #Acute cystitis with supra pubic bladder catheter Urine culture > thousand colonies of Enterobacter and enterococcus fecalis. Treated with zosyn. Change patient to ciprofloxacin and augmentin, d/w dr.Loflin  IV fluids  #Diabetes mellitus type 2 Advancing diet as tolerated, if tolerates will resume metformin tomirrow  Continue Accu-Cheks q ac hs  and sliding scale insulin will be provided Follow-up with diabetic coordinator  #History of coronary artery disease status post stent placement The patient is asymptomatic sees Dr.kOWALSKI  as an outpatient Continue home meds asa and statins According to the wife patient just seen and evaluated by cardiology 2 months ago known uric medicines at this time  #Chronic history of multiple sclerosis wheelchair-bound, neurogenic bladder Hold his medications for multiple sclerosis at this time, resume at d/c   #Acute cholecystitis with  leukocytosis cholecystostomy tube by IR placed 12/15 Patient is on clear liquids and, IV fluids Pain management per surgery  DVT prophylaxis with SCDs resume Lovenox All the records are reviewed and case discussed with Care Management/Social Workerr. Management plans discussed with the patient, family and they are in agreement.   TOTAL TIME TAKING CARE OF THIS PATIENT: 35 minutes.    Note: This dictation was prepared with Dragon dictation along with smaller phrase technology. Any transcriptional errors that result from this process are unintentional.   Ramonita Lab M.D on 07/03/2016 at 5:11 PM  Between 7am to 6pm - Pager - [phone] After 6pm go to [email or web address] - password [email or web address] [facility]  [place] [facility]  Office  [phone]  CC: Primary care physician; Lauro Regulus., MD

## 2016-07-04 LAB — GLUCOSE, CAPILLARY
GLUCOSE-CAPILLARY: 105 mg/dL — AB (ref 65–99)
GLUCOSE-CAPILLARY: 117 mg/dL — AB (ref 65–99)

## 2016-07-04 MED ORDER — CIPROFLOXACIN HCL 500 MG PO TABS: 500.0000 mg | ORAL_TABLET | Freq: Two times a day (BID) | ORAL | 0 refills | Status: DC

## 2016-07-04 MED ORDER — AMOXICILLIN-POT CLAVULANATE 500-125 MG PO TABS: 1.0000 | ORAL_TABLET | Freq: Three times a day (TID) | ORAL | 0 refills | Status: DC

## 2016-07-04 NOTE — Progress Notes (Signed)
Pt has been discharged home. Discharge instructions given and explained to pt and pt's spouse. Both verbalized understanding. F/u appointment and meds reviewed with pt.

## 2016-07-04 NOTE — Discharge Summary (Signed)
Physician Discharge Summary  Patient ID: Jesus Sanchez [facility] MRN: [id number] DOB/AGE: [birth year] 65 y.o.  Admit date: 06/29/2016 Discharge date: 07/04/2016  Admission Diagnoses:  Acute cholecystitis and UTI  Discharge Diagnoses:  Active Problems:   Cholecystitis   Discharged Condition: good  Hospital Course: 65 yr old male with MS in wheelchair and Superpubic cath came in with acute cholecystitis and UTI.  He was treated with antibiotics and cholecystostomy tube placement. His WBC and pain have resolved.  His wife has been instructed how to empty and measure bag and change dressing daily.  He will return to see us in the office next week.  He is placed on Augmentin and Cipro to cover the acute cholecystitis and UTI.   Consults: Internal Medicine  Significant Diagnostic Studies: CT scan, U/S  Treatments: antibiotics: Zosyn and Cholecystostomy tube placement  Discharge Exam: Blood pressure (!) 160/93, pulse 73, temperature 97.9 F (36.6 C), temperature source Oral, resp. rate 20, height 5\' 9"  (1.753 m), weight 200 lb (90.7 kg), SpO2 98 %. General appearance: alert, cooperative and no distress GI: soft, non tender, firmness in RUQ resolved, Biliary tube in place, site clean draining yellowish material, Extremities: extremities normal, atraumatic, no cyanosis or edema  Disposition:   Discharge Instructions    Call MD for:  difficulty breathing, headache or visual disturbances    Complete by:  As directed    Call MD for:  persistant nausea and vomiting    Complete by:  As directed    Call MD for:  redness, tenderness, or signs of infection (pain, swelling, redness, odor or green/yellow discharge around incision site)    Complete by:  As directed    Call MD for:  severe uncontrolled pain    Complete by:  As directed    Call MD for:  temperature >100.4    Complete by:  As directed    Change dressing (specify)    Complete by:  As directed    Dressing change: 1 times per day  using dry gauze over tube insertion site.   Diet - low sodium heart healthy    Complete by:  As directed    Discharge instructions    Complete by:  As directed    Please empty and measure the output from drain daily   Increase activity slowly    Complete by:  As directed    May shower / Bathe    Complete by:  As directed      Allergies as of 07/04/2016      Reactions   Oxycodone-acetaminophen Itching      Medication List    TAKE these medications   amoxicillin-clavulanate 500-125 MG tablet Commonly known as:  AUGMENTIN Take 1 tablet (500 mg total) by mouth 3 (three) times daily.   aspirin EC 81 MG tablet Take 81 mg by mouth.   atorvastatin 10 MG tablet Commonly known as:  LIPITOR Take 10 mg by mouth daily.   baclofen 4098140000 MCG/20ML Soln Commonly known as:  GABLOFEN by Intrathecal route.   ciprofloxacin 500 MG tablet Commonly known as:  CIPRO Take 1 tablet (500 mg total) by mouth 2 (two) times daily.   citalopram 40 MG tablet Commonly known as:  CELEXA Take by mouth.   gabapentin 300 MG capsule Commonly known as:  NEURONTIN Take 900 mg by mouth. 900 mg at hs 600mg   in am   losartan-hydrochlorothiazide 100-25 MG tablet Commonly known as:  HYZAAR Take 1 tablet by mouth daily.   metFORMIN  500 MG tablet Commonly known as:  GLUCOPHAGE Take 1,000 mg by mouth 2 (two) times daily with a meal.   oxybutynin 5 MG tablet Commonly known as:  DITROPAN Take 5 mg by mouth every 8 (eight) hours as needed.   pantoprazole 40 MG tablet Commonly known as:  PROTONIX Take 40 mg by mouth 2 (two) times daily.   TYSABRI 300 MG/15ML injection Generic drug:  natalizumab Inject into the vein.      Follow-up Information    Gladis Riffle, MD Follow up on 07/07/2016.   Specialty:  Surgery Why:  f/u with Dr. Orvis Brill on Wed 12/20 at 9:30AM Contact information: [address] [place] [address] [phone]           Signed: Gladis Riffle 07/04/2016, 9:11 AM

## 2016-07-04 NOTE — Progress Notes (Signed)
[facility] [place] [facility]   PATIENT NAME: Jesus Sanchez    MR#:  [id number]  DATE OF BIRTH:  1951-03-22  SUBJECTIVE:  CHIEF COMPLAINT:  Patient is Doing much better. Tolerating diet. Surgery planning to discharge patient today  REVIEW OF SYSTEMS:  Review of systems  CONSTITUTIONAL: No fever, fatigue or weakness.  EYES: No blurred or double vision.  EARS, NOSE, AND THROAT: No tinnitus or ear pain.  RESPIRATORY: No cough, shortness of breath, wheezing or hemoptysis.  CARDIOVASCULAR: No chest pain, orthopnea, edema.  GASTROINTESTINAL: No nausea, vomiting, diarrhea or abdominal pain. Cholecystostomy tubeIntact with dark liquid GENITOURINARY: No dysuria, hematuria.  ENDOCRINE: No polyuria, nocturia,  HEMATOLOGY: No anemia, easy bruising or bleeding SKIN: No rash or lesion. MUSCULOSKELETAL: No joint pain or arthritis.   NEUROLOGIC: No tingling, numbness, weakness.  PSYCHIATRY: No anxiety or depression.    DRUG ALLERGIES:   Allergies  Allergen Reactions  . Oxycodone-Acetaminophen Itching    VITALS:  Blood pressure (!) 160/93, pulse 73, temperature 97.9 F (36.6 C), temperature source Oral, resp. rate 20, height 5\' 9"  (1.753 m), weight 90.7 kg (200 lb), SpO2 98 %.  PHYSICAL EXAMINATION:  GENERAL:  65 y.o.-year-old patient lying in the bed with no acute distress.  EYES: Pupils equal, round, reactive to light and accommodation. No scleral icterus.   HEENT: Head atraumatic, normocephalic. Oropharynx and nasopharynx clear.  NECK:  Supple, no jugular venous distention. No thyroid enlargement, no tenderness.  LUNGS: Normal breath sounds bilaterally, no wheezing, rales,rhonchi or crepitation. No use of accessory muscles of respiration.  CARDIOVASCULAR: S1, S2 normal. No murmurs, rubs, or gallops.  ABDOMEN:Status post cholecystostomy tube placement  with a dark-colored fluid in the drain Bowel sounds present. No organomegaly or mass.  EXTREMITIES:  No pedal edema, cyanosis, or clubbing.  NEUROLOGIC: Patient is arousable but lethargic. Gait not checked.  PSYCHIATRIC: The patient is arousable but lethargic SKIN: No obvious rash, lesion, or ulcer.    LABORATORY PANEL:   CBC  Recent Labs Lab 07/03/16 0454  WBC 10.3  HGB 12.0*  HCT 34.0*  PLT 208   ------------------------------------------------------------------------------------------------------------------  Chemistries   Recent Labs Lab 07/02/16 1627  NA 137  K 3.6  CL 104  CO2 27  GLUCOSE 170*  BUN 16  CREATININE 0.81  CALCIUM 7.6*  AST 38  ALT 27  ALKPHOS 49  BILITOT 1.1   ------------------------------------------------------------------------------------------------------------------  Cardiac Enzymes No results for input(s): TROPONINI in the last 168 hours. ------------------------------------------------------------------------------------------------------------------  RADIOLOGY:  No results found.  EKG:   Orders placed or performed during the hospital encounter of 06/29/16  . EKG 12-Lead  . EKG 12-Lead    ASSESSMENT AND PLAN:     #Acute cystitis with supra pubic bladder catheter Urine culture > thousand colonies of Enterobacter and enterococcus fecalis. Treated with zosyn. Change patient to ciprofloxacin and augmentin, d/w dr.Loflin  IV fluidsDiscontinued  #Diabetes mellitus type 2 Advancing diet as tolerated, if tolerates will resume metformin tomirrow  Continue Accu-Cheks q ac hs  and sliding scale insulin will be provided Follow-up with diabetic coordinator  #History of coronary artery disease status post stent placement The patient is asymptomatic sees Dr.kOWALSKI  as an outpatient Continue home meds asa and statins According to the wife patient just seen and evaluated by cardiology 2 months ago known uric medicines at this time  #Chronic history of multiple sclerosis wheelchair-bound, neurogenic bladder Hold his medications  for multiple sclerosis at this time, resume at d/c   #Acute  cholecystitis with  leukocytosis cholecystostomy tube by IR placed 12/15 Patient is Tolerating diet Pain management per surgery  We will sign off  All the records are reviewed and case discussed with Care Management/Social Workerr. Management plans discussed with the patient, family and they are in agreement.   TOTAL TIME TAKING CARE OF THIS PATIENT: 32 minutes.    Note: This dictation was prepared with Dragon dictation along with smaller phrase technology. Any transcriptional errors that result from this process are unintentional.   Ramonita Lab M.D on 07/04/2016 at 2:26 PM  Between 7am to 6pm - Pager - [phone] After 6pm go to [email or web address] - password [email or web address] [facility]  [facility]  Office  [phone]  CC: Primary care physician; Lauro Regulus., MD

## 2016-07-07 ENCOUNTER — Ambulatory Visit (INDEPENDENT_AMBULATORY_CARE_PROVIDER_SITE_OTHER): Payer: Medicare Other | Admitting: Surgery

## 2016-07-07 ENCOUNTER — Encounter: Payer: Self-pay | Admitting: Surgery

## 2016-07-07 VITALS — BP 164/90 | HR 69 | Temp 97.9°F

## 2016-07-07 DIAGNOSIS — K810 Acute cholecystitis: Secondary | ICD-10-CM

## 2016-07-07 LAB — AEROBIC/ANAEROBIC CULTURE (SURGICAL/DEEP WOUND)
CULTURE: NO GROWTH
GRAM STAIN: NONE SEEN

## 2016-07-07 MED ORDER — ONDANSETRON 4 MG PO TBDP: 4.0000 mg | ORAL_TABLET | Freq: Three times a day (TID) | ORAL | 0 refills | Status: DC | PRN

## 2016-07-07 NOTE — Progress Notes (Signed)
Outpatient Surgical Follow Up  07/07/2016  Jesus Sanchez [facility] is an 65 y.o. male seen for the diagnosis of Cholecystitis, acute [K81.0].  HPI: 65 year old male with a history of multiple medical issues including MS and a suprapubic catheter was recently admitted to the hospital with acute cholecystitis and a cholecystostomy tube placed. The patient is doing well today and has continued drainage from the area. The patient is still having some nausea especially in the morning time which seems to have not improved. The patient also is having softer stools about 3-4 a day. He is still taking the antibiotic without any issues other than those mentioned above. He has not actually had any vomiting and is unsure if the Protonix helps with his nausea. He has been drinking plenty of water and is attempting to keep up with his fluid intake.  Past Medical History:  Diagnosis Date  . Depression   . Diabetes mellitus without complication (HCC)   . GERD (gastroesophageal reflux disease)   . Hypertension   . MS (multiple sclerosis) (HCC) 10/02/2015  . Neuromuscular disorder [facility])     Past Surgical History:  Procedure Laterality Date  . HERNIA REPAIR  2014   [facility]- Dr. Egbert Garibaldi  . IR GENERIC HISTORICAL  07/02/2016   IR PERC CHOLECYSTOSTOMY 07/02/2016 Richarda Overlie, MD [facility]-INTERV RAD    Family History  Problem Relation Age of Onset  . Ulcerative colitis Mother   . Diabetes Mother   . Diabetes Sister   . Colon polyps Sister     Social History:  reports that he has quit smoking. He has a 50.00 pack-year smoking history. He quit smokeless tobacco use about 37 years ago. He reports that he does not drink alcohol or use drugs.  Allergies:  Allergies  Allergen Reactions  . Oxycodone-Acetaminophen Itching    Medications reviewed.  Physical Exam:  BP (!) 164/90   Pulse 69   Temp 97.9 F (36.6 C) (Oral)   Gen: patient resting comfortably in clinic, no cardiovascular or respiratory distress Res:  CTAB/L  Cardio: RRR, no murmur Abd: obese, soft, cholecystostomy tube in place without any redness or drainage around catheter, yellow-brown bile in catheter Ext: no swelling  No results found for this or any previous visit (from the past 48 hour(s)). No results found.  Assessment/Plan: Jesus Sanchez is an 65 y.o. male seen for the diagnosis of Cholecystitis, acute [K81.0]. He is overall doing well and having no further pain in his right upper quadrant. He has 10 more days of antibiotics left and has been given some Zofran for the nausea. He was instructed to keep up with drinking plenty of fluids as some dehydration may be playing a role from the diarrhea. Additionally he is to continue taking his Protonix twice daily. I will have him follow-up with one of my partners in about 3 weeks to ensure continued improvement after the antibiotics were taken off. He will need a cholangiogram through the tube at 6 weeks and then follow-up with me after that. Patient and wife are given opportunity to ask questions and have them answered to their satisfaction today  Santina Evans L. Saree Krogh MD General Surgeon  07/07/2016,3:17 PM

## 2016-07-07 NOTE — Patient Instructions (Addendum)
You will need to have a "T-Tube Cholangiogram" in 6 weeks to evaluate whether it is ok to remove drain. I will call you with the scheduled date. Nothing to eat the night prior to this testing being done.  We will have you follow-up with Dr. Tonita Cong and Dr. Orvis Brill as scheduled below.

## 2016-07-09 ENCOUNTER — Inpatient Hospital Stay: Payer: Medicare Other

## 2016-07-16 ENCOUNTER — Inpatient Hospital Stay: Payer: Medicare Other

## 2016-07-20 ENCOUNTER — Telehealth: Payer: Self-pay | Admitting: General Surgery

## 2016-07-20 DIAGNOSIS — R1011 Right upper quadrant pain: Principal | ICD-10-CM

## 2016-07-20 DIAGNOSIS — G8929 Other chronic pain: Secondary | ICD-10-CM

## 2016-07-20 NOTE — Telephone Encounter (Signed)
Returned call to patient. He denies Nausea/Vomiting, Fever/Chills. Bowel movements normal. Cholecystostomy Drain is draining 10-30cc daily. Pain in RUQ is same pain as prior to cholecystostomy drain placement but is not as severe. Patient's appetite is ok. Patient's last dose of antibiotics was on 07/17/16.  Spoke with Dr. Aleen Campi at this time. He has ordered CBC and CMP for patient STAT and then if either are abnormal plan is to do STAT CT Scan of Abdomen and Pelvis and have patient follow-up in office with Dr. Excell Seltzer tomorrow. If normal, will keep appointment with Dr. Tonita Cong as scheduled and will call back in if pain persists or becomes worse.  Lab orders placed.  Returned phone call to patient's wife, Jesus Sanchez. No answer. Left voicemail for return phone call.

## 2016-07-20 NOTE — Telephone Encounter (Signed)
Patient is having some discomfort where the catheter is for his gallbladder. Is this normal?

## 2016-07-21 ENCOUNTER — Telehealth: Payer: Self-pay

## 2016-07-21 ENCOUNTER — Other Ambulatory Visit
Admission: RE | Admit: 2016-07-21 | Discharge: 2016-07-21 | Disposition: A | Discharge status: Home / Self Care | Payer: Medicare Other | Source: Ambulatory Visit | Attending: General Surgery | Admitting: General Surgery

## 2016-07-21 DIAGNOSIS — G8929 Other chronic pain: Secondary | ICD-10-CM | POA: Diagnosis present

## 2016-07-21 DIAGNOSIS — R1011 Right upper quadrant pain: Secondary | ICD-10-CM | POA: Insufficient documentation

## 2016-07-21 LAB — COMPREHENSIVE METABOLIC PANEL
ALT: 45 U/L (ref 17–63)
AST: 50 U/L — AB (ref 15–41)
Albumin: 3.9 g/dL (ref 3.5–5.0)
Alkaline Phosphatase: 69 U/L (ref 38–126)
Anion gap: 11 (ref 5–15)
BUN: 9 mg/dL (ref 6–20)
CO2: 29 mmol/L (ref 22–32)
CREATININE: 0.7 mg/dL (ref 0.61–1.24)
Calcium: 8.6 mg/dL — ABNORMAL LOW (ref 8.9–10.3)
Chloride: 91 mmol/L — ABNORMAL LOW (ref 101–111)
GFR calc non Af Amer: >60 mL/min (ref >60)
Glucose, Bld: 271 mg/dL — ABNORMAL HIGH (ref 65–99)
Potassium: 3.2 mmol/L — ABNORMAL LOW (ref 3.5–5.1)
SODIUM: 131 mmol/L — AB (ref 135–145)
Total Bilirubin: 1 mg/dL (ref 0.3–1.2)
Total Protein: 7.6 g/dL (ref 6.5–8.1)

## 2016-07-21 LAB — CBC WITH DIFFERENTIAL/PLATELET
Basophils Absolute: 0.1 10*3/uL (ref 0–0.1)
Basophils Relative: 1 %
EOS ABS: 0.5 10*3/uL (ref 0–0.7)
Eosinophils Relative: 5 %
HCT: 47.2 % (ref 40.0–52.0)
HEMOGLOBIN: 16.1 g/dL (ref 13.0–18.0)
LYMPHS ABS: 2.4 10*3/uL (ref 1.0–3.6)
Lymphocytes Relative: 23 %
MCH: 32.6 pg (ref 26.0–34.0)
MCHC: 34.1 g/dL (ref 32.0–36.0)
MCV: 95.4 fL (ref 80.0–100.0)
MONOS PCT: 7 %
Monocytes Absolute: 0.7 10*3/uL (ref 0.2–1.0)
NEUTROS PCT: 64 %
Neutro Abs: 6.9 10*3/uL — ABNORMAL HIGH (ref 1.4–6.5)
Platelets: 197 10*3/uL (ref 150–440)
RBC: 4.95 MIL/uL (ref 4.40–5.90)
RDW: 13.5 % (ref 11.5–14.5)
WBC: 10.6 10*3/uL (ref 3.8–10.6)

## 2016-07-21 MED ORDER — AMOXICILLIN-POT CLAVULANATE 875-125 MG PO TABS: 1.0000 | ORAL_TABLET | Freq: Two times a day (BID) | ORAL | 0 refills | Status: DC

## 2016-07-21 NOTE — Telephone Encounter (Signed)
T-Tube cholangiogram is scheduled on 08/13/16. Patient is to arrive at 0800 and is to have nothing by mouth after midnight prior.

## 2016-07-21 NOTE — Telephone Encounter (Signed)
Spoke with patient's wife at this time and explained that he would need to have labs for further evaluation of abdominal pain. bShe verbalized understanding.  As soon as labs are received a decision of the next step can be made.

## 2016-07-21 NOTE — Telephone Encounter (Signed)
Reviewed lab results with Dr. Aleen Campi at this time. All results are normal. If patient is continuing to have pain he would like patient placed back on Augmentin 875mg  - 1 tab BID x 10 days #20 and then to follow-up with Dr. Tonita Cong as scheduled. If pain becomes severe or he develops worsening symptoms, he will need to be seen in ED for evaluation with CT Scan.  Spoke with patient's wife at this time. All results reviewed.   Medication called to preferred pharmacy if patient's pain persists he was informed to pick up and notify the office if he begins taking.

## 2016-07-22 NOTE — Telephone Encounter (Signed)
Spoke with patient's wife at this time and all instructions given.  Will follow-up with patient after T-tube cholangiogram in office to discuss surgery.

## 2016-07-23 ENCOUNTER — Inpatient Hospital Stay: Payer: Medicare Other | Attending: Family Medicine

## 2016-07-28 ENCOUNTER — Ambulatory Visit: Payer: Self-pay | Admitting: General Surgery

## 2016-07-28 ENCOUNTER — Encounter: Payer: Self-pay | Admitting: General Surgery

## 2016-07-28 ENCOUNTER — Ambulatory Visit (INDEPENDENT_AMBULATORY_CARE_PROVIDER_SITE_OTHER): Payer: Medicare Other | Admitting: General Surgery

## 2016-07-28 VITALS — BP 152/97 | HR 80 | Temp 98.0°F | Ht 69.0 in

## 2016-07-28 DIAGNOSIS — K819 Cholecystitis, unspecified: Secondary | ICD-10-CM | POA: Diagnosis not present

## 2016-07-28 NOTE — Progress Notes (Signed)
Outpatient Surgical Follow Up  07/28/2016  Demonte Wright [facility] is an 66 y.o. male.   Chief Complaint  Patient presents with  . Follow-up    Cholecystitis with Cholecystostomy Drain currently in place    HPI: 66 year old male returns to clinic for follow-up care of cholecystostomy drain. Patient reports feeling better than he has before. He's been seen numerous times for this. Denies fevers, chills, nausea, vomiting, chest pain, shortness breath. He has his chronic intermittent constipation issues secondary to his MS. The drain has been in place and draining between 10 and 30 mL per day. 10 mL were drained today. Continues to drain a bilious output.  Past Medical History:  Diagnosis Date  . Depression   . Diabetes mellitus without complication (HCC)   . GERD (gastroesophageal reflux disease)   . Hypertension   . MS (multiple sclerosis) (HCC) 10/02/2015  . Neuromuscular disorder [facility])     Past Surgical History:  Procedure Laterality Date  . HERNIA REPAIR  2014   [facility]- Dr. Egbert Garibaldi  . IR GENERIC HISTORICAL  07/02/2016   IR PERC CHOLECYSTOSTOMY 07/02/2016 Richarda Overlie, MD [facility]-INTERV RAD    Family History  Problem Relation Age of Onset  . Ulcerative colitis Mother   . Diabetes Mother   . Diabetes Sister   . Colon polyps Sister     Social History:  reports that he has quit smoking. He has a 50.00 pack-year smoking history. He quit smokeless tobacco use about 37 years ago. He reports that he does not drink alcohol or use drugs.  Allergies:  Allergies  Allergen Reactions  . Oxycodone-Acetaminophen Itching    Medications reviewed.    ROS A multipoint review of systems was completed. All pertinent positives and negatives are documented within the history of present illness the remainder are negative.   BP (!) 152/97   Pulse 80   Temp 98 F (36.7 C) (Oral)   Ht 5\' 9"  (1.753 m)   Physical Exam Gen.: No acute distress, wheelchair bound Chest: Clear to  auscultation Heart: Regular rhythm Abdomen: Soft, appropriately tender to palpation at the drain site, nondistended. Cholecystostomy drain in place draining a bilious output.    No results found for this or any previous visit (from the past 48 hour(s)). No results found.  Assessment/Plan:  1. Cholecystitis 66 year old male with multiple medical problems with a cholecystostomy tube currently in place. Recent labs reviewed in detail today. Plan for cholangiogram on the 26th of this month. He'll follow up with Dr. Orvis Brill after that. Counseled as to the signs and symptoms of worsening infection and to report to clinic immediately should they occur. Otherwise he'll follow-up with his scheduled follow-up next month.  A total of 15 minutes was used for this encounter with greater than 50% of it used for counseling and coordination of care.   Ricarda Frame, MD FACS General Surgeon  07/28/2016,12:10 PM

## 2016-07-30 ENCOUNTER — Inpatient Hospital Stay: Payer: Medicare Other

## 2016-08-03 ENCOUNTER — Telehealth: Payer: Self-pay | Admitting: Pharmacist

## 2016-08-03 NOTE — Telephone Encounter (Signed)
Patient has not seen Dr. Elwyn Reach for follow-up appointments for Tysabri. MD will not authorize any further infusions. Left message for patient.

## 2016-08-04 ENCOUNTER — Ambulatory Visit: Payer: Medicare Other

## 2016-08-06 ENCOUNTER — Inpatient Hospital Stay: Payer: Medicare Other

## 2016-08-13 ENCOUNTER — Inpatient Hospital Stay: Payer: Medicare Other

## 2016-08-13 ENCOUNTER — Ambulatory Visit
Admission: RE | Admit: 2016-08-13 | Discharge: 2016-08-13 | Disposition: A | Discharge status: Home / Self Care | Payer: Medicare Other | Source: Ambulatory Visit | Attending: Surgery | Admitting: Surgery

## 2016-08-13 DIAGNOSIS — K810 Acute cholecystitis: Secondary | ICD-10-CM | POA: Diagnosis present

## 2016-08-13 DIAGNOSIS — Z934 Other artificial openings of gastrointestinal tract status: Secondary | ICD-10-CM | POA: Insufficient documentation

## 2016-08-13 DIAGNOSIS — Z9689 Presence of other specified functional implants: Secondary | ICD-10-CM | POA: Diagnosis not present

## 2016-08-13 DIAGNOSIS — K8020 Calculus of gallbladder without cholecystitis without obstruction: Secondary | ICD-10-CM | POA: Insufficient documentation

## 2016-08-13 MED ORDER — IOPAMIDOL (ISOVUE-300) INJECTION 61%: 50.0000 mL | Freq: Once | INTRAVENOUS | Status: DC | PRN

## 2016-08-19 ENCOUNTER — Telehealth: Payer: Self-pay

## 2016-08-19 ENCOUNTER — Encounter: Payer: Self-pay | Admitting: General Surgery

## 2016-08-19 ENCOUNTER — Ambulatory Visit (INDEPENDENT_AMBULATORY_CARE_PROVIDER_SITE_OTHER): Payer: Medicare Other | Admitting: General Surgery

## 2016-08-19 VITALS — BP 160/77 | HR 76 | Temp 99.1°F | Ht 69.0 in | Wt 200.0 lb

## 2016-08-19 DIAGNOSIS — K8000 Calculus of gallbladder with acute cholecystitis without obstruction: Secondary | ICD-10-CM | POA: Diagnosis not present

## 2016-08-19 NOTE — Telephone Encounter (Signed)
Spoke with Polly (RN) at [facility] whom I explained that patient has an appointment today with Dr. Elwyn Reach and I need to make sure that clearance form makes it on the chart today prior to appointment. Patient's appointment is at 3pm.  She states that this will need to be faxed to [facility] (Dr. Jonah Blue receptionist) at [phone] and her phone number is [phone]. Called made to [facility]. No answer. Left voicemail that I am faxing Clearance Form at this time and would like for this to be filled out and faxed back to our office and to call with any questions or concerns.

## 2016-08-19 NOTE — Addendum Note (Signed)
Addended by: Arty Baumgartner on: 08/19/2016 02:09 PM   Modules accepted: Orders, SmartSet

## 2016-08-19 NOTE — Patient Instructions (Addendum)
You have requested to have your gallbladder removed. We will arrange for this to be done on 09/02/16 at [facility] with Dr. Tonita Cong.  You will need to HOLD your aspirin 5 days prior to your surgery. Your last dose will be on 08/27/16. You will hold this medication from 08/28/16 until after surgery.  We will have you see your Neurologist and send clearance forms to Dr. Jayme Cloud for your appointment today. I will let you know, if Dr. Jayme Cloud or Anesthesia would prefer this done at a University and you have selected [facility] if this occur.   You will be able to eat anything you would like to following surgery. But, start by eating a bland diet and advance this as tolerated.  Please see the (blue)pre-care form that you have been given today. If you have any questions, please call our office.  Laparoscopic Cholecystectomy Laparoscopic cholecystectomy is surgery to remove the gallbladder. The gallbladder is located in the upper right part of the abdomen, behind the liver. It is a storage sac for bile, which is produced in the liver. Bile aids in the digestion and absorption of fats. Cholecystectomy is often done for inflammation of the gallbladder (cholecystitis). This condition is usually caused by a buildup of gallstones (cholelithiasis) in the gallbladder. Gallstones can block the flow of bile, and that can result in inflammation and pain. In severe cases, emergency surgery may be required. If emergency surgery is not required, you will have time to prepare for the procedure. Laparoscopic surgery is an alternative to open surgery. Laparoscopic surgery has a shorter recovery time. Your common bile duct may also need to be examined during the procedure. If stones are found in the common bile duct, they may be removed. LET [facility] CARE PROVIDER KNOW ABOUT:  Any allergies you have.  All medicines you are taking, including vitamins, herbs, eye drops, creams, and over-the-counter medicines.  Previous  problems you or members of your family have had with the use of anesthetics.  Any blood disorders you have.  Previous surgeries you have had.    Any medical conditions you have. RISKS AND COMPLICATIONS Generally, this is a safe procedure. However, problems may occur, including:  Infection.  Bleeding.  Allergic reactions to medicines.  Damage to other structures or organs.  A stone remaining in the common bile duct.  A bile leak from the cyst duct that is clipped when your gallbladder is removed.  The need to convert to open surgery, which requires a larger incision in the abdomen. This may be necessary if your surgeon thinks that it is not safe to continue with a laparoscopic procedure. BEFORE THE PROCEDURE  Ask your health care provider about:  Changing or stopping your regular medicines. This is especially important if you are taking diabetes medicines or blood thinners.  Taking medicines such as aspirin and ibuprofen. These medicines can thin your blood. Do not take these medicines before your procedure if your health care provider instructs you not to.  Follow instructions from your health care provider about eating or drinking restrictions.  Let your health care provider know if you develop a cold or an infection before surgery.  Plan to have someone take you home after the procedure.  Ask your health care provider how your surgical site will be marked or identified.  You may be given antibiotic medicine to help prevent infection. PROCEDURE  To reduce your risk of infection:  Your health care team will wash or sanitize their hands.  Your skin will be washed with soap.  An IV tube may be inserted into one of your veins.  You will be given a medicine to make you fall asleep (general anesthetic).  A breathing tube will be placed in your mouth.  The surgeon will make several small cuts (incisions) in your abdomen.  A thin, lighted tube (laparoscope) that has  a tiny camera on the end will be inserted through one of the small incisions. The camera on the laparoscope will send a picture to a TV screen (monitor) in the operating room. This will give the surgeon a good view inside your abdomen.  A gas will be pumped into your abdomen. This will expand your abdomen to give the surgeon more room to perform the surgery.  Other tools that are needed for the procedure will be inserted through the other incisions. The gallbladder will be removed through one of the incisions.  After your gallbladder has been removed, the incisions will be closed with stitches (sutures), staples, or skin glue.  Your incisions may be covered with a bandage (dressing). The procedure may vary among health care providers and hospitals. AFTER THE PROCEDURE  Your blood pressure, heart rate, breathing rate, and blood oxygen level will be monitored often until the medicines you were given have worn off.  You will be given medicines as needed to control your pain.   This information is not intended to replace advice given to you by your health care provider. Make sure you discuss any questions you have with your health care provider.   Document Released: 07/05/2005 Document Revised: 03/26/2015 Document Reviewed: 02/14/2013 Elsevier Interactive Patient Education 2016 Elsevier Inc.   Low-Fat Diet for Pancreatitis or Gallbladder Conditions A low-fat diet can be helpful if you have pancreatitis or a gallbladder condition. With these conditions, your pancreas and gallbladder have trouble digesting fats. A healthy eating plan with less fat will help rest your pancreas and gallbladder and reduce your symptoms. WHAT DO I NEED TO KNOW ABOUT THIS DIET?  Eat a low-fat diet.  Reduce your fat intake to less than 20-30% of your total daily calories. This is less than 50-60 g of fat per day.  Remember that you need some fat in your diet. Ask your dietician what your daily goal should  be.  Choose nonfat and low-fat healthy foods. Look for the words "nonfat," "low fat," or "fat free."  As a guide, look on the label and choose foods with less than 3 g of fat per serving. Eat only one serving.  Avoid alcohol.  Do not smoke. If you need help quitting, talk with your health care provider.  Eat small frequent meals instead of three large heavy meals. WHAT FOODS CAN I EAT? Grains Include healthy grains and starches such as potatoes, wheat bread, fiber-rich cereal, and brown rice. Choose whole grain options whenever possible. In adults, whole grains should account for 45-65% of your daily calories.  Fruits and Vegetables Eat plenty of fruits and vegetables. Fresh fruits and vegetables add fiber to your diet. Meats and Other Protein Sources Eat lean meat such as chicken and pork. Trim any fat off of meat before cooking it. Eggs, fish, and beans are other sources of protein. In adults, these foods should account for 10-35% of your daily calories. Dairy Choose low-fat milk and dairy options. Dairy includes fat and protein, as well as calcium.  Fats and Oils Limit high-fat foods such as fried foods, sweets, baked goods, sugary drinks.  Other Creamy sauces and condiments, such as mayonnaise, can add extra fat. Think about whether or not you need to use them, or use smaller amounts or low fat options. WHAT FOODS ARE NOT RECOMMENDED?  High fat foods, such as:  [employer].  Ice cream.  [place] toast.  Sweet rolls.  Pizza.  Cheese bread.  Foods covered with batter, butter, creamy sauces, or cheese.  Fried foods.  Sugary drinks and desserts.  Foods that cause gas or bloating   This information is not intended to replace advice given to you by your health care provider. Make sure you discuss any questions you have with your health care provider.   Document Released: 07/10/2013 Document Reviewed: 07/10/2013 Elsevier Interactive Patient Education [employer].

## 2016-08-19 NOTE — Progress Notes (Signed)
Outpatient Surgical Follow Up  08/19/2016  Jesus Sanchez [facility] is an 66 y.o. male.   Chief Complaint  Patient presents with  . Follow-up    Acute Cholecystitis (Cholecystostomy Drain in Place)    HPI: 66 year old male with multiple sclerosis and a history of cholecystitis with a cholecystostomy tube in place returns to clinic to discuss cholangiogram results as well as to discuss surgery to remove the tube and gallbladder. Patient reports she is having intermittent pain and medial to the tube going into his abdomen and a chronic nausea. He has been able to eat and is having his usual bowel function. He denies any vomiting, chest pain, shortness of breath. Patient continues to report that his abdominal pain is worsened with eating greasy foods but he is continued medial to eat them. He continues to have his chronic constipation having 1 or 2 bowel movements per week which she has been seen for before in association with an anal fissure.  Past Medical History:  Diagnosis Date  . Depression   . Diabetes mellitus without complication (HCC)   . GERD (gastroesophageal reflux disease)   . Hypertension   . MS (multiple sclerosis) (HCC) 10/02/2015  . Neuromuscular disorder [facility])     Past Surgical History:  Procedure Laterality Date  . HERNIA REPAIR  2014   [facility]- Dr. Egbert Garibaldi  . IR GENERIC HISTORICAL  07/02/2016   IR PERC CHOLECYSTOSTOMY 07/02/2016 Richarda Overlie, MD [facility]-INTERV RAD    Family History  Problem Relation Age of Onset  . Ulcerative colitis Mother   . Diabetes Mother   . Diabetes Sister   . Colon polyps Sister     Social History:  reports that he has quit smoking. He has a 50.00 pack-year smoking history. He quit smokeless tobacco use about 37 years ago. He reports that he does not drink alcohol or use drugs.  Allergies:  Allergies  Allergen Reactions  . Oxycodone-Acetaminophen Itching    Medications reviewed.    ROS A multipoint review of systems was completed. All  pertinent positives and negatives are documented in the history of present illness remainder negative.   BP (!) 160/77   Pulse 76   Temp 99.1 F (37.3 C) (Oral)   Ht 5\' 9"  (1.753 m)   Wt 90.7 kg (200 lb)   BMI 29.53 kg/m   Physical Exam Gen.: No acute distress, wheelchair bound Neck: Supple and nontender Chest: Clear to auscultation Heart: Regular rhythm Abdomen: Soft, minimally tender by the cholecystostomy tube that is present and functional in the right upper quadrant, nondistended Extremities: No evidence of pedal edema Skin: No pathologic ulcers or lesions Neuro: Lower extremity is are insensate Psych: Alert, oriented, appropriate    No results found for this or any previous visit (from the past 48 hour(s)). No results found.  Assessment/Plan:  1. Acute cholecystitis due to biliary calculus 66 year old male with multiple sclerosis and a cholecystostomy tube in place for treatment of acute cholecystitis. Discussed the surgery of a laparoscopic cholecystectomy in detail with the patient.  We discussed the risks and benefits of a laparoscopic cholecystectomy and possible cholangiogram including, but not limited to bleeding, infection, injury to surrounding structures such as the intestine or liver, bile leak, retained gallstones, need to convert to an open procedure, prolonged diarrhea, blood clots such as  DVT, common bile duct injury, anesthesia risks, and possible need for additional procedures.  The likelihood of improvement in symptoms and return to the patient's normal status is good. We discussed the  typical post-operative recovery course. Given the patient's multiple sclerosis informed and they would be a higher risk from the anesthesia as well as at a higher risk of DVT blood clots. Patient has follow-up with his neurologist already scheduled and will be seeing them for preoperative clearance. He will then need to be seen by anesthesia for preoperative clearance prior to the  day of surgery. Discussed there is a possibility that they would recommend him receiving his surgical care at a tertiary care center. However, should he receive clearance from both neurology and anesthesia we'll plan for laparoscopic cholecystectomy in approximately 2 weeks.  A total of 40 minutes was spent with this encounter with greater than 50% of that being used for counseling and coordination of care    Ricarda Frame, MD [facility] General Surgeon  08/19/2016,1:03 PM

## 2016-08-20 ENCOUNTER — Telehealth: Payer: Self-pay | Admitting: General Surgery

## 2016-08-20 ENCOUNTER — Inpatient Hospital Stay: Payer: Medicare Other

## 2016-08-20 NOTE — Telephone Encounter (Signed)
Pt advised of pre op date/time and sx date. Sx: 09/02/16 with Dr Devoria Albe cholecystectomy.  Pre op: 08/24/16 @ 1:00pm--office-will see anesthesiologist.   Patient made aware to call [phone], between 1-3:00pm the day before surgery, to find out what time to arrive.   Patient was advised of appointment prior to surgery, to see Dr Tonita Cong on 08/30/16 @ 10:30am.  Patient understands to stop Asprin 5 days prior to appointment.

## 2016-08-20 NOTE — Telephone Encounter (Signed)
Medical Clearance is obtained at this time from Dr. Jayme Cloud and will be scanned under Media.

## 2016-08-24 ENCOUNTER — Encounter
Admission: RE | Admit: 2016-08-24 | Discharge: 2016-08-24 | Disposition: A | Discharge status: Home / Self Care | Payer: Medicare Other | Source: Ambulatory Visit | Attending: General Surgery | Admitting: General Surgery

## 2016-08-24 DIAGNOSIS — G35 Multiple sclerosis: Secondary | ICD-10-CM | POA: Insufficient documentation

## 2016-08-24 DIAGNOSIS — Z01812 Encounter for preprocedural laboratory examination: Secondary | ICD-10-CM | POA: Diagnosis present

## 2016-08-24 HISTORY — DX: Other complications of anesthesia, initial encounter: T88.59XA

## 2016-08-24 HISTORY — DX: Atherosclerotic heart disease of native coronary artery without angina pectoris: I25.10

## 2016-08-24 HISTORY — DX: Adverse effect of unspecified anesthetic, initial encounter: T41.45XA

## 2016-08-24 HISTORY — DX: Sleep apnea, unspecified: G47.30

## 2016-08-24 LAB — URINALYSIS, ROUTINE W REFLEX MICROSCOPIC
BILIRUBIN URINE: NEGATIVE
GLUCOSE, UA: NEGATIVE mg/dL
KETONES UR: NEGATIVE mg/dL
NITRITE: POSITIVE — AB
PH: 7 (ref 5.0–8.0)
Protein, ur: 30 mg/dL — AB
Specific Gravity, Urine: 1.01 (ref 1.005–1.030)

## 2016-08-24 LAB — COMPREHENSIVE METABOLIC PANEL
ALT: 27 U/L (ref 17–63)
AST: 24 U/L (ref 15–41)
Albumin: 3.9 g/dL (ref 3.5–5.0)
Alkaline Phosphatase: 57 U/L (ref 38–126)
Anion gap: 9 (ref 5–15)
BUN: 8 mg/dL (ref 6–20)
CHLORIDE: 94 mmol/L — AB (ref 101–111)
CO2: 34 mmol/L — ABNORMAL HIGH (ref 22–32)
Calcium: 9.1 mg/dL (ref 8.9–10.3)
Creatinine, Ser: 0.63 mg/dL (ref 0.61–1.24)
GFR calc Af Amer: >60 mL/min (ref >60)
Glucose, Bld: 181 mg/dL — ABNORMAL HIGH (ref 65–99)
POTASSIUM: 3.5 mmol/L (ref 3.5–5.1)
Sodium: 137 mmol/L (ref 135–145)
Total Bilirubin: 0.7 mg/dL (ref 0.3–1.2)
Total Protein: 7.5 g/dL (ref 6.5–8.1)

## 2016-08-24 LAB — CBC WITH DIFFERENTIAL/PLATELET
BASOS ABS: 0 10*3/uL (ref 0–0.1)
BASOS PCT: 1 %
EOS ABS: 0.9 10*3/uL — AB (ref 0–0.7)
EOS PCT: 9 %
HCT: 43.9 % (ref 40.0–52.0)
Hemoglobin: 15.2 g/dL (ref 13.0–18.0)
Lymphocytes Relative: 27 %
Lymphs Abs: 2.6 10*3/uL (ref 1.0–3.6)
MCH: 32.2 pg (ref 26.0–34.0)
MCHC: 34.7 g/dL (ref 32.0–36.0)
MCV: 92.8 fL (ref 80.0–100.0)
MONO ABS: 0.7 10*3/uL (ref 0.2–1.0)
Monocytes Relative: 7 %
Neutro Abs: 5.4 10*3/uL (ref 1.4–6.5)
Neutrophils Relative %: 56 %
PLATELETS: 169 10*3/uL (ref 150–440)
RBC: 4.73 MIL/uL (ref 4.40–5.90)
RDW: 13.8 % (ref 11.5–14.5)
WBC: 9.6 10*3/uL (ref 3.8–10.6)

## 2016-08-24 LAB — SURGICAL PCR SCREEN
MRSA, PCR: NEGATIVE
Staphylococcus aureus: NEGATIVE

## 2016-08-24 NOTE — Consult Note (Signed)
Jesus Sanchez was seen in PAT. Discussed upcoming anesthesia care. Pt understands possible exacerbation of MS symptoms following GA. Questions and concerns were addressed at this time.

## 2016-08-24 NOTE — Patient Instructions (Signed)
  Your procedure is scheduled on:09/02/16 Report to Day Surgery.[facility] SECOND FLOOR To find out your arrival time please call [phone] between 1PM - 3PM on 09/01/16  Remember: Instructions that are not followed completely may result in serious medical risk, up to and including death, or upon the discretion of your surgeon and anesthesiologist your surgery may need to be rescheduled.    _X___ 1. Do not eat food or drink liquids after midnight. No gum chewing or hard candies.     ____ 2. No Alcohol for 24 hours before or after surgery.   ____ 3. Do Not Smoke For 24 Hours Prior to Your Surgery.   ____ 4. Bring all medications with you on the day of surgery if instructed.    _X___ 5. Notify your doctor if there is any change in your medical condition     (cold, fever, infections).       Do not wear jewelry, make-up, hairpins, clips or nail polish.  Do not wear lotions, powders, or perfumes. You may wear deodorant.  Do not shave 48 hours prior to surgery. Men may shave face and neck.  Do not bring valuables to the hospital.    [facility] is not responsible for any belongings or valuables.               Contacts, dentures or bridgework may not be worn into surgery.  Leave your suitcase in the car. After surgery it may be brought to your room.  For patients admitted to the hospital, discharge time is determined by your                treatment team.   Patients discharged the day of surgery will not be allowed to drive home.   Please read over the following fact sheets that you were given:   MRSA Information   _X_ Take these medicines the morning of surgery with A SIP OF WATER:    1. GABAPENTIN  2. CELEXA  3. PANTOPRAZOLE  4.  5.  6.  ____ Fleet Enema (as directed)   X____ Use CHG Soap as directed  ____ Use inhalers on the day of surgery  _X___ Stop metformin 2 days prior to surgery    ____ Take 1/2 of usual insulin dose the night before surgery and none on the  morning of surgery.   _X___ Stop Coumadin/Plavix/aspirin on   STOP ASPIRIN 5 DAYS PREOP _X___ Stop Anti-inflammatories on   STOP IBUPROFEN UNTIL AFTER SURGERY  ____ Stop supplements until after surgery.    ____ Bring C-Pap to the hospital.

## 2016-08-25 NOTE — Pre-Procedure Instructions (Signed)
[facility] WITH [facility] CALLED AND FAXED TO DR Tonita Cong

## 2016-08-26 ENCOUNTER — Telehealth: Payer: Self-pay | Admitting: General Surgery

## 2016-08-26 ENCOUNTER — Ambulatory Visit: Payer: Self-pay | Admitting: Surgery

## 2016-08-26 MED ORDER — SULFAMETHOXAZOLE-TRIMETHOPRIM 800-160 MG PO TABS: 1.0000 | ORAL_TABLET | Freq: Two times a day (BID) | ORAL | 0 refills | Status: DC

## 2016-08-26 NOTE — Telephone Encounter (Signed)
Patient's wife has called in regards to drainage from cholecystostomy tube in the amount of over 20cc. Patient's wife is concerned with the amount of drainage. Please call patient with any advice. She feels that this is more than it has been since this tube has been placed. She wants to make sure this amount is ok.

## 2016-08-26 NOTE — Telephone Encounter (Signed)
Received positive urinalysis pre-op for patient. Discussed with Dr. Tonita Cong. Patient has been placed on Bactrim DS BID x 10 days. Medication has been sent to pharmacy.  Also would like to speak with patient's wife in regards to previous phone call today.  Call made to patient at this time. No answer. Left voicemail for return phone call.

## 2016-08-27 ENCOUNTER — Inpatient Hospital Stay: Payer: Medicare Other | Attending: Family Medicine

## 2016-08-27 LAB — URINE CULTURE: Culture: >=100000 — AB

## 2016-08-27 MED ORDER — SODIUM CHLORIDE 0.9 % IV SOLN
300.0000 mg | Freq: Once | INTRAVENOUS | Status: DC
  Filled 2016-08-27: qty 15

## 2016-08-27 MED ORDER — ACETAMINOPHEN 500 MG PO TABS: 1000.0000 mg | ORAL_TABLET | Freq: Once | ORAL | Status: DC

## 2016-08-27 MED ORDER — SODIUM CHLORIDE 0.9 % IV SOLN
Freq: Once | INTRAVENOUS | Status: DC
  Filled 2016-08-27: qty 1000

## 2016-08-27 NOTE — Telephone Encounter (Signed)
Spoke with Equities trader at [employer]. States that Culture is sensitive to Bactrim.   Patient was placed on Bactrim yesterday.   Call made to patient. Spoke with Britta Mccreedy (wife) and explained that patient needs to begin antibiotics if he has not already and take all medication. We will follow-up with patient on Monday to prepare for next week's surgery.  Encouraged to call with any questions.

## 2016-08-28 NOTE — Pre-Procedure Instructions (Signed)
Urine culture results sent to Dr. Tonita Cong for review.  Questioned if wanted any treatment?

## 2016-08-30 ENCOUNTER — Encounter: Payer: Self-pay | Admitting: General Surgery

## 2016-08-30 ENCOUNTER — Ambulatory Visit (INDEPENDENT_AMBULATORY_CARE_PROVIDER_SITE_OTHER): Payer: Medicare Other | Admitting: General Surgery

## 2016-08-30 VITALS — BP 137/88 | HR 97 | Temp 97.0°F | Ht >= 80 in | Wt 98.2 lb

## 2016-08-30 DIAGNOSIS — Z434 Encounter for attention to other artificial openings of digestive tract: Secondary | ICD-10-CM | POA: Diagnosis not present

## 2016-08-30 NOTE — Patient Instructions (Signed)
Jesus Sanchez with you surgery on 09/02/16. Please call our office if you have any questions or concerns. Please remember to call the number listed on your blue surgery sheet the day before your surgery between 1-3 pm.

## 2016-08-30 NOTE — Progress Notes (Signed)
Outpatient Surgical Follow Up  08/30/2016  Jesus Sanchez is an 66 y.o. male.   Chief Complaint  Patient presents with  . Follow-up    Laparoscopic Cholecystectomy 09/02/16    HPI: 66 year old male who is well known to the surgery service returns today to discuss preoperative workup for his laparoscopic cholecystectomy that is scheduled for later this week. He has a long-standing history of multiple sclerosis and a recent history of cholecystitis that was treated with a cholecystostomy tube. He continues to have intermittent nausea. However, he states the abdominal pain that was in his right upper quadrant after eating appears to have mostly resolved. He has not had any pain attacks for the last 2 weeks. He was diagnosed with urinary tract infection at his last visit and started on oral antibiotics with near immediate resolution of symptoms. He has been seen by his neurologist and obtained medical clearance from his neurologist to proceed with his cholecystectomy. Currently denies any abdominal pain. He denies any chest pain, shortness of breath. He has chronic constipation. Patient is wheelchair-bound secondary to his MS.  Past Medical History:  Diagnosis Date  . Complication of anesthesia    with sedation had to have narcan 1 month ago  . Coronary artery disease   . Depression   . Diabetes mellitus without complication (HCC)   . GERD (gastroesophageal reflux disease)   . Hypertension   . MS (multiple sclerosis) (HCC) 10/02/2015  . Neuromuscular disorder (HCC)    multiple sclerosis  . Sleep apnea    no cpap now being re evaluated    Past Surgical History:  Procedure Laterality Date  . CORONARY ANGIOPLASTY     stent 2000  . HERNIA REPAIR  2014   [facility]- Dr. Egbert Garibaldi  . IR GENERIC HISTORICAL  07/02/2016   IR PERC CHOLECYSTOSTOMY 07/02/2016 Richarda Overlie, MD [facility]-INTERV RAD    Family History  Problem Relation Age of Onset  . Ulcerative colitis Mother   . Diabetes Mother   .  Diabetes Sister   . Colon polyps Sister     Social History:  reports that he quit smoking about 38 years ago. He has a 50.00 pack-year smoking history. He quit smokeless tobacco use about 37 years ago. He reports that he does not drink alcohol or use drugs.  Allergies:  Allergies  Allergen Reactions  . Contrast Media [Iodinated Diagnostic Agents] Itching    Itching x 3 days  . Oxycodone-Acetaminophen Itching    Medications reviewed.    ROS A multipoint review of systems was completed. All pertinent positives and negatives are documented within the history of present illness and remainder are negative.   BP 137/88   Pulse 97   Temp 97 F (36.1 C) (Oral)   Ht 7\' 5"  (2.261 m)   Wt 44.5 kg (98 lb 3.2 oz)   BMI 8.72 kg/m   Physical Exam Gen.: No acute distress Neck: Supple and nontender Chest: Clear to auscultation Heart: Regular rhythm Abdomen: Soft, nontender, nondistended. Cholecystostomy tube in place in the right upper quadrant draining a bilious fluid. Extremities: No evidence of pedal edema Skin: No pathologic ulcers or lesions Neuro: Lower extremity is are insensate Psych: Alert, oriented, appropriate   No results found for this or any previous visit (from the past 48 hour(s)). No results found.  Assessment/Plan:  1. Cholecystostomy care [facility]) 66 year old male with a cholecystostomy tube in place for treatment of acute cholecystitis. Again revisited the surgical plans of a laparoscopic cholecystectomy. He has been  seen by his neurologist for his multiple sclerosis and has clearance for surgery later this week. Discussed the surgery again in detail to include the risks, benefits, alternatives of a laparoscopic cholecystectomy with possible cholangiogram. The primary risk for of bleeding, infection, injury to surrounding structures such as the intestine, liver, bile ducts. Also discussed the possibility of retained gallstones, need to convert to open procedure,  possibility of developing diarrhea, possibility of blood clots such as DVT. Discussed the possible need of possible additional procedures. Patient voiced understanding. Due to the patient's multiple sclerosis making this a higher risk procedure the plan will be for the patient to stay overnight under observation regardless of operative difficulty. All questions answered to the patient's satisfaction. Plan to proceed with surgery on February 16.  A total of 40 minutes was spent on this encounter with greater than 20 minutes of a use for counseling and coordination of care.   Ricarda Frame, MD FACS General Surgeon  08/30/2016,1:03 PM

## 2016-09-02 ENCOUNTER — Encounter
Admission: RE | Disposition: A | Discharge status: Home / Self Care | Payer: Self-pay | Source: Ambulatory Visit | Attending: General Surgery

## 2016-09-02 ENCOUNTER — Observation Stay
Admission: RE | Admit: 2016-09-02 | Discharge: 2016-09-03 | Disposition: A | Discharge status: Home / Self Care | Payer: Medicare Other | Source: Ambulatory Visit | Attending: General Surgery | Admitting: General Surgery

## 2016-09-02 ENCOUNTER — Ambulatory Visit: Payer: Medicare Other | Admitting: Anesthesiology

## 2016-09-02 ENCOUNTER — Ambulatory Visit: Payer: Medicare Other

## 2016-09-02 ENCOUNTER — Encounter: Payer: Self-pay | Admitting: *Deleted

## 2016-09-02 DIAGNOSIS — K8012 Calculus of gallbladder with acute and chronic cholecystitis without obstruction: Principal | ICD-10-CM | POA: Insufficient documentation

## 2016-09-02 DIAGNOSIS — G4730 Sleep apnea, unspecified: Secondary | ICD-10-CM | POA: Diagnosis not present

## 2016-09-02 DIAGNOSIS — I2510 Atherosclerotic heart disease of native coronary artery without angina pectoris: Secondary | ICD-10-CM | POA: Insufficient documentation

## 2016-09-02 DIAGNOSIS — K811 Chronic cholecystitis: Secondary | ICD-10-CM | POA: Diagnosis not present

## 2016-09-02 DIAGNOSIS — K8010 Calculus of gallbladder with chronic cholecystitis without obstruction: Secondary | ICD-10-CM | POA: Diagnosis present

## 2016-09-02 DIAGNOSIS — I10 Essential (primary) hypertension: Secondary | ICD-10-CM | POA: Insufficient documentation

## 2016-09-02 DIAGNOSIS — E119 Type 2 diabetes mellitus without complications: Secondary | ICD-10-CM | POA: Diagnosis not present

## 2016-09-02 DIAGNOSIS — Z91041 Radiographic dye allergy status: Secondary | ICD-10-CM | POA: Insufficient documentation

## 2016-09-02 DIAGNOSIS — G35 Multiple sclerosis: Secondary | ICD-10-CM | POA: Insufficient documentation

## 2016-09-02 DIAGNOSIS — Z955 Presence of coronary angioplasty implant and graft: Secondary | ICD-10-CM | POA: Diagnosis not present

## 2016-09-02 DIAGNOSIS — Z87891 Personal history of nicotine dependence: Secondary | ICD-10-CM | POA: Diagnosis not present

## 2016-09-02 DIAGNOSIS — Z885 Allergy status to narcotic agent status: Secondary | ICD-10-CM | POA: Insufficient documentation

## 2016-09-02 DIAGNOSIS — F329 Major depressive disorder, single episode, unspecified: Secondary | ICD-10-CM | POA: Diagnosis not present

## 2016-09-02 DIAGNOSIS — K219 Gastro-esophageal reflux disease without esophagitis: Secondary | ICD-10-CM | POA: Diagnosis not present

## 2016-09-02 DIAGNOSIS — K819 Cholecystitis, unspecified: Secondary | ICD-10-CM

## 2016-09-02 HISTORY — PX: CHOLECYSTECTOMY: SHX55

## 2016-09-02 LAB — GLUCOSE, CAPILLARY
GLUCOSE-CAPILLARY: 161 mg/dL — AB (ref 65–99)
GLUCOSE-CAPILLARY: 221 mg/dL — AB (ref 65–99)
Glucose-Capillary: 169 mg/dL — ABNORMAL HIGH (ref 65–99)

## 2016-09-02 LAB — CBC
HCT: 42.6 % (ref 40.0–52.0)
Hemoglobin: 14.3 g/dL (ref 13.0–18.0)
MCH: 31.8 pg (ref 26.0–34.0)
MCHC: 33.7 g/dL (ref 32.0–36.0)
MCV: 94.5 fL (ref 80.0–100.0)
Platelets: 173 10*3/uL (ref 150–440)
RBC: 4.5 MIL/uL (ref 4.40–5.90)
RDW: 14.3 % (ref 11.5–14.5)
WBC: 12.2 10*3/uL — ABNORMAL HIGH (ref 3.8–10.6)

## 2016-09-02 LAB — CREATININE, SERUM
CREATININE: 0.89 mg/dL (ref 0.61–1.24)
GFR calc Af Amer: >60 mL/min (ref >60)

## 2016-09-02 SURGERY — LAPAROSCOPIC CHOLECYSTECTOMY WITH INTRAOPERATIVE CHOLANGIOGRAM
Anesthesia: General | Wound class: Clean Contaminated

## 2016-09-02 MED ORDER — LOSARTAN POTASSIUM 50 MG PO TABS
100.0000 mg | ORAL_TABLET | Freq: Every day | ORAL | Status: DC
  Administered 2016-09-03: 100 mg via ORAL
  Filled 2016-09-02: qty 2

## 2016-09-02 MED ORDER — ONDANSETRON HCL 4 MG/2ML IJ SOLN: 4.0000 mg | Freq: Once | INTRAMUSCULAR | Status: DC | PRN

## 2016-09-02 MED ORDER — DIPHENHYDRAMINE HCL 12.5 MG/5ML PO ELIX: 12.5000 mg | ORAL_SOLUTION | Freq: Four times a day (QID) | ORAL | Status: DC | PRN

## 2016-09-02 MED ORDER — ACETAMINOPHEN 10 MG/ML IV SOLN
INTRAVENOUS | Status: DC | PRN
  Administered 2016-09-02: 1000 mg via INTRAVENOUS

## 2016-09-02 MED ORDER — GABAPENTIN 300 MG PO CAPS
600.0000 mg | ORAL_CAPSULE | Freq: Every morning | ORAL | Status: DC
  Administered 2016-09-03: 600 mg via ORAL
  Filled 2016-09-02: qty 2

## 2016-09-02 MED ORDER — SODIUM CHLORIDE 0.9 % IJ SOLN
INTRAMUSCULAR | Status: AC
  Filled 2016-09-02: qty 10

## 2016-09-02 MED ORDER — DIPHENHYDRAMINE HCL 50 MG/ML IJ SOLN: 12.5000 mg | Freq: Four times a day (QID) | INTRAMUSCULAR | Status: DC | PRN

## 2016-09-02 MED ORDER — CHLORHEXIDINE GLUCONATE CLOTH 2 % EX PADS: 6.0000 | MEDICATED_PAD | Freq: Once | CUTANEOUS | Status: DC

## 2016-09-02 MED ORDER — ATORVASTATIN CALCIUM 10 MG PO TABS
10.0000 mg | ORAL_TABLET | Freq: Every day | ORAL | Status: DC
  Administered 2016-09-02: 10 mg via ORAL
  Filled 2016-09-02: qty 1

## 2016-09-02 MED ORDER — PROPOFOL 10 MG/ML IV BOLUS
INTRAVENOUS | Status: DC | PRN
  Administered 2016-09-02: 130 mg via INTRAVENOUS

## 2016-09-02 MED ORDER — MORPHINE SULFATE (PF) 2 MG/ML IV SOLN
2.0000 mg | INTRAVENOUS | Status: DC | PRN
  Administered 2016-09-02 (×2): 2 mg via INTRAVENOUS
  Filled 2016-09-02 (×2): qty 1

## 2016-09-02 MED ORDER — FENTANYL CITRATE (PF) 100 MCG/2ML IJ SOLN
INTRAMUSCULAR | Status: DC | PRN
  Administered 2016-09-02 (×2): 50 ug via INTRAVENOUS

## 2016-09-02 MED ORDER — HYDROCHLOROTHIAZIDE 25 MG PO TABS
25.0000 mg | ORAL_TABLET | Freq: Every day | ORAL | Status: DC
  Administered 2016-09-03: 25 mg via ORAL
  Filled 2016-09-02: qty 1

## 2016-09-02 MED ORDER — SULFAMETHOXAZOLE-TRIMETHOPRIM 800-160 MG PO TABS
1.0000 | ORAL_TABLET | Freq: Two times a day (BID) | ORAL | Status: DC
  Administered 2016-09-02 – 2016-09-03 (×3): 1 via ORAL
  Filled 2016-09-02 (×3): qty 1

## 2016-09-02 MED ORDER — PROPOFOL 10 MG/ML IV BOLUS
INTRAVENOUS | Status: AC
  Filled 2016-09-02: qty 20

## 2016-09-02 MED ORDER — FENTANYL CITRATE (PF) 100 MCG/2ML IJ SOLN: 25.0000 ug | INTRAMUSCULAR | Status: DC | PRN

## 2016-09-02 MED ORDER — MIDAZOLAM HCL 2 MG/2ML IJ SOLN
INTRAMUSCULAR | Status: DC | PRN
  Administered 2016-09-02: 1 mg via INTRAVENOUS

## 2016-09-02 MED ORDER — SODIUM CHLORIDE 0.9 % IV SOLN
INTRAVENOUS | Status: DC
  Administered 2016-09-02 (×2): via INTRAVENOUS

## 2016-09-02 MED ORDER — ROCURONIUM BROMIDE 100 MG/10ML IV SOLN
INTRAVENOUS | Status: DC | PRN
  Administered 2016-09-02: 20 mg via INTRAVENOUS
  Administered 2016-09-02: 10 mg via INTRAVENOUS
  Administered 2016-09-02: 20 mg via INTRAVENOUS
  Administered 2016-09-02: 30 mg via INTRAVENOUS

## 2016-09-02 MED ORDER — CITALOPRAM HYDROBROMIDE 20 MG PO TABS
40.0000 mg | ORAL_TABLET | Freq: Every day | ORAL | Status: DC
  Administered 2016-09-03: 40 mg via ORAL
  Filled 2016-09-02: qty 2

## 2016-09-02 MED ORDER — LOSARTAN POTASSIUM-HCTZ 100-25 MG PO TABS: 1.0000 | ORAL_TABLET | Freq: Every day | ORAL | Status: DC

## 2016-09-02 MED ORDER — GABAPENTIN 300 MG PO CAPS
900.0000 mg | ORAL_CAPSULE | Freq: Every day | ORAL | Status: DC
  Administered 2016-09-02: 900 mg via ORAL
  Filled 2016-09-02: qty 3

## 2016-09-02 MED ORDER — HYDROCODONE-ACETAMINOPHEN 5-325 MG PO TABS
1.0000 | ORAL_TABLET | ORAL | Status: DC | PRN
  Administered 2016-09-03: 1 via ORAL
  Filled 2016-09-02: qty 1

## 2016-09-02 MED ORDER — HYDRALAZINE HCL 20 MG/ML IJ SOLN: 10.0000 mg | INTRAMUSCULAR | Status: DC | PRN

## 2016-09-02 MED ORDER — SUGAMMADEX SODIUM 200 MG/2ML IV SOLN
INTRAVENOUS | Status: DC | PRN
  Administered 2016-09-02: 200 mg via INTRAVENOUS

## 2016-09-02 MED ORDER — ENOXAPARIN SODIUM 40 MG/0.4ML ~~LOC~~ SOLN
40.0000 mg | SUBCUTANEOUS | Status: DC
  Administered 2016-09-03: 40 mg via SUBCUTANEOUS
  Filled 2016-09-02: qty 0.4

## 2016-09-02 MED ORDER — LIDOCAINE HCL (PF) 2 % IJ SOLN
INTRAMUSCULAR | Status: AC
  Filled 2016-09-02: qty 2

## 2016-09-02 MED ORDER — ROCURONIUM BROMIDE 50 MG/5ML IV SOLN
INTRAVENOUS | Status: AC
  Filled 2016-09-02: qty 1

## 2016-09-02 MED ORDER — ONDANSETRON HCL 4 MG/2ML IJ SOLN
INTRAMUSCULAR | Status: DC | PRN
  Administered 2016-09-02: 4 mg via INTRAVENOUS

## 2016-09-02 MED ORDER — PANTOPRAZOLE SODIUM 40 MG PO TBEC
40.0000 mg | DELAYED_RELEASE_TABLET | Freq: Two times a day (BID) | ORAL | Status: DC
  Administered 2016-09-02 – 2016-09-03 (×2): 40 mg via ORAL
  Filled 2016-09-02 (×2): qty 1

## 2016-09-02 MED ORDER — ACETAMINOPHEN 10 MG/ML IV SOLN
INTRAVENOUS | Status: AC
  Filled 2016-09-02: qty 100

## 2016-09-02 MED ORDER — OXYBUTYNIN CHLORIDE 5 MG PO TABS
5.0000 mg | ORAL_TABLET | Freq: Three times a day (TID) | ORAL | Status: DC | PRN
  Administered 2016-09-02: 5 mg via ORAL
  Filled 2016-09-02 (×2): qty 1

## 2016-09-02 MED ORDER — METFORMIN HCL 500 MG PO TABS
1000.0000 mg | ORAL_TABLET | Freq: Two times a day (BID) | ORAL | Status: DC
  Administered 2016-09-02 – 2016-09-03 (×2): 1000 mg via ORAL
  Filled 2016-09-02 (×3): qty 2

## 2016-09-02 MED ORDER — ONDANSETRON HCL 4 MG/2ML IJ SOLN: 4.0000 mg | Freq: Four times a day (QID) | INTRAMUSCULAR | Status: DC | PRN

## 2016-09-02 MED ORDER — ONDANSETRON 4 MG PO TBDP: 4.0000 mg | ORAL_TABLET | Freq: Four times a day (QID) | ORAL | Status: DC | PRN

## 2016-09-02 MED ORDER — EPHEDRINE SULFATE 50 MG/ML IJ SOLN
INTRAMUSCULAR | Status: AC
  Filled 2016-09-02: qty 1

## 2016-09-02 MED ORDER — DEXTROSE 5 % IV SOLN
1.0000 g | INTRAVENOUS | Status: AC
  Administered 2016-09-02: 1 g via INTRAVENOUS
  Filled 2016-09-02: qty 1

## 2016-09-02 MED ORDER — BUPIVACAINE HCL 0.5 % IJ SOLN
INTRAMUSCULAR | Status: DC | PRN
  Administered 2016-09-02: 30 mL via SUBCUTANEOUS

## 2016-09-02 MED ORDER — MIDAZOLAM HCL 2 MG/2ML IJ SOLN
INTRAMUSCULAR | Status: AC
  Filled 2016-09-02: qty 2

## 2016-09-02 MED ORDER — LIDOCAINE HCL (PF) 1 % IJ SOLN
INTRAMUSCULAR | Status: AC
  Filled 2016-09-02: qty 30

## 2016-09-02 MED ORDER — LIDOCAINE HCL (CARDIAC) 20 MG/ML IV SOLN
INTRAVENOUS | Status: DC | PRN
  Administered 2016-09-02: 100 mg via INTRAVENOUS

## 2016-09-02 MED ORDER — BUPIVACAINE HCL (PF) 0.5 % IJ SOLN
INTRAMUSCULAR | Status: AC
  Filled 2016-09-02: qty 30

## 2016-09-02 MED ORDER — ONDANSETRON HCL 4 MG/2ML IJ SOLN
INTRAMUSCULAR | Status: AC
  Filled 2016-09-02: qty 2

## 2016-09-02 MED ORDER — PHENYLEPHRINE HCL 10 MG/ML IJ SOLN
INTRAMUSCULAR | Status: AC
  Filled 2016-09-02: qty 1

## 2016-09-02 MED ORDER — FENTANYL CITRATE (PF) 100 MCG/2ML IJ SOLN
INTRAMUSCULAR | Status: AC
  Filled 2016-09-02: qty 2

## 2016-09-02 MED ORDER — IOTHALAMATE MEGLUMINE 60 % INJ SOLN
INTRAMUSCULAR | Status: DC | PRN
  Administered 2016-09-02: 8 mL

## 2016-09-02 MED ORDER — DEXAMETHASONE SODIUM PHOSPHATE 10 MG/ML IJ SOLN
INTRAMUSCULAR | Status: AC
  Filled 2016-09-02: qty 1

## 2016-09-02 MED ORDER — PHENYLEPHRINE HCL 10 MG/ML IJ SOLN
INTRAMUSCULAR | Status: DC | PRN
  Administered 2016-09-02: 50 ug via INTRAVENOUS
  Administered 2016-09-02 (×4): 100 ug via INTRAVENOUS
  Administered 2016-09-02: 200 ug via INTRAVENOUS
  Administered 2016-09-02: 100 ug via INTRAVENOUS

## 2016-09-02 SURGICAL SUPPLY — 47 items
ADHESIVE MASTISOL STRL (MISCELLANEOUS) ×3 IMPLANT
APPLIER CLIP ROT 10 11.4 M/L (STAPLE) ×3
BAG COUNTER SPONGE EZ (MISCELLANEOUS) IMPLANT
BLADE SURG SZ11 CARB STEEL (BLADE) ×3 IMPLANT
CANISTER SUCT 1200ML W/VALVE (MISCELLANEOUS) ×3 IMPLANT
CATH CHOLANG 76X19 KUMAR (CATHETERS) ×3 IMPLANT
CHLORAPREP W/TINT 26ML (MISCELLANEOUS) ×3 IMPLANT
CLIP APPLIE ROT 10 11.4 M/L (STAPLE) ×1 IMPLANT
CLOSURE WOUND 1/2 X4 (GAUZE/BANDAGES/DRESSINGS) ×1
CONRAY 60ML FOR OR (MISCELLANEOUS) ×3 IMPLANT
COUNTER SPONGE BAG EZ (MISCELLANEOUS)
DRAIN CHANNEL JP 19F (MISCELLANEOUS) ×3 IMPLANT
DRAPE SHEET LG 3/4 BI-LAMINATE (DRAPES) ×3 IMPLANT
DRESSING TELFA 4X3 1S ST N-ADH (GAUZE/BANDAGES/DRESSINGS) ×3 IMPLANT
DRSG TEGADERM 2-3/8X2-3/4 SM (GAUZE/BANDAGES/DRESSINGS) ×12 IMPLANT
ELECT REM PT RETURN 9FT ADLT (ELECTROSURGICAL) ×3
ELECTRODE REM PT RTRN 9FT ADLT (ELECTROSURGICAL) ×1 IMPLANT
GLOVE BIO SURGEON STRL SZ7.5 (GLOVE) ×3 IMPLANT
GLOVE INDICATOR 8.0 STRL GRN (GLOVE) ×3 IMPLANT
GOWN STRL REUS W/ TWL LRG LVL3 (GOWN DISPOSABLE) ×3 IMPLANT
GOWN STRL REUS W/TWL LRG LVL3 (GOWN DISPOSABLE) ×6
GRASPER SUT TROCAR 14GX15 (MISCELLANEOUS) ×3 IMPLANT
IRRIGATION STRYKERFLOW (MISCELLANEOUS) ×1 IMPLANT
IRRIGATOR STRYKERFLOW (MISCELLANEOUS) ×3
IV NS 1000ML (IV SOLUTION)
IV NS 1000ML BAXH (IV SOLUTION) IMPLANT
L-HOOK LAP DISP 36CM (ELECTROSURGICAL) ×3
LABEL OR SOLS (LABEL) ×3 IMPLANT
LHOOK LAP DISP 36CM (ELECTROSURGICAL) ×1 IMPLANT
NEEDLE HYPO 25X1 1.5 SAFETY (NEEDLE) ×3 IMPLANT
NEEDLE VERESS 14GA 120MM (NEEDLE) ×3 IMPLANT
NS IRRIG 500ML POUR BTL (IV SOLUTION) ×3 IMPLANT
PACK LAP CHOLECYSTECTOMY (MISCELLANEOUS) ×3 IMPLANT
PENCIL ELECTRO HAND CTR (MISCELLANEOUS) ×3 IMPLANT
POUCH ENDO CATCH 10MM SPEC (MISCELLANEOUS) ×3 IMPLANT
SCISSORS METZENBAUM CVD 33 (INSTRUMENTS) ×3 IMPLANT
SLEEVE ENDOPATH XCEL 5M (ENDOMECHANICALS) ×6 IMPLANT
SPONGE DRAIN TRACH 4X4 STRL 2S (GAUZE/BANDAGES/DRESSINGS) ×3 IMPLANT
STRIP CLOSURE SKIN 1/2X4 (GAUZE/BANDAGES/DRESSINGS) ×2 IMPLANT
SUCT RESERVOIR 100CC (MISCELLANEOUS) ×3 IMPLANT
SUT ETHILON 3-0 (SUTURE) ×3 IMPLANT
SUT MNCRL 4-0 (SUTURE) ×2
SUT MNCRL 4-0 27XMFL (SUTURE) ×1
SUTURE MNCRL 4-0 27XMF (SUTURE) ×1 IMPLANT
TROCAR XCEL 12X100 BLDLESS (ENDOMECHANICALS) ×3 IMPLANT
TROCAR XCEL NON-BLD 5MMX100MML (ENDOMECHANICALS) ×3 IMPLANT
TUBING INSUFFLATOR HI FLOW (MISCELLANEOUS) ×3 IMPLANT

## 2016-09-02 NOTE — H&P (View-Only) (Signed)
Outpatient Surgical Follow Up  08/30/2016  Jesus Sanchez is an 66 y.o. male.   Chief Complaint  Patient presents with  . Follow-up    Laparoscopic Cholecystectomy 09/02/16    HPI: 66 year old male who is well known to the surgery service returns today to discuss preoperative workup for his laparoscopic cholecystectomy that is scheduled for later this week. He has a long-standing history of multiple sclerosis and a recent history of cholecystitis that was treated with a cholecystostomy tube. He continues to have intermittent nausea. However, he states the abdominal pain that was in his right upper quadrant after eating appears to have mostly resolved. He has not had any pain attacks for the last 2 weeks. He was diagnosed with urinary tract infection at his last visit and started on oral antibiotics with near immediate resolution of symptoms. He has been seen by his neurologist and obtained medical clearance from his neurologist to proceed with his cholecystectomy. Currently denies any abdominal pain. He denies any chest pain, shortness of breath. He has chronic constipation. Patient is wheelchair-bound secondary to his MS.  Past Medical History:  Diagnosis Date  . Complication of anesthesia    with sedation had to have narcan 1 month ago  . Coronary artery disease   . Depression   . Diabetes mellitus without complication (HCC)   . GERD (gastroesophageal reflux disease)   . Hypertension   . MS (multiple sclerosis) (HCC) 10/02/2015  . Neuromuscular disorder (HCC)    multiple sclerosis  . Sleep apnea    no cpap now being re evaluated    Past Surgical History:  Procedure Laterality Date  . CORONARY ANGIOPLASTY     stent 2000  . HERNIA REPAIR  2014   [facility]- Dr. Egbert Garibaldi  . IR GENERIC HISTORICAL  07/02/2016   IR PERC CHOLECYSTOSTOMY 07/02/2016 Richarda Overlie, MD [facility]-INTER[facility] RAD    Family History  Problem Relation Age of Onset  . Ulcerative colitis Mother   . Diabetes Mother   .  Diabetes Sister   . Colon polyps Sister     Social History:  reports that he quit smoking about 38 years ago. He has a 50.00 pack-year smoking history. He quit smokeless tobacco use about 37 years ago. He reports that he does not drink alcohol or use drugs.  Allergies:  Allergies  Allergen Reactions  . Contrast Media [Iodinated Diagnostic Agents] Itching    Itching x 3 days  . Oxycodone-Acetaminophen Itching    Medications reviewed.    ROS A multipoint review of systems was completed. All pertinent positives and negatives are documented within the history of present illness and remainder are negative.   BP 137/88   Pulse 97   Temp 97 F (36.1 C) (Oral)   Ht 7\' 5"  (2.261 m)   Wt 44.5 kg (98 lb 3.2 oz)   BMI 8.72 kg/m   Physical Exam Gen.: No acute distress Neck: Supple and nontender Chest: Clear to auscultation Heart: Regular rhythm Abdomen: Soft, nontender, nondistended. Cholecystostomy tube in place in the right upper quadrant draining a bilious fluid. Extremities: No evidence of pedal edema Skin: No pathologic ulcers or lesions Neuro: Lower extremity is are insensate Psych: Alert, oriented, appropriate   No results found for this or any previous visit (from the past 48 hour(s)). No results found.  Assessment/Plan:  1. Cholecystostomy care [facility]) 66 year old male with a cholecystostomy tube in place for treatment of acute cholecystitis. Again revisited the surgical plans of a laparoscopic cholecystectomy. He has been  seen by his neurologist for his multiple sclerosis and has clearance for surgery later this week. Discussed the surgery again in detail to include the risks, benefits, alternatives of a laparoscopic cholecystectomy with possible cholangiogram. The primary risk for of bleeding, infection, injury to surrounding structures such as the intestine, liver, bile ducts. Also discussed the possibility of retained gallstones, need to convert to open procedure,  possibility of developing diarrhea, possibility of blood clots such as DVT. Discussed the possible need of possible additional procedures. Patient voiced understanding. Due to the patient's multiple sclerosis making this a higher risk procedure the plan will be for the patient to stay overnight under observation regardless of operative difficulty. All questions answered to the patient's satisfaction. Plan to proceed with surgery on February 16.  A total of 40 minutes was spent on this encounter with greater than 20 minutes of a use for counseling and coordination of care.   Ricarda Frame, MD FACS General Surgeon  08/30/2016,1:03 PM

## 2016-09-02 NOTE — Anesthesia Post-op Follow-up Note (Cosign Needed)
Anesthesia QCDR form completed.        

## 2016-09-02 NOTE — Interval H&P Note (Signed)
History and Physical Interval Note:  09/02/2016 11:06 AM  Jesus Sanchez  has presented today for surgery, with the diagnosis of acute cholecystitis  The various methods of treatment have been discussed with the patient and family. After consideration of risks, benefits and other options for treatment, the patient has consented to  Procedure(s): LAPAROSCOPIC CHOLECYSTECTOMY (N/A) as a surgical intervention .  The patient's history has been reviewed, patient examined, no change in status, stable for surgery.  I have reviewed the patient's chart and labs.  Questions were answered to the patient's satisfaction.     Ricarda Frame

## 2016-09-02 NOTE — Anesthesia Postprocedure Evaluation (Signed)
Anesthesia Post Note  Patient: Homero Stanbro [facility]  Procedure(s) Performed: Procedure(s) (LRB): LAPAROSCOPIC CHOLECYSTECTOMY WITH CHOLANGIOGRAM (N/A)  Patient location during evaluation: PACU Anesthesia Type: General Level of consciousness: awake and alert Pain management: pain level controlled Vital Signs Assessment: post-procedure vital signs reviewed and stable Respiratory status: spontaneous breathing, nonlabored ventilation, respiratory function stable and patient connected to nasal cannula oxygen Cardiovascular status: blood pressure returned to baseline and stable Postop Assessment: no signs of nausea or vomiting Anesthetic complications: no     Last Vitals:  Vitals:   09/02/16 1530 09/02/16 1627  BP: 124/73 110/66  Pulse: 90 91  Resp: 16 16  Temp: 36.8 C 36.7 C    Last Pain:  Vitals:   09/02/16 1627  TempSrc: Oral  PainSc:                  Janny Crute S

## 2016-09-02 NOTE — Brief Op Note (Signed)
09/02/2016  2:09 PM  PATIENT:  Jesus Sanchez  66 y.o. male  PRE-OPERATIVE DIAGNOSIS:  Chronic cholecystitis  POST-OPERATIVE DIAGNOSIS:  acute on chronic cholecystitis  PROCEDURE:  Procedure(s): LAPAROSCOPIC CHOLECYSTECTOMY WITH CHOLANGIOGRAM (N/A)  SURGEON:  Surgeon(s) and Role:    * Ricarda Frame, MD - Primary  PHYSICIAN ASSISTANT:   ASSISTANTS: none   ANESTHESIA:   general  EBL:  Total I/O In: 1000 [I.V.:1000] Out: -   BLOOD ADMINISTERED:none  DRAINS: (53fr) Blake drain(s) in the perihepatic space   LOCAL MEDICATIONS USED:  MARCAINE   , XYLOCAINE  and Amount: 29 ml  SPECIMEN:  Source of Specimen:  gallbladder  DISPOSITION OF SPECIMEN:  PATHOLOGY  COUNTS:  YES  TOURNIQUET:  * No tourniquets in log *  DICTATION: .Dragon Dictation  PLAN OF CARE: Admit for overnight observation  PATIENT DISPOSITION:  PACU - hemodynamically stable.   Delay start of Pharmacological VTE agent (>24hrs) due to surgical blood loss or risk of bleeding: no

## 2016-09-02 NOTE — Transfer of Care (Signed)
Immediate Anesthesia Transfer of Care Note  Patient: Jesus Sanchez  Procedure(s) Performed: Procedure(s): LAPAROSCOPIC CHOLECYSTECTOMY WITH CHOLANGIOGRAM (N/A)  Patient Location: PACU  Anesthesia Type:General  Level of Consciousness: awake  Airway & Oxygen Therapy: Patient connected to face mask oxygen  Post-op Assessment: Post -op Vital signs reviewed and stable  Post vital signs: stable  Last Vitals:  Vitals:   09/02/16 1000 09/02/16 1426  BP: 135/77 (!) 144/84  Pulse: 67 91  Resp: 18 15  Temp: 36.1 C 36.8 C    Last Pain:  Vitals:   09/02/16 1426  TempSrc: Temporal  PainSc:          Complications: No apparent anesthesia complications

## 2016-09-02 NOTE — Op Note (Signed)
Laparoscopic Cholecystectomy  Pre-operative Diagnosis: Chronic Cholecystitis  Post-operative Diagnosis: Acute on chronic cholecystitis  Procedure: Laparoscopic cholecystectomy with intraoperative cholangiogram  Surgeon: Leonette Most T. Tonita Cong, MD FACS  Anesthesia: Gen. with endotracheal tube  Assistant: PA student  Procedure Details  The patient was seen again in the Holding Room. The benefits, complications, treatment options, and expected outcomes were discussed with the patient. The risks of bleeding, infection, recurrence of symptoms, failure to resolve symptoms, bile duct damage, bile duct leak, retained common bile duct stone, bowel injury, any of which could require further surgery and/or ERCP, stent, or papillotomy were reviewed with the patient. The likelihood of improving the patient's symptoms with return to their baseline status is good.  The patient and/or family concurred with the proposed plan, giving informed consent.  The patient was taken to Operating Room, identified as JAXIN FULFER and the procedure verified as Laparoscopic Cholecystectomy.  A Time Out was held and the above information confirmed.  Prior to the induction of general anesthesia, antibiotic prophylaxis was administered. VTE prophylaxis was in place. General endotracheal anesthesia was then administered and tolerated well. After the induction, the abdomen was prepped with Chloraprep and draped in the sterile fashion. The patient was positioned in the supine position.  Local anesthetic was injected into the skin in the right midclavicular line 2 finger breaths below the costal margin and an incision made. The Veress needle was placed. Pneumoperitoneum was then created with CO2 and tolerated well without any adverse changes in the patient's vital signs. A 5mm port was placed in the previously made incision and the abdominal cavity was explored.  Two 5-mm ports were placed with one in the supraumbilical and an  additional in the right upper quadrant and a 12 mm epigastric port was placed all under direct vision. All skin incisions  were infiltrated with a local anesthetic agent before making the incision and placing the trocars.   The patient was positioned  in reverse Trendelenburg, tilted slightly to the patient's left. The gallbladder was not immediately able to be identified secondary to extensive inflammatory adhesions between the mesentery, omentum, abdominal wall. The attachments into the abdominal wall were taken down bluntly until the percutaneous cholecystostomy drain was visualized going into the liver. Next the omental attachments were taken down using accommodation of blunt dissection and electrocautery until the gallbladder itself could be identified. Due to the extensive nature of the adhesions to the gallbladder a fifth trocar was placed in the right lower quadrant under direct visualization. AP retractor was unable be used to distract the tissue away from the gallbladder.  Once the gallbladder was identified, the fundus grasped and retracted cephalad. Adhesions were lysed bluntly. The infundibulum was extremely difficult to grasp secondary to the inflammatory adhesions making any mobility near impossible. The visualized gallbladder was freed up in a dome down technique until there was mobility of the gallbladder. Using additional electrocautery of the medial and lateral aspects of the gallbladder going into the liver were taken down. Despite this extensive dissection the transition from gallbladder the cystic duct was not able to be clearly identified. During the dome down technique the cholecystostomy tube was visualized going into the posterior wall of the gallbladder. At this point the catheter was cut outside of the abdomen and the drain was able to be removed under direct visualization with the laparoscope. The contaminated gloves were then changed prior to resuming the operation.  At this point  a cholangiogram was decided to be performed. Using a  Kumar clamp at what was thought to be the mid body of the gallbladder was clamped and with great difficulty the catheter was placed in the gallbladder. Bile was returned. C-arm was brought up to the field and under fluoroscopy a cholangiogram was obtained. The Kumar catheter was noted to be inserted at the cystic duct junction with the gallbladder. The C-arm was removed and the clamp and catheter were removed under direct visualization.  After the cholangiogram the duct was able to be identified with more aggressive dissection.  The cystic duct was clearly identified and bluntly dissected. The cystic artery was identified immediately posterior to the cystic duct and was also clearly identified and bluntly dissected. Both of these structures were able to be serially clipped and cut. The critical view of safety was never truly able to be identified secondary to the dense posterior adhesions from the gallbladder to the liver fossa. During continued blunt dissection of this dense adhesive disease the clips were inadvertently removed from the cystic artery. The artery was grasped and re-clipped with an Endo Clip.  The remainder of gallbladder attachments to the liver taken from the gallbladder fossa in a retrograde fashion with the electrocautery. The gallbladder was removed and placed in an Endocatch bag. The liver bed was irrigated and inspected. Hemostasis was achieved with the electrocautery. Copious irrigation was utilized and was repeatedly aspirated until clear.  The gallbladder and Endocatch sac were then removed through the epigastric port site.   Due to the extensive inflammatory disease and the difficulty of the operative dissection the decision was then made to place a 19 [place] round Blake drain into the abdomen. The drain was placed into the abdomen through the 12 mm port and removed through the most lateral right upper quadrant incision  site.  Inspection of the right upper quadrant was performed. No bleeding, bile duct injury or leak, or bowel injury was noted. Pneumoperitoneum was released.  The surgically placed drain was secured with a 3-0 nylon suture. 4-0 subcuticular Monocryl was used to close the skin at the remaining laparoscopic incision sites. Steristrips and Mastisol and sterile dressings were  applied.  The patient was then extubated and brought to the recovery room in stable condition. Sponge, lap, and needle counts were correct at closure and at the conclusion of the case.   Findings: Acute on chronic Cholecystitis   Estimated Blood Loss: 50 mL         Drains: 19 French Blake         Specimens: Gallbladder           Complications: none               Ebonye Reade T. Tonita Cong, MD, FACS

## 2016-09-02 NOTE — Anesthesia Procedure Notes (Signed)
Procedure Name: Intubation Date/Time: 09/02/2016 11:24 AM Performed by: Irving Burton Pre-anesthesia Checklist: Patient identified, Emergency Drugs available, Suction available and Patient being monitored Patient Re-evaluated:Patient Re-evaluated prior to inductionOxygen Delivery Method: Circle system utilized Preoxygenation: Pre-oxygenation with 100% oxygen Intubation Type: IV induction Ventilation: Mask ventilation without difficulty Laryngoscope Size: McGraph and 4 Grade View: Grade II Tube type: Oral Tube size: 7.5 mm Number of attempts: 1 Airway Equipment and Method: Stylet Placement Confirmation: positive ETCO2 and breath sounds checked- equal and bilateral Secured at: 20 cm Tube secured with: Tape Dental Injury: Teeth and Oropharynx as per pre-operative assessment  Difficulty Due To: Difficulty was anticipated and Difficult Airway- due to anterior larynx

## 2016-09-02 NOTE — Anesthesia Preprocedure Evaluation (Signed)
Anesthesia Evaluation  Patient identified by MRN, date of birth, ID band Patient awake    Reviewed: Allergy & Precautions, NPO status , Patient's Chart, lab work & pertinent test results, reviewed documented beta blocker date and time   History of Anesthesia Complications (+) history of anesthetic complications  Airway Mallampati: III  TM Distance: >3 FB     Dental  (+) Chipped   Pulmonary sleep apnea , former smoker,           Cardiovascular hypertension, Pt. on medications + CAD       Neuro/Psych PSYCHIATRIC DISORDERS Depression  Neuromuscular disease    GI/Hepatic GERD  Controlled,  Endo/Other  diabetes, Type 2  Renal/GU      Musculoskeletal   Abdominal   Peds  Hematology   Anesthesia Other Findings Very sensitive to our drugs. No CPAP at present. MS.  Reproductive/Obstetrics                             Anesthesia Physical Anesthesia Plan  ASA: III  Anesthesia Plan: General   Post-op Pain Management:    Induction: Intravenous  Airway Management Planned: Oral ETT  Additional Equipment:   Intra-op Plan:   Post-operative Plan:   Informed Consent: I have reviewed the patients History and Physical, chart, labs and discussed the procedure including the risks, benefits and alternatives for the proposed anesthesia with the patient or authorized representative who has indicated his/her understanding and acceptance.     Plan Discussed with: CRNA  Anesthesia Plan Comments:         Anesthesia Quick Evaluation

## 2016-09-03 ENCOUNTER — Encounter: Payer: Self-pay | Admitting: General Surgery

## 2016-09-03 ENCOUNTER — Inpatient Hospital Stay: Payer: Medicare Other

## 2016-09-03 DIAGNOSIS — K8012 Calculus of gallbladder with acute and chronic cholecystitis without obstruction: Secondary | ICD-10-CM | POA: Diagnosis not present

## 2016-09-03 LAB — COMPREHENSIVE METABOLIC PANEL
ALT: 48 U/L (ref 17–63)
AST: 46 U/L — ABNORMAL HIGH (ref 15–41)
Albumin: 3.7 g/dL (ref 3.5–5.0)
Alkaline Phosphatase: 49 U/L (ref 38–126)
Anion gap: 7 (ref 5–15)
BUN: 6 mg/dL (ref 6–20)
CHLORIDE: 100 mmol/L — AB (ref 101–111)
CO2: 32 mmol/L (ref 22–32)
CREATININE: 0.71 mg/dL (ref 0.61–1.24)
Calcium: 8.4 mg/dL — ABNORMAL LOW (ref 8.9–10.3)
GFR calc non Af Amer: >60 mL/min (ref >60)
Glucose, Bld: 158 mg/dL — ABNORMAL HIGH (ref 65–99)
Potassium: 4.3 mmol/L (ref 3.5–5.1)
SODIUM: 139 mmol/L (ref 135–145)
Total Bilirubin: 0.8 mg/dL (ref 0.3–1.2)
Total Protein: 6.9 g/dL (ref 6.5–8.1)

## 2016-09-03 LAB — CBC
HCT: 41.4 % (ref 40.0–52.0)
Hemoglobin: 14.6 g/dL (ref 13.0–18.0)
MCH: 33.2 pg (ref 26.0–34.0)
MCHC: 35.3 g/dL (ref 32.0–36.0)
MCV: 94 fL (ref 80.0–100.0)
PLATELETS: 172 10*3/uL (ref 150–440)
RBC: 4.4 MIL/uL (ref 4.40–5.90)
RDW: 14.7 % — ABNORMAL HIGH (ref 11.5–14.5)
WBC: 11.2 10*3/uL — ABNORMAL HIGH (ref 3.8–10.6)

## 2016-09-03 LAB — GLUCOSE, CAPILLARY: Glucose-Capillary: 190 mg/dL — ABNORMAL HIGH (ref 65–99)

## 2016-09-03 MED ORDER — HYDROCODONE-ACETAMINOPHEN 5-325 MG PO TABS: 1.0000 | ORAL_TABLET | ORAL | 0 refills | Status: DC | PRN

## 2016-09-03 NOTE — Discharge Summary (Signed)
Patient ID: Jesus Sanchez MRN: [id number] DOB/AGE: 03/02/1951 66 y.o.  Admit date: 09/02/2016 Discharge date: 09/03/2016  Discharge Diagnoses:  Acute on Chronic Cholecystitis  Procedures Performed: Laparoscopic cholecystectomy with intraoperative cholangriogram  Discharged Condition: good  Hospital Course: Patient brought into hospital for a planned cholecystectomy. Found to have extensive inflammation and a technically difficult surgery. Tolerated procedure well. On day of discharge he was tolerating a regular diet, had his pain controlled with oral medications and was doing well. On exam his abdomen was benign, JP that was left in place draining only sero-sanguinous fluid. No evidence of bilious fluid.  Discharge Orders: Discharge Instructions    Call MD for:  difficulty breathing, headache or visual disturbances    Complete by:  As directed    Call MD for:  extreme fatigue    Complete by:  As directed    Call MD for:  hives    Complete by:  As directed    Call MD for:  persistant dizziness or light-headedness    Complete by:  As directed    Call MD for:  persistant nausea and vomiting    Complete by:  As directed    Call MD for:  redness, tenderness, or signs of infection (pain, swelling, redness, odor or green/yellow discharge around incision site)    Complete by:  As directed    Call MD for:  severe uncontrolled pain    Complete by:  As directed    Call MD for:  temperature >100.4    Complete by:  As directed    Diet - low sodium heart healthy    Complete by:  As directed    Discharge instructions    Complete by:  As directed    Please teach pt about JP care. May shower tomorrow   Increase activity slowly    Complete by:  As directed    Lifting restrictions    Complete by:  As directed    20 lbs x 6 wks   Remove dressing in 24 hours    Complete by:  As directed       Disposition: 01-Home or Self Care  Discharge Medications: Allergies as of 09/03/2016       Reactions   Contrast Media [iodinated Diagnostic Agents] Itching   Itching x 3 days   Oxycodone-acetaminophen Itching      Medication List    TAKE these medications   aspirin EC 81 MG tablet Take 81 mg by mouth daily.   atorvastatin 10 MG tablet Commonly known as:  LIPITOR Take 10 mg by mouth at bedtime.   baclofen 10 MG/5ML Soln injection Commonly known as:  LIORESAL 2,000 mcg by Intrathecal route every 6 (six) months. 7.2833 ug/h   citalopram 40 MG tablet Commonly known as:  CELEXA Take 40 mg by mouth daily.   gabapentin 300 MG capsule Commonly known as:  NEURONTIN Take 600-900 mg by mouth 2 (two) times daily. 900 mg at hs 600mg   in am   HYDROcodone-acetaminophen 5-325 MG tablet Commonly known as:  NORCO/VICODIN Take 1-2 tablets by mouth every 4 (four) hours as needed for moderate pain or severe pain.   ibuprofen 200 MG tablet Commonly known as:  ADVIL,MOTRIN Take 400 mg by mouth every 8 (eight) hours as needed (for pain.).   losartan-hydrochlorothiazide 100-25 MG tablet Commonly known as:  HYZAAR Take 1 tablet by mouth daily.   metFORMIN 500 MG tablet Commonly known as:  GLUCOPHAGE Take 1,000 mg by mouth 2 (two)  times daily with a meal.   oxybutynin 5 MG tablet Commonly known as:  DITROPAN Take 5 mg by mouth every 8 (eight) hours as needed (for urinary incontinence).   pantoprazole 40 MG tablet Commonly known as:  PROTONIX Take 40 mg by mouth 2 (two) times daily.   sulfamethoxazole-trimethoprim 800-160 MG tablet Commonly known as:  BACTRIM DS,SEPTRA DS Take 1 tablet by mouth 2 (two) times daily.   TYSABRI 300 MG/15ML injection Generic drug:  natalizumab Inject 300 mg into the vein every 30 (thirty) days. Received at [facility] in [facility]: Follow-up Information    Leafy Ro, MD Follow up.   Specialty:  General Surgery Contact information: [address] [place] [address] [phone]            Signed: Ricarda Frame 09/03/2016, 7:10 PM

## 2016-09-03 NOTE — Discharge Instructions (Signed)
Laparoscopic Cholecystectomy, Care After °This sheet gives you information about how to care for yourself after your procedure. Your doctor may also give you more specific instructions. If you have problems or questions, contact your doctor. °Follow these instructions at home: °Care for cuts from surgery (incisions) ° °· Follow instructions from your doctor about how to take care of your cuts from surgery. Make sure you: °? Wash your hands with soap and water before you change your bandage (dressing). If you cannot use soap and water, use hand sanitizer. °? Change your bandage as told by your doctor. °? Leave stitches (sutures), skin glue, or skin tape (adhesive) strips in place. They may need to stay in place for 2 weeks or longer. If tape strips get loose and curl up, you may trim the loose edges. Do not remove tape strips completely unless your doctor says it is okay. °· Do not take baths, swim, or use a hot tub until your doctor says it is okay. Ask your doctor if you can take showers. You may only be allowed to take sponge baths for bathing. °· Check your surgical cut area every day for signs of infection. Check for: °? More redness, swelling, or pain. °? More fluid or blood. °? Warmth. °? Pus or a bad smell. °Activity °· Do not drive or use heavy machinery while taking prescription pain medicine. °· Do not lift anything that is heavier than 10 lb (4.5 kg) until your doctor says it is okay. °· Do not play contact sports until your doctor says it is okay. °· Do not drive for 24 hours if you were given a medicine to help you relax (sedative). °· Rest as needed. Do not return to work or school until your doctor says it is okay. °General instructions °· Take over-the-counter and prescription medicines only as told by your doctor. °· To prevent or treat constipation while you are taking prescription pain medicine, your doctor may recommend that you: °? Drink enough fluid to keep your pee (urine) clear or pale  yellow. °? Take over-the-counter or prescription medicines. °? Eat foods that are high in fiber, such as fresh fruits and vegetables, whole grains, and beans. °? Limit foods that are high in fat and processed sugars, such as fried and sweet foods. °Contact a doctor if: °· You develop a rash. °· You have more redness, swelling, or pain around your surgical cuts. °· You have more fluid or blood coming from your surgical cuts. °· Your surgical cuts feel warm to the touch. °· You have pus or a bad smell coming from your surgical cuts. °· You have a fever. °· One or more of your surgical cuts breaks open. °Get help right away if: °· You have trouble breathing. °· You have chest pain. °· You have pain that is getting worse in your shoulders. °· You faint or feel dizzy when you stand. °· You have very bad pain in your belly (abdomen). °· You are sick to your stomach (nauseous) for more than one day. °· You have throwing up (vomiting) that lasts for more than one day. °· You have leg pain. °This information is not intended to replace advice given to you by your health care provider. Make sure you discuss any questions you have with your health care provider. °Document Released: 04/13/2008 Document Revised: 01/24/2016 Document Reviewed: 12/22/2015 °Elsevier Interactive Patient Education © 2017 [employer] ° °

## 2016-09-03 NOTE — Progress Notes (Signed)
Per Dr. Everlene Farrier okay to flush suprapubic catheter per pt request

## 2016-09-03 NOTE — Progress Notes (Signed)
Pt A and O x 4. VSS. Pt tolerating diet well. No complaints of pain or nausea. IV removed intact, prescriptions given. Pt voiced understanding of discharge instructions with no further questions. Teaching on JP drain given. Suprapubic foley flushed. Pt discharged via power wheelchair with nurse aide.

## 2016-09-03 NOTE — Care Management Obs Status (Signed)
MEDICARE OBSERVATION STATUS NOTIFICATION   Patient Details  Name: Jesus Sanchez MRN: [id number] Date of Birth: 01/28/1951   Medicare Observation Status Notification Given:  No (admitted observation less than 24 hours)    Chapman Fitch, RN 09/03/2016, 10:18 AM

## 2016-09-06 LAB — SURGICAL PATHOLOGY

## 2016-09-09 ENCOUNTER — Ambulatory Visit (INDEPENDENT_AMBULATORY_CARE_PROVIDER_SITE_OTHER): Payer: Medicare Other | Admitting: Surgery

## 2016-09-09 ENCOUNTER — Encounter: Payer: Self-pay | Admitting: Surgery

## 2016-09-09 VITALS — BP 131/75 | HR 87 | Temp 98.3°F | Ht 69.0 in | Wt 208.0 lb

## 2016-09-09 DIAGNOSIS — Z09 Encounter for follow-up examination after completed treatment for conditions other than malignant neoplasm: Secondary | ICD-10-CM

## 2016-09-09 NOTE — Patient Instructions (Addendum)
We will see you back in 2 weeks. Please see scheduled appointment below.  Please call with any further questions or concerns.

## 2016-09-09 NOTE — Progress Notes (Signed)
S/p lap chole and drain placement by dr. Tonita Cong last week Doing very well Tolerating PO, no fevers Less than 20 cc/day from drain Path d/w pt ( cholecystitis)  PE NAD Abd: soft, incisions c/d/i, no infection, drain serous fluid ( removed.  A/P Doing well No compications RTC prn

## 2016-09-10 ENCOUNTER — Inpatient Hospital Stay: Payer: Medicare Other

## 2016-09-17 ENCOUNTER — Inpatient Hospital Stay: Payer: Medicare Other

## 2016-09-22 NOTE — Progress Notes (Signed)
[facility]  Telephone:(336) [phone]  [phone]     Jesus Sanchez DOB: [birth year]  MR#: [id number]  AOZ#:[id number]  Patient Care Team: Lauro Regulus, MD as PCP - General (Internal Medicine)  CHIEF COMPLAINT:  Multiple Sclerosis  INTERVAL HISTORY:  Patient returns to clinic today for routine 6 month evaluation. He continues to [facility] for treatment of MS. He currently feels well and is at his baseline. He is confined to a motorized wheelchair and has very limited use of lower extremities. He is capable of transferring but is unable to bear any weight. He has a Baclofen pump in place. He also has a suprapubic catheter in place for neurogenic bladder. Patient offers no further specific complaints today.  REVIEW OF SYSTEMS:   Review of Systems  Constitutional: Positive for malaise/fatigue. Negative for fever and weight loss.  Respiratory: Negative.  Negative for cough and shortness of breath.   Cardiovascular: Negative.  Negative for chest pain.  Gastrointestinal: Negative.  Negative for abdominal pain.  Genitourinary: Negative.   Musculoskeletal: Negative.   Neurological: Positive for weakness.  Psychiatric/Behavioral: Negative.     As per HPI. Otherwise, a complete review of systems is negative.   PAST MEDICAL HISTORY: Past Medical History:  Diagnosis Date  . Complication of anesthesia    with sedation had to have narcan 1 month ago  . Coronary artery disease   . Depression   . Diabetes mellitus without complication (HCC)   . GERD (gastroesophageal reflux disease)   . Hypertension   . MS (multiple sclerosis) (HCC) 10/02/2015  . Neuromuscular disorder (HCC)    multiple sclerosis  . Sleep apnea    no cpap now being re evaluated    PAST SURGICAL HISTORY: Past Surgical History:  Procedure Laterality Date  . CHOLECYSTECTOMY N/A 09/02/2016   Procedure: LAPAROSCOPIC CHOLECYSTECTOMY WITH CHOLANGIOGRAM;  Surgeon: Ricarda Frame, MD;   Location: [facility] ORS;  Service: General;  Laterality: N/A;  . CORONARY ANGIOPLASTY     stent 2000  . HERNIA REPAIR  2014   [facility]- Dr. Egbert Garibaldi  . IR GENERIC HISTORICAL  07/02/2016   IR PERC CHOLECYSTOSTOMY 07/02/2016 Richarda Overlie, MD [facility]INTER[facility] RAD    FAMILY HISTORY: Reviewed and unchanged. No reported history of malignancy or chronic disease.  GYNECOLOGIC HISTORY:  No LMP for male patient.     ADVANCED DIRECTIVES:    HEALTH MAINTENANCE: Social History  Substance Use Topics  . Smoking status: Former Smoker    Packs/day: 2.00    Years: 25.00    Quit date: 08/24/1978  . Smokeless tobacco: Former Neurosurgeon    Quit date: 05/20/1979  . Alcohol use No    Allergies  Allergen Reactions  . Contrast Media [Iodinated Diagnostic Agents] Itching    Itching x 3 days  . Oxycodone-Acetaminophen Itching    Current Outpatient Prescriptions  Medication Sig Dispense Refill  . aspirin EC 81 MG tablet Take 81 mg by mouth daily.     Marland Kitchen atorvastatin (LIPITOR) 10 MG tablet Take 10 mg by mouth at bedtime.     . baclofen (LIORESAL) 10 MG/5ML SOLN injection 2,000 mcg by Intrathecal route every 6 (six) months. 9.6295 ug/h    . citalopram (CELEXA) 40 MG tablet Take 40 mg by mouth daily.     Marland Kitchen gabapentin (NEURONTIN) 300 MG capsule Take 600-900 mg by mouth 2 (two) times daily. 900 mg at hs 600mg   in am    . ibuprofen (ADVIL,MOTRIN) 200 MG tablet Take 400 mg by mouth every  8 (eight) hours as needed (for pain.).    Marland Kitchen losartan-hydrochlorothiazide (HYZAAR) 100-25 MG tablet Take 1 tablet by mouth daily.    . metFORMIN (GLUCOPHAGE) 500 MG tablet Take 1,000 mg by mouth 2 (two) times daily with a meal.    . natalizumab (TYSABRI) 300 MG/15ML injection Inject 300 mg into the vein every 30 (thirty) days. Received at [facility] in [place]    . oxybutynin (DITROPAN) 5 MG tablet Take 5 mg by mouth every 8 (eight) hours as needed (for urinary incontinence).     . pantoprazole (PROTONIX) 40 MG tablet Take 40 mg by mouth 2 (two)  times daily.      No current facility-administered medications for this visit.    Facility-Administered Medications Ordered in Other Visits  Medication Dose Route Frequency Provider Last Rate Last Dose  . 0.9 %  sodium chloride infusion   Intravenous Once Jeralyn Ruths, MD      . acetaminophen (TYLENOL) tablet 1,000 mg  1,000 mg Oral Once Jeralyn Ruths, MD      . natalizumab (TYSABRI) 300 mg in sodium chloride 0.9 % 100 mL IVPB  300 mg Intravenous Once Jeralyn Ruths, MD      . natalizumab (TYSABRI) 300 mg in sodium chloride 0.9 % 100 mL IVPB  300 mg Intravenous Once Jeralyn Ruths, MD 115 mL/hr at 09/24/16 1401 300 mg at 09/24/16 1401    OBJECTIVE: There were no vitals taken for this visit.   There is no height or weight on file to calculate BMI.    ECOG FS:3 - Symptomatic, >50% confined to bed  General: Well-developed, well-nourished, no acute distress. Sitting in wheelchair. Eyes: Pink conjunctiva, anicteric sclera. Lungs: Clear to auscultation bilaterally. Heart: Regular rate and rhythm. No rubs, murmurs, or gallops. Abdomen: Soft, nontender, nondistended. No organomegaly noted, normoactive bowel sounds. Musculoskeletal: BLE weakness, right> left, No edema, cyanosis, or clubbing. Neuro: Alert, answering all questions appropriately. Cranial nerves are grossly intact. Skin: No rashes or petechiae noted. Psych: Normal affect.   LAB RESULTS:  STUDIES: No results found.  ASSESSMENT:  Multiple Sclerosis.  PLAN:   1. Multiple sclerosis: Continue Tysabri every 28 days as prescribed by his Neurologist. Prescription has been received and is scanned into our system. The patient was instructed that the decision about the treatment of multiple sclerosis, the regimen for treatment, and management of side effects is up to the patient's neurologist. The patient understands and agrees to contact Dr. Elwyn Reach with any questions in this regard, though we are always available to  assist with emergencies.  He will return to clinic every 4 weeks for Tysabri infusions and then in 6 months for further evaluation.    Patient expressed understanding and was in agreement with this plan. He also understands that He can call his Neurology clinic at any time with any questions, concerns, or complaints.    Jeralyn Ruths, MD   09/24/2016 2:14 PM

## 2016-09-23 ENCOUNTER — Ambulatory Visit (INDEPENDENT_AMBULATORY_CARE_PROVIDER_SITE_OTHER): Payer: Medicare Other | Admitting: General Surgery

## 2016-09-23 ENCOUNTER — Encounter: Payer: Self-pay | Admitting: General Surgery

## 2016-09-23 VITALS — BP 138/87 | HR 76 | Temp 97.5°F | Ht 69.0 in | Wt 208.0 lb

## 2016-09-23 DIAGNOSIS — Z4889 Encounter for other specified surgical aftercare: Secondary | ICD-10-CM

## 2016-09-23 MED ORDER — SODIUM CHLORIDE 0.9 % IV SOLN
Freq: Once | INTRAVENOUS | Status: DC
  Filled 2016-09-23: qty 1000

## 2016-09-23 MED ORDER — SODIUM CHLORIDE 0.9 % IV SOLN
300.0000 mg | Freq: Once | INTRAVENOUS | Status: AC
  Administered 2016-09-24: 300 mg via INTRAVENOUS
  Filled 2016-09-23: qty 15

## 2016-09-23 MED ORDER — SODIUM CHLORIDE 0.9 % IV SOLN
Freq: Once | INTRAVENOUS | Status: AC
  Administered 2016-09-24: 14:00:00 via INTRAVENOUS
  Filled 2016-09-23: qty 1000

## 2016-09-23 MED ORDER — ACETAMINOPHEN 500 MG PO TABS
1000.0000 mg | ORAL_TABLET | Freq: Once | ORAL | Status: AC
  Administered 2016-09-24: 1000 mg via ORAL
  Filled 2016-09-23: qty 2

## 2016-09-23 NOTE — Progress Notes (Signed)
Outpatient Surgical Follow Up  09/23/2016  Jesus Sanchez is an 66 y.o. male.   Chief Complaint  Patient presents with  . Routine Post Op    Laparoscopic Cholecystectomy (09/02/16)- Dr. Tonita Cong    HPI: 66 year old male returns to clinic for 6 week follow-up status post laparoscopic cholecystectomy. Patient reports from abdominal standpoint is done very well. He denies any abdominal pain, nausea, vomiting, chest pain, shortness of breath. He has been having continued bowel function and rectal pain secondary to a known anal fissure. This is being worked up prior to the development of cholecystitis.  Past Medical History:  Diagnosis Date  . Complication of anesthesia    with sedation had to have narcan 1 month ago  . Coronary artery disease   . Depression   . Diabetes mellitus without complication (HCC)   . GERD (gastroesophageal reflux disease)   . Hypertension   . MS (multiple sclerosis) (HCC) 10/02/2015  . Neuromuscular disorder (HCC)    multiple sclerosis  . Sleep apnea    no cpap now being re evaluated    Past Surgical History:  Procedure Laterality Date  . CHOLECYSTECTOMY N/A 09/02/2016   Procedure: LAPAROSCOPIC CHOLECYSTECTOMY WITH CHOLANGIOGRAM;  Surgeon: Ricarda Frame, MD;  Location: [facility] ORS;  Service: General;  Laterality: N/A;  . CORONARY ANGIOPLASTY     stent 2000  . HERNIA REPAIR  2014   [facility]- Dr. Egbert Garibaldi  . IR GENERIC HISTORICAL  07/02/2016   IR PERC CHOLECYSTOSTOMY 07/02/2016 Jesus Overlie, MD [facility]    Family History  Problem Relation Age of Onset  . Ulcerative colitis Mother   . Diabetes Mother   . Diabetes Sister   . Colon polyps Sister     Social History:  reports that he quit smoking about 38 years ago. He has a 50.00 pack-year smoking history. He quit smokeless tobacco use about 37 years ago. He reports that he does not drink alcohol or use drugs.  Allergies:  Allergies  Allergen Reactions  . Contrast Media [Iodinated Diagnostic Agents]  Itching    Itching x 3 days  . Oxycodone-Acetaminophen Itching    Medications reviewed.    ROS A multipoint review of systems was completed. All pertinent positives and negatives are documented within the history of present illness and remainder are negative.   BP 138/87   Pulse 76   Temp 97.5 F (36.4 C) (Oral)   Ht 5\' 9"  (1.753 m)   Wt 94.3 kg (208 lb)   BMI 30.72 kg/m   Physical Exam Gen.: No acute distress Chest: Clear to auscultation Heart: Regular rhythm Abdomen: Soft, nontender, nondistended. Multiple laparoscopic incision sites are all healing well. No evidence of infection or drainage from any site.    No results found for this or any previous visit (from the past 48 hour(s)). No results found.  Assessment/Plan:  1. Aftercare following surgery 66 year old male 6 weeks status post laparoscopic cholecystectomy. Doing well from a postcholecystectomy standpoint. Tolerating a diet and without any significant abdominal pain. Only complaints currently related to the previously diagnosed anal fissure. He had been referred to a tertiary care center for his anal fissure prior to his cholecystitis. Reiterated to the patient and to his wife the reasons why I recommend a tertiary care center in the setting. With his multiple sclerosis his care will be complicated and should be performed by a specialist. All questions answered to their satisfaction. They'll follow-up in clinic on an as-needed basis.     Ricarda Frame, MD  FACS General Surgeon  09/23/2016,10:17 AM

## 2016-09-23 NOTE — Patient Instructions (Addendum)
We will resend your referral to [facility] to see the Colorectal Surgeon. I will call you with this appointment as soon as it has been made.  Their information is:  [facility] [address] [place] [address] [phone]  You are doing GREAT in regards to your gallbladder surgery! If you have any questions or concerns, please call our office.

## 2016-09-24 ENCOUNTER — Inpatient Hospital Stay: Payer: Medicare Other | Attending: Oncology

## 2016-09-24 ENCOUNTER — Telehealth: Payer: Self-pay

## 2016-09-24 ENCOUNTER — Inpatient Hospital Stay (HOSPITAL_BASED_OUTPATIENT_CLINIC_OR_DEPARTMENT_OTHER): Payer: Medicare Other | Admitting: Oncology

## 2016-09-24 VITALS — BP 137/84 | HR 81 | Temp 97.3°F | Resp 20

## 2016-09-24 DIAGNOSIS — Z79899 Other long term (current) drug therapy: Secondary | ICD-10-CM | POA: Diagnosis not present

## 2016-09-24 DIAGNOSIS — N319 Neuromuscular dysfunction of bladder, unspecified: Secondary | ICD-10-CM | POA: Diagnosis not present

## 2016-09-24 DIAGNOSIS — E119 Type 2 diabetes mellitus without complications: Secondary | ICD-10-CM

## 2016-09-24 DIAGNOSIS — Z7984 Long term (current) use of oral hypoglycemic drugs: Secondary | ICD-10-CM | POA: Insufficient documentation

## 2016-09-24 DIAGNOSIS — K219 Gastro-esophageal reflux disease without esophagitis: Secondary | ICD-10-CM | POA: Insufficient documentation

## 2016-09-24 DIAGNOSIS — R5383 Other fatigue: Secondary | ICD-10-CM | POA: Insufficient documentation

## 2016-09-24 DIAGNOSIS — R5381 Other malaise: Secondary | ICD-10-CM

## 2016-09-24 DIAGNOSIS — G4730 Sleep apnea, unspecified: Secondary | ICD-10-CM | POA: Diagnosis not present

## 2016-09-24 DIAGNOSIS — I2510 Atherosclerotic heart disease of native coronary artery without angina pectoris: Secondary | ICD-10-CM | POA: Insufficient documentation

## 2016-09-24 DIAGNOSIS — Z87891 Personal history of nicotine dependence: Secondary | ICD-10-CM | POA: Insufficient documentation

## 2016-09-24 DIAGNOSIS — I10 Essential (primary) hypertension: Secondary | ICD-10-CM

## 2016-09-24 DIAGNOSIS — Z7982 Long term (current) use of aspirin: Secondary | ICD-10-CM | POA: Insufficient documentation

## 2016-09-24 DIAGNOSIS — G35 Multiple sclerosis: Secondary | ICD-10-CM | POA: Diagnosis not present

## 2016-09-24 DIAGNOSIS — R531 Weakness: Secondary | ICD-10-CM | POA: Insufficient documentation

## 2016-09-24 NOTE — Telephone Encounter (Signed)
I have faxed a referral to [facility] Phone #: [phone] Fax #: [phone] & received a confirmation.   I will follow up within 3-5 days to make sure the appointments have been scheduled.   

## 2016-10-01 ENCOUNTER — Ambulatory Visit: Payer: Medicare Other | Admitting: Oncology

## 2016-10-01 ENCOUNTER — Inpatient Hospital Stay: Payer: Medicare Other

## 2016-10-04 DIAGNOSIS — Z0000 Encounter for general adult medical examination without abnormal findings: Secondary | ICD-10-CM | POA: Insufficient documentation

## 2016-10-08 ENCOUNTER — Inpatient Hospital Stay: Payer: Medicare Other

## 2016-10-08 ENCOUNTER — Ambulatory Visit: Payer: Medicare Other | Admitting: Oncology

## 2016-10-15 ENCOUNTER — Ambulatory Visit: Payer: Medicare Other | Admitting: Oncology

## 2016-10-15 ENCOUNTER — Inpatient Hospital Stay: Payer: Medicare Other

## 2016-10-20 NOTE — Telephone Encounter (Signed)
[facility] has made several attempts in scheduling the patient for his appointment but the patient keeps canceling them. They have sent him a [facility] my chart message for him to contact them to reschedule.

## 2016-10-21 MED ORDER — ACETAMINOPHEN 500 MG PO TABS
1000.0000 mg | ORAL_TABLET | Freq: Once | ORAL | Status: AC
  Administered 2016-10-22: 1000 mg via ORAL
  Filled 2016-10-21: qty 2

## 2016-10-21 MED ORDER — SODIUM CHLORIDE 0.9 % IV SOLN
300.0000 mg | Freq: Once | INTRAVENOUS | Status: AC
  Administered 2016-10-22: 300 mg via INTRAVENOUS
  Filled 2016-10-21: qty 15

## 2016-10-21 MED ORDER — SODIUM CHLORIDE 0.9 % IV SOLN
Freq: Once | INTRAVENOUS | Status: AC
  Administered 2016-10-22: 13:00:00 via INTRAVENOUS
  Filled 2016-10-21: qty 1000

## 2016-10-22 ENCOUNTER — Inpatient Hospital Stay: Payer: Medicare Other | Attending: Family Medicine

## 2016-10-22 VITALS — BP 150/88 | HR 91 | Temp 97.1°F | Resp 18

## 2016-10-22 DIAGNOSIS — Z79899 Other long term (current) drug therapy: Secondary | ICD-10-CM | POA: Insufficient documentation

## 2016-10-22 DIAGNOSIS — G35 Multiple sclerosis: Secondary | ICD-10-CM | POA: Insufficient documentation

## 2016-10-22 NOTE — Patient Instructions (Signed)

## 2016-11-19 ENCOUNTER — Inpatient Hospital Stay: Payer: Medicare Other

## 2016-11-19 MED ORDER — ACETAMINOPHEN 500 MG PO TABS
1000.0000 mg | ORAL_TABLET | Freq: Once | ORAL | Status: AC
  Administered 2016-11-22: 1000 mg via ORAL
  Filled 2016-11-19: qty 2

## 2016-11-19 MED ORDER — SODIUM CHLORIDE 0.9 % IV SOLN
300.0000 mg | Freq: Once | INTRAVENOUS | Status: AC
  Administered 2016-11-22: 300 mg via INTRAVENOUS
  Filled 2016-11-19: qty 15

## 2016-11-19 MED ORDER — SODIUM CHLORIDE 0.9 % IV SOLN
Freq: Once | INTRAVENOUS | Status: AC
  Administered 2016-11-22: 11:00:00 via INTRAVENOUS
  Filled 2016-11-19: qty 1000

## 2016-11-22 ENCOUNTER — Inpatient Hospital Stay: Payer: Medicare Other | Attending: Oncology

## 2016-11-22 VITALS — BP 133/80 | HR 74 | Temp 97.4°F | Resp 18

## 2016-11-22 DIAGNOSIS — Z79899 Other long term (current) drug therapy: Secondary | ICD-10-CM | POA: Insufficient documentation

## 2016-11-22 DIAGNOSIS — G35 Multiple sclerosis: Secondary | ICD-10-CM | POA: Insufficient documentation

## 2016-11-22 NOTE — Patient Instructions (Signed)

## 2016-12-17 ENCOUNTER — Inpatient Hospital Stay: Payer: Medicare Other

## 2016-12-20 ENCOUNTER — Inpatient Hospital Stay: Payer: Medicare Other

## 2016-12-21 MED ORDER — ACETAMINOPHEN 500 MG PO TABS
1000.0000 mg | ORAL_TABLET | Freq: Once | ORAL | Status: AC
  Administered 2016-12-22: 1000 mg via ORAL
  Filled 2016-12-21: qty 2

## 2016-12-21 MED ORDER — NATALIZUMAB 300 MG/15ML IV CONC
300.0000 mg | Freq: Once | INTRAVENOUS | Status: AC
  Administered 2016-12-22: 300 mg via INTRAVENOUS
  Filled 2016-12-21: qty 15

## 2016-12-21 MED ORDER — SODIUM CHLORIDE 0.9 % IV SOLN
Freq: Once | INTRAVENOUS | Status: AC
  Administered 2016-12-22: 14:00:00 via INTRAVENOUS
  Filled 2016-12-21: qty 1000

## 2016-12-22 ENCOUNTER — Inpatient Hospital Stay: Payer: Medicare Other | Attending: Family Medicine

## 2016-12-22 VITALS — BP 107/69 | HR 62 | Temp 95.8°F | Resp 18

## 2016-12-22 DIAGNOSIS — Z79899 Other long term (current) drug therapy: Secondary | ICD-10-CM | POA: Insufficient documentation

## 2016-12-22 DIAGNOSIS — G35 Multiple sclerosis: Secondary | ICD-10-CM | POA: Diagnosis present

## 2016-12-22 NOTE — Patient Instructions (Signed)

## 2017-01-14 ENCOUNTER — Inpatient Hospital Stay: Payer: Medicare Other

## 2017-01-17 ENCOUNTER — Inpatient Hospital Stay: Payer: Medicare Other

## 2017-01-18 ENCOUNTER — Inpatient Hospital Stay: Payer: Medicare Other | Attending: Family Medicine

## 2017-01-18 VITALS — BP 118/68 | HR 65 | Temp 96.8°F | Resp 18

## 2017-01-18 DIAGNOSIS — G35 Multiple sclerosis: Secondary | ICD-10-CM | POA: Diagnosis present

## 2017-01-18 DIAGNOSIS — Z79899 Other long term (current) drug therapy: Secondary | ICD-10-CM | POA: Diagnosis not present

## 2017-01-18 MED ORDER — SODIUM CHLORIDE 0.9 % IV SOLN
300.0000 mg | Freq: Once | INTRAVENOUS | Status: AC
  Administered 2017-01-18: 300 mg via INTRAVENOUS
  Filled 2017-01-18: qty 15

## 2017-01-18 MED ORDER — ACETAMINOPHEN 500 MG PO TABS
1000.0000 mg | ORAL_TABLET | Freq: Once | ORAL | Status: AC
  Administered 2017-01-18: 1000 mg via ORAL
  Filled 2017-01-18: qty 2

## 2017-01-18 MED ORDER — SODIUM CHLORIDE 0.9 % IV SOLN
Freq: Once | INTRAVENOUS | Status: AC
  Administered 2017-01-18: 13:00:00 via INTRAVENOUS
  Filled 2017-01-18: qty 1000

## 2017-02-11 ENCOUNTER — Inpatient Hospital Stay: Payer: Medicare Other

## 2017-02-14 ENCOUNTER — Inpatient Hospital Stay: Payer: Medicare Other

## 2017-02-14 MED ORDER — ACETAMINOPHEN 500 MG PO TABS
1000.0000 mg | ORAL_TABLET | Freq: Once | ORAL | Status: AC
  Administered 2017-02-15: 1000 mg via ORAL

## 2017-02-14 MED ORDER — SODIUM CHLORIDE 0.9 % IV SOLN
Freq: Once | INTRAVENOUS | Status: AC
  Administered 2017-02-15: 11:00:00 via INTRAVENOUS
  Filled 2017-02-14: qty 1000

## 2017-02-14 MED ORDER — SODIUM CHLORIDE 0.9 % IV SOLN
300.0000 mg | Freq: Once | INTRAVENOUS | Status: AC
  Administered 2017-02-15: 300 mg via INTRAVENOUS
  Filled 2017-02-14: qty 15

## 2017-02-15 ENCOUNTER — Inpatient Hospital Stay: Payer: Medicare Other

## 2017-02-15 VITALS — BP 144/84 | HR 69 | Temp 97.2°F | Resp 20

## 2017-02-15 DIAGNOSIS — G35 Multiple sclerosis: Secondary | ICD-10-CM | POA: Diagnosis not present

## 2017-03-11 ENCOUNTER — Inpatient Hospital Stay: Payer: Medicare Other

## 2017-03-11 ENCOUNTER — Ambulatory Visit: Payer: Medicare Other | Admitting: Oncology

## 2017-03-15 NOTE — Progress Notes (Signed)
[facility]  Telephone:(336) [phone]  Fax:(336) [phone]     Jesus Sanchez DOB: [birth year]  MR#: [id number]  AOZ#:[id number]  Patient Care Team: Lauro Regulus, MD as PCP - General (Internal Medicine)  CHIEF COMPLAINT:  Multiple Sclerosis  INTERVAL HISTORY:  Patient returns to clinic today for routine 6 month evaluation. He continues to [facility] for treatment of MS. He currently feels well and is at his baseline. He is confined to a motorized wheelchair and has very limited use of lower extremities. He is capable of transferring but is unable to bear any weight. He has a Baclofen pump in place. He also has a suprapubic catheter in place for neurogenic bladder. Patient offers no further specific complaints today.  REVIEW OF SYSTEMS:   Review of Systems  Constitutional: Positive for malaise/fatigue. Negative for fever and weight loss.  Respiratory: Negative.  Negative for cough and shortness of breath.   Cardiovascular: Negative.  Negative for chest pain and leg swelling.  Gastrointestinal: Negative.  Negative for abdominal pain.  Genitourinary: Negative.   Musculoskeletal: Negative.   Skin: Negative.  Negative for rash.  Neurological: Positive for focal weakness and weakness. Negative for tingling and sensory change.  Psychiatric/Behavioral: Negative.  The patient is not nervous/anxious.     As per HPI. Otherwise, a complete review of systems is negative.   PAST MEDICAL HISTORY: Past Medical History:  Diagnosis Date  . Complication of anesthesia    with sedation had to have narcan 1 month ago  . Coronary artery disease   . Depression   . Diabetes mellitus without complication (HCC)   . GERD (gastroesophageal reflux disease)   . Hypertension   . MS (multiple sclerosis) (HCC) 10/02/2015  . Neuromuscular disorder (HCC)    multiple sclerosis  . Sleep apnea    no cpap now being re evaluated    PAST SURGICAL HISTORY: Past Surgical History:    Procedure Laterality Date  . CHOLECYSTECTOMY N/A 09/02/2016   Procedure: LAPAROSCOPIC CHOLECYSTECTOMY WITH CHOLANGIOGRAM;  Surgeon: Ricarda Frame, MD;  Location: [facility] ORS;  Service: General;  Laterality: N/A;  . CORONARY ANGIOPLASTY     stent 2000  . HERNIA REPAIR  2014   [facility]- Dr. Egbert Garibaldi  . IR GENERIC HISTORICAL  07/02/2016   IR PERC CHOLECYSTOSTOMY 07/02/2016 Richarda Overlie, MD [facility]    FAMILY HISTORY: Reviewed and unchanged. No reported history of malignancy or chronic disease.  GYNECOLOGIC HISTORY:  No LMP for male patient.     ADVANCED DIRECTIVES:    HEALTH MAINTENANCE: Social History  Substance Use Topics  . Smoking status: Former Smoker    Packs/day: 2.00    Years: 25.00    Quit date: 08/24/1978  . Smokeless tobacco: Former Neurosurgeon    Quit date: 05/20/1979  . Alcohol use No    Allergies  Allergen Reactions  . Contrast Media [Iodinated Diagnostic Agents] Itching    Itching x 3 days  . Oxycodone-Acetaminophen Itching    Current Outpatient Prescriptions  Medication Sig Dispense Refill  . aspirin EC 81 MG tablet Take 81 mg by mouth daily.     Marland Kitchen atorvastatin (LIPITOR) 10 MG tablet Take 10 mg by mouth at bedtime.     . baclofen (LIORESAL) 10 MG/5ML SOLN injection 2,000 mcg by Intrathecal route every 6 (six) months. 9.6295 ug/h    . citalopram (CELEXA) 40 MG tablet Take 40 mg by mouth daily.     Marland Kitchen gabapentin (NEURONTIN) 300 MG capsule Take 600-900 mg by mouth 2 (  two) times daily. 900 mg at hs 600mg   in am    . ibuprofen (ADVIL,MOTRIN) 200 MG tablet Take 400 mg by mouth every 8 (eight) hours as needed (for pain.).    Marland Kitchen losartan-hydrochlorothiazide (HYZAAR) 100-25 MG tablet Take 1 tablet by mouth daily.    . metFORMIN (GLUCOPHAGE) 500 MG tablet Take 1,000 mg by mouth 2 (two) times daily with a meal.    . oxybutynin (DITROPAN) 5 MG tablet Take 5 mg by mouth every 8 (eight) hours as needed (for urinary incontinence).     . pantoprazole (PROTONIX) 40 MG tablet Take 40  mg by mouth 2 (two) times daily.     . natalizumab (TYSABRI) 300 MG/15ML injection Inject 300 mg into the vein every 30 (thirty) days. Received at [facility] in [place]     No current facility-administered medications for this visit.    Facility-Administered Medications Ordered in Other Visits  Medication Dose Route Frequency Provider Last Rate Last Dose  . 0.9 %  sodium chloride infusion   Intravenous Once Jeralyn Ruths, MD      . acetaminophen (TYLENOL) tablet 1,000 mg  1,000 mg Oral Once Jeralyn Ruths, MD      . natalizumab (TYSABRI) 300 mg in sodium chloride 0.9 % 100 mL IVPB  300 mg Intravenous Once Jeralyn Ruths, MD      . natalizumab (TYSABRI) 300 mg in sodium chloride 0.9 % 100 mL IVPB  300 mg Intravenous Once Jeralyn Ruths, MD        OBJECTIVE: BP 135/87   Pulse 80   Temp (!) 97.4 F (36.3 C) (Tympanic)   Resp 20    There is no height or weight on file to calculate BMI.    ECOG FS:3 - Symptomatic, >50% confined to bed  General: Well-developed, well-nourished, no acute distress. Sitting in wheelchair. Eyes: Pink conjunctiva, anicteric sclera. Lungs: Clear to auscultation bilaterally. Heart: Regular rate and rhythm. No rubs, murmurs, or gallops. Abdomen: Soft, nontender, nondistended. No organomegaly noted, normoactive bowel sounds. Musculoskeletal: BLE weakness, right> left, No edema, cyanosis, or clubbing. Neuro: Alert, answering all questions appropriately. Cranial nerves are grossly intact. Skin: No rashes or petechiae noted. Psych: Normal affect.   LAB RESULTS:  STUDIES: No results found.  ASSESSMENT:  Multiple Sclerosis.  PLAN:   1. Multiple sclerosis: Patient reports he discussed treatment with his neurologist yesterday who recommended giving Tysabri every 6 weeks. We will schedule accordingly and obtain confirmation from Dr. Elwyn Reach. The patient was instructed that the decision about the treatment of multiple sclerosis, the regimen for  treatment, and management of side effects is up to the patient's neurologist. The patient understands and agrees to contact Dr. Elwyn Reach with any questions in this regard, though we are always available to assist with emergencies.  He will return to clinic every 6 weeks for Tysabri infusions and then in 6 months for further evaluation.    Approximately 30 minutes was spent in discussion of which greater than 50% was consultation.  Patient expressed understanding and was in agreement with this plan. He also understands that He can call his Neurology clinic at any time with any questions, concerns, or complaints.    Jeralyn Ruths, MD   03/18/2017 10:58 AM

## 2017-03-18 ENCOUNTER — Inpatient Hospital Stay: Payer: Medicare Other | Attending: Family Medicine | Admitting: Oncology

## 2017-03-18 ENCOUNTER — Inpatient Hospital Stay: Payer: Medicare Other

## 2017-03-18 VITALS — BP 135/87 | HR 80 | Temp 97.4°F | Resp 20

## 2017-03-18 VITALS — BP 144/80 | HR 88 | Resp 20

## 2017-03-18 DIAGNOSIS — E119 Type 2 diabetes mellitus without complications: Secondary | ICD-10-CM | POA: Diagnosis not present

## 2017-03-18 DIAGNOSIS — G4730 Sleep apnea, unspecified: Secondary | ICD-10-CM | POA: Diagnosis not present

## 2017-03-18 DIAGNOSIS — N319 Neuromuscular dysfunction of bladder, unspecified: Secondary | ICD-10-CM | POA: Insufficient documentation

## 2017-03-18 DIAGNOSIS — Z87891 Personal history of nicotine dependence: Secondary | ICD-10-CM

## 2017-03-18 DIAGNOSIS — I2510 Atherosclerotic heart disease of native coronary artery without angina pectoris: Secondary | ICD-10-CM | POA: Insufficient documentation

## 2017-03-18 DIAGNOSIS — I10 Essential (primary) hypertension: Secondary | ICD-10-CM | POA: Diagnosis not present

## 2017-03-18 DIAGNOSIS — Z7982 Long term (current) use of aspirin: Secondary | ICD-10-CM | POA: Insufficient documentation

## 2017-03-18 DIAGNOSIS — Z7984 Long term (current) use of oral hypoglycemic drugs: Secondary | ICD-10-CM | POA: Insufficient documentation

## 2017-03-18 DIAGNOSIS — K219 Gastro-esophageal reflux disease without esophagitis: Secondary | ICD-10-CM | POA: Insufficient documentation

## 2017-03-18 DIAGNOSIS — G35 Multiple sclerosis: Secondary | ICD-10-CM

## 2017-03-18 DIAGNOSIS — Z993 Dependence on wheelchair: Secondary | ICD-10-CM | POA: Insufficient documentation

## 2017-03-18 DIAGNOSIS — Z79899 Other long term (current) drug therapy: Secondary | ICD-10-CM | POA: Insufficient documentation

## 2017-03-18 DIAGNOSIS — R5383 Other fatigue: Secondary | ICD-10-CM | POA: Insufficient documentation

## 2017-03-18 DIAGNOSIS — R5381 Other malaise: Secondary | ICD-10-CM | POA: Diagnosis not present

## 2017-03-18 MED ORDER — SODIUM CHLORIDE 0.9 % IV SOLN
Freq: Once | INTRAVENOUS | Status: AC
  Administered 2017-03-18: 11:00:00 via INTRAVENOUS
  Filled 2017-03-18: qty 1000

## 2017-03-18 MED ORDER — SODIUM CHLORIDE 0.9 % IV SOLN
300.0000 mg | Freq: Once | INTRAVENOUS | Status: AC
  Administered 2017-03-18: 300 mg via INTRAVENOUS
  Filled 2017-03-18: qty 15

## 2017-03-18 MED ORDER — ACETAMINOPHEN 500 MG PO TABS
1000.0000 mg | ORAL_TABLET | Freq: Once | ORAL | Status: AC
  Administered 2017-03-18: 1000 mg via ORAL
  Filled 2017-03-18: qty 2

## 2017-03-18 NOTE — Patient Instructions (Signed)

## 2017-03-18 NOTE — Progress Notes (Signed)
Patient denies any concerns today.  

## 2017-04-28 MED ORDER — SODIUM CHLORIDE 0.9 % IV SOLN
300.0000 mg | Freq: Once | INTRAVENOUS | Status: AC
  Administered 2017-04-29: 300 mg via INTRAVENOUS
  Filled 2017-04-28: qty 15

## 2017-04-28 MED ORDER — ACETAMINOPHEN 500 MG PO TABS
1000.0000 mg | ORAL_TABLET | Freq: Once | ORAL | Status: AC
  Administered 2017-04-29: 1000 mg via ORAL

## 2017-04-28 MED ORDER — SODIUM CHLORIDE 0.9 % IV SOLN
Freq: Once | INTRAVENOUS | Status: AC
  Administered 2017-04-29: 11:00:00 via INTRAVENOUS
  Filled 2017-04-28: qty 1000

## 2017-04-29 ENCOUNTER — Inpatient Hospital Stay: Payer: Medicare Other | Attending: Oncology

## 2017-04-29 VITALS — BP 126/75 | HR 76 | Temp 97.0°F | Resp 18

## 2017-04-29 DIAGNOSIS — G35 Multiple sclerosis: Secondary | ICD-10-CM

## 2017-04-29 DIAGNOSIS — Z79899 Other long term (current) drug therapy: Secondary | ICD-10-CM | POA: Diagnosis not present

## 2017-04-29 MED ORDER — ACETAMINOPHEN 500 MG PO TABS
ORAL_TABLET | ORAL | Status: AC
  Filled 2017-04-29: qty 2

## 2017-06-10 ENCOUNTER — Ambulatory Visit: Payer: Medicare Other

## 2017-06-16 MED ORDER — SODIUM CHLORIDE 0.9 % IV SOLN
Freq: Once | INTRAVENOUS | Status: AC
Start: 2017-06-17 — End: 2017-06-17
  Administered 2017-06-17: 10:00:00 via INTRAVENOUS
  Filled 2017-06-16: qty 1000

## 2017-06-16 MED ORDER — ACETAMINOPHEN 500 MG PO TABS
1000.0000 mg | ORAL_TABLET | Freq: Once | ORAL | Status: AC
  Administered 2017-06-17: 1000 mg via ORAL

## 2017-06-16 MED ORDER — SODIUM CHLORIDE 0.9 % IV SOLN
300.0000 mg | Freq: Once | INTRAVENOUS | Status: AC
  Administered 2017-06-17: 300 mg via INTRAVENOUS
  Filled 2017-06-16: qty 15

## 2017-06-17 ENCOUNTER — Inpatient Hospital Stay: Payer: Medicare Other | Attending: Oncology

## 2017-06-17 VITALS — BP 110/73 | HR 85 | Temp 97.4°F | Resp 18

## 2017-06-17 DIAGNOSIS — Z79899 Other long term (current) drug therapy: Secondary | ICD-10-CM | POA: Insufficient documentation

## 2017-06-17 DIAGNOSIS — G35 Multiple sclerosis: Secondary | ICD-10-CM | POA: Diagnosis not present

## 2017-06-17 MED ORDER — ACETAMINOPHEN 500 MG PO TABS
ORAL_TABLET | ORAL | Status: AC
  Filled 2017-06-17: qty 2

## 2017-06-23 ENCOUNTER — Encounter: Payer: Self-pay | Admitting: Urology

## 2017-06-23 ENCOUNTER — Ambulatory Visit: Payer: Medicare Other | Admitting: Urology

## 2017-06-23 VITALS — BP 127/74 | HR 73 | Ht 69.0 in

## 2017-06-23 DIAGNOSIS — N319 Neuromuscular dysfunction of bladder, unspecified: Secondary | ICD-10-CM | POA: Diagnosis not present

## 2017-06-23 DIAGNOSIS — N390 Urinary tract infection, site not specified: Secondary | ICD-10-CM

## 2017-06-23 DIAGNOSIS — Z9359 Other cystostomy status: Secondary | ICD-10-CM

## 2017-06-23 NOTE — Progress Notes (Signed)
06/23/2017 3:36 PM   Jesus Sanchez [birth year] [id number]  Referring provider: Lauro Regulus, MD [address] [facility] [place], [place] [address]  Chief Complaint  Patient presents with  . New Patient (Initial Visit)    SPT problem    HPI: 66 year old male with a neurogenic bladder secondary to MS.  He has a chronic indwelling suprapubic tube.  He has a history of recurrent symptomatic UTIs.  He presents today with a 1-2-week history of increased suprapubic pain and bladder spasms.  He denies fever or chills.  His wife has noted purulent drainage from his penis.  His suprapubic tube was last changed on 11/26.   PMH: Past Medical History:  Diagnosis Date  . Complication of anesthesia    with sedation had to have narcan 1 month ago  . Coronary artery disease   . Depression   . Diabetes mellitus without complication (HCC)   . GERD (gastroesophageal reflux disease)   . Hypertension   . MS (multiple sclerosis) (HCC) 10/02/2015  . Neuromuscular disorder (HCC)    multiple sclerosis  . Sleep apnea    no cpap now being re evaluated    Surgical History: Past Surgical History:  Procedure Laterality Date  . CHOLECYSTECTOMY N/A 09/02/2016   Procedure: LAPAROSCOPIC CHOLECYSTECTOMY WITH CHOLANGIOGRAM;  Surgeon: Ricarda Frame, MD;  Location: [facility] ORS;  Service: General;  Laterality: N/A;  . CORONARY ANGIOPLASTY     stent 2000  . HERNIA REPAIR  2014   [facility]- Dr. Egbert Garibaldi  . IR GENERIC HISTORICAL  07/02/2016   IR PERC CHOLECYSTOSTOMY 07/02/2016 Richarda Overlie, MD [facility]INTER[facility] RAD    Home Medications:  Allergies as of 06/23/2017      Reactions   Contrast Media [iodinated Diagnostic Agents] Itching   Itching x 3 days   Oxycodone-acetaminophen Itching      Medication List        Accurate as of 06/23/17  3:36 PM. Always use your most recent med list.          aspirin EC 81 MG tablet Take 81 mg by mouth daily.   atorvastatin 10 MG tablet Commonly  known as:  LIPITOR Take 10 mg by mouth at bedtime.   baclofen 10 MG/5ML Soln injection Commonly known as:  LIORESAL 2,000 mcg by Intrathecal route every 6 (six) months. 7.2833 ug/h   citalopram 40 MG tablet Commonly known as:  CELEXA Take 40 mg by mouth daily.   gabapentin 300 MG capsule Commonly known as:  NEURONTIN Take 600-900 mg by mouth 2 (two) times daily. 900 mg at hs 600mg   in am   ibuprofen 200 MG tablet Commonly known as:  ADVIL,MOTRIN Take 400 mg by mouth every 8 (eight) hours as needed (for pain.).   losartan-hydrochlorothiazide 100-25 MG tablet Commonly known as:  HYZAAR Take 1 tablet by mouth daily.   metFORMIN 500 MG tablet Commonly known as:  GLUCOPHAGE Take 1,000 mg by mouth 2 (two) times daily with a meal.   oxybutynin 5 MG tablet Commonly known as:  DITROPAN Take 5 mg by mouth every 8 (eight) hours as needed (for urinary incontinence).   pantoprazole 40 MG tablet Commonly known as:  PROTONIX Take 40 mg by mouth 2 (two) times daily.   TYSABRI 300 MG/15ML injection Generic drug:  natalizumab Inject 300 mg into the vein every 30 (thirty) days. Received at [facility] in [place]       Allergies:  Allergies  Allergen Reactions  . Contrast  Media [Iodinated Diagnostic Agents] Itching    Itching x 3 days  . Oxycodone-Acetaminophen Itching    Family History: Family History  Problem Relation Age of Onset  . Ulcerative colitis Mother   . Diabetes Mother   . Diabetes Sister   . Colon polyps Sister   . Prostate cancer Neg Hx   . Bladder Cancer Neg Hx   . Kidney cancer Neg Hx     Social History:  reports that he quit smoking about 38 years ago. He has a 50.00 pack-year smoking history. He quit smokeless tobacco use about 38 years ago. He reports that he does not drink alcohol or use drugs.  ROS: UROLOGY Frequent Urination?: No Hard to postpone urination?: No Burning/pain with urination?: No Get up at night to urinate?: No Leakage of urine?:  No Urine stream starts and stops?: No Trouble starting stream?: No Do you have to strain to urinate?: No Blood in urine?: No Urinary tract infection?: Yes Sexually transmitted disease?: No Injury to kidneys or bladder?: No Painful intercourse?: No Weak stream?: No Erection problems?: No Penile pain?: No  Gastrointestinal Nausea?: No Vomiting?: No Indigestion/heartburn?: No Diarrhea?: No Constipation?: No  Constitutional Fever: No Night sweats?: No Weight loss?: No Fatigue?: No  Skin Skin rash/lesions?: No Itching?: No  Eyes Blurred vision?: No Double vision?: No  Ears/Nose/Throat Sore throat?: No Sinus problems?: No  Hematologic/Lymphatic Swollen glands?: No Easy bruising?: No  Cardiovascular Leg swelling?: No Chest pain?: No  Respiratory Cough?: No Shortness of breath?: No  Endocrine Excessive thirst?: No  Musculoskeletal Back pain?: No Joint pain?: No  Neurological Headaches?: No Dizziness?: No  Psychologic Depression?: No Anxiety?: No  Physical Exam: BP 127/74 (BP Location: Left Arm, Patient Position: Sitting, Cuff Size: Normal)   Pulse 73   Ht 5\' 9"  (1.753 m)   BMI 30.72 kg/m   Constitutional:  Alert and oriented, No acute distress. HEENT: [place] AT, moist mucus membranes.  Trachea midline, no masses. Cardiovascular: No clubbing, cyanosis, or edema. Respiratory: Normal respiratory effort, no increased work of breathing. GI: Abdomen is soft, nontender, nondistended, no abdominal masses.  Suprapubic site without erythema, tenderness or fluctuance GU: No CVA tenderness.  No scrotal erythema or tenderness.  Penis without erythema or drainage.   Skin: No rashes, bruises or suspicious lesions. Lymph: No cervical or inguinal adenopathy. Neurologic: Grossly intact, no focal deficits, moving all 4 extremities. Psychiatric: Normal mood and affect.  Laboratory Data: Lab Results  Component Value Date   WBC 11.2 (H) 09/03/2016   HGB 14.6  09/03/2016   HCT 41.4 09/03/2016   MCV 94.0 09/03/2016   PLT 172 09/03/2016    Lab Results  Component Value Date   CREATININE 0.71 09/03/2016    Lab Results  Component Value Date   HGBA1C 6.7 (H) 07/02/2016     Assessment & Plan:   Current symptoms are typical of his UTIs and resolved with antibiotic therapy.  His suprapubic tube was clamped and was sent for culture.  His wife states they have Cipro on hand and will start pending the culture report.     1. Recurrent UTI   2. Chronic suprapubic catheter (HCC)   3. Neurogenic bladder    Riki AltesScott C Stoioff, MD  [facility] [address], [address] [place], [place] [address] [phone]

## 2017-06-26 LAB — CULTURE, URINE COMPREHENSIVE

## 2017-06-29 ENCOUNTER — Other Ambulatory Visit: Payer: Self-pay | Admitting: Urology

## 2017-06-29 ENCOUNTER — Telehealth: Payer: Self-pay

## 2017-06-29 MED ORDER — NITROFURANTOIN MONOHYD MACRO 100 MG PO CAPS: 100.0000 mg | ORAL_CAPSULE | Freq: Two times a day (BID) | ORAL | 0 refills | Status: AC

## 2017-06-29 NOTE — Telephone Encounter (Signed)
-----   Message from Riki Altes, MD sent at 06/29/2017  7:31 AM EST ----- Urine culture was positive for infection.  It was resistant to Cipro.  Discontinue Cipro.  Antibiotic Rx was sent to pharmacy.

## 2017-06-29 NOTE — Telephone Encounter (Signed)
Spoke with pt wife in reference to +ucx and d/c cipro. Made aware macrobid was sent to pharmacy. Wife voiced understanding.

## 2017-07-22 ENCOUNTER — Ambulatory Visit: Payer: Medicare Other

## 2017-07-29 ENCOUNTER — Inpatient Hospital Stay: Payer: Medicare Other | Attending: Oncology

## 2017-07-29 VITALS — BP 126/78 | HR 85 | Temp 96.4°F | Resp 18

## 2017-07-29 DIAGNOSIS — G35 Multiple sclerosis: Secondary | ICD-10-CM | POA: Insufficient documentation

## 2017-07-29 MED ORDER — SODIUM CHLORIDE 0.9 % IV SOLN
Freq: Once | INTRAVENOUS | Status: AC
  Administered 2017-07-29: 11:00:00 via INTRAVENOUS
  Filled 2017-07-29: qty 1000

## 2017-07-29 MED ORDER — SODIUM CHLORIDE 0.9 % IV SOLN
300.0000 mg | Freq: Once | INTRAVENOUS | Status: AC
  Administered 2017-07-29: 300 mg via INTRAVENOUS
  Filled 2017-07-29: qty 15

## 2017-07-29 MED ORDER — ACETAMINOPHEN 500 MG PO TABS
1000.0000 mg | ORAL_TABLET | Freq: Once | ORAL | Status: AC
  Administered 2017-07-29: 1000 mg via ORAL
  Filled 2017-07-29: qty 2

## 2017-09-02 ENCOUNTER — Ambulatory Visit: Payer: Medicare Other | Admitting: Oncology

## 2017-09-02 ENCOUNTER — Ambulatory Visit: Payer: Medicare Other

## 2017-09-04 NOTE — Progress Notes (Deleted)
[facility]  [phone]  [phone]     Jesus Sanchez DOB: [birth year]  MR#: [id number]  AOZ#:[id number]  Patient Care Team: Lauro Regulus, MD as PCP - General (Internal Medicine)  CHIEF COMPLAINT:  Multiple Sclerosis  INTERVAL HISTORY:  Patient returns to clinic today for routine 6 month evaluation. He continues to [facility] for treatment of MS. He currently feels well and is at his baseline. He is confined to a motorized wheelchair and has very limited use of lower extremities. He is capable of transferring but is unable to bear any weight. He has a Baclofen pump in place. He also has a suprapubic catheter in place for neurogenic bladder. Patient offers no further specific complaints today.  REVIEW OF SYSTEMS:   Review of Systems  Constitutional: Positive for malaise/fatigue. Negative for fever and weight loss.  Respiratory: Negative.  Negative for cough and shortness of breath.   Cardiovascular: Negative.  Negative for chest pain and leg swelling.  Gastrointestinal: Negative.  Negative for abdominal pain.  Genitourinary: Negative.   Musculoskeletal: Negative.   Skin: Negative.  Negative for rash.  Neurological: Positive for focal weakness and weakness. Negative for tingling and sensory change.  Psychiatric/Behavioral: Negative.  The patient is not nervous/anxious.     As per HPI. Otherwise, a complete review of systems is negative.   PAST MEDICAL HISTORY: Past Medical History:  Diagnosis Date  . Complication of anesthesia    with sedation had to have narcan 1 month ago  . Coronary artery disease   . Depression   . Diabetes mellitus without complication (HCC)   . GERD (gastroesophageal reflux disease)   . Hypertension   . MS (multiple sclerosis) (HCC) 10/02/2015  . Neuromuscular disorder (HCC)    multiple sclerosis  . Sleep apnea    no cpap now being re evaluated    PAST SURGICAL HISTORY: Past Surgical History:    Procedure Laterality Date  . CHOLECYSTECTOMY N/A 09/02/2016   Procedure: LAPAROSCOPIC CHOLECYSTECTOMY WITH CHOLANGIOGRAM;  Surgeon: Ricarda Frame, MD;  Location: [facility] ORS;  Service: General;  Laterality: N/A;  . CORONARY ANGIOPLASTY     stent 2000  . HERNIA REPAIR  2014   [facility]- Dr. Egbert Garibaldi  . IR GENERIC HISTORICAL  07/02/2016   IR PERC CHOLECYSTOSTOMY 07/02/2016 Richarda Overlie, MD [facility]INTER[facility] RAD    FAMILY HISTORY: Reviewed and unchanged. No reported history of malignancy or chronic disease.  GYNECOLOGIC HISTORY:  No LMP for male patient.     ADVANCED DIRECTIVES:    HEALTH MAINTENANCE: Social History   Tobacco Use  . Smoking status: Former Smoker    Packs/day: 2.00    Years: 25.00    Pack years: 50.00    Last attempt to quit: 08/24/1978    Years since quitting: 39.0  . Smokeless tobacco: Former Neurosurgeon    Quit date: 05/20/1979  Substance Use Topics  . Alcohol use: No  . Drug use: No    Allergies  Allergen Reactions  . Contrast Media [Iodinated Diagnostic Agents] Itching    Itching x 3 days  . Oxycodone-Acetaminophen Itching    Current Outpatient Medications  Medication Sig Dispense Refill  . aspirin EC 81 MG tablet Take 81 mg by mouth daily.     Marland Kitchen atorvastatin (LIPITOR) 10 MG tablet Take 10 mg by mouth at bedtime.     . baclofen (LIORESAL) 10 MG/5ML SOLN injection 2,000 mcg by Intrathecal route every 6 (six) months. 9.6295 ug/h    . citalopram (CELEXA) 40  MG tablet Take 40 mg by mouth daily.     Marland Kitchen gabapentin (NEURONTIN) 300 MG capsule Take 600-900 mg by mouth 2 (two) times daily. 900 mg at hs 600mg   in am    . ibuprofen (ADVIL,MOTRIN) 200 MG tablet Take 400 mg by mouth every 8 (eight) hours as needed (for pain.).    Marland Kitchen losartan-hydrochlorothiazide (HYZAAR) 100-25 MG tablet Take 1 tablet by mouth daily.    . metFORMIN (GLUCOPHAGE) 500 MG tablet Take 1,000 mg by mouth 2 (two) times daily with a meal.    . natalizumab (TYSABRI) 300 MG/15ML injection Inject 300 mg into the  vein every 30 (thirty) days. Received at [facility] in [place]    . oxybutynin (DITROPAN) 5 MG tablet Take 5 mg by mouth every 8 (eight) hours as needed (for urinary incontinence).     . pantoprazole (PROTONIX) 40 MG tablet Take 40 mg by mouth 2 (two) times daily.      No current facility-administered medications for this visit.     OBJECTIVE: There were no vitals taken for this visit.   There is no height or weight on file to calculate BMI.    ECOG FS:3 - Symptomatic, >50% confined to bed  General: Well-developed, well-nourished, no acute distress. Sitting in wheelchair. Eyes: Pink conjunctiva, anicteric sclera. Lungs: Clear to auscultation bilaterally. Heart: Regular rate and rhythm. No rubs, murmurs, or gallops. Abdomen: Soft, nontender, nondistended. No organomegaly noted, normoactive bowel sounds. Musculoskeletal: BLE weakness, right> left, No edema, cyanosis, or clubbing. Neuro: Alert, answering all questions appropriately. Cranial nerves are grossly intact. Skin: No rashes or petechiae noted. Psych: Normal affect.   LAB RESULTS:  STUDIES: No results found.  ASSESSMENT:  Multiple Sclerosis.  PLAN:   1. Multiple sclerosis: Patient reports he discussed treatment with his neurologist yesterday who recommended giving Tysabri every 6 weeks. We will schedule accordingly and obtain confirmation from Dr. Elwyn Reach. The patient was instructed that the decision about the treatment of multiple sclerosis, the regimen for treatment, and management of side effects is up to the patient's neurologist. The patient understands and agrees to contact Dr. Elwyn Reach with any questions in this regard, though we are always available to assist with emergencies.  He will return to clinic every 6 weeks for Tysabri infusions and then in 6 months for further evaluation.    Approximately 30 minutes was spent in discussion of which greater than 50% was consultation.  Patient expressed understanding and was in  agreement with this plan. He also understands that He can call his Neurology clinic at any time with any questions, concerns, or complaints.    Jeralyn Ruths, MD   09/04/2017 11:31 AM

## 2017-09-09 ENCOUNTER — Inpatient Hospital Stay: Payer: Medicare Other | Attending: Oncology | Admitting: Oncology

## 2017-09-09 ENCOUNTER — Encounter: Payer: Self-pay | Admitting: Oncology

## 2017-09-09 ENCOUNTER — Inpatient Hospital Stay: Payer: Medicare Other

## 2017-09-09 ENCOUNTER — Ambulatory Visit: Payer: Medicare Other | Admitting: Oncology

## 2017-09-09 ENCOUNTER — Ambulatory Visit: Payer: Medicare Other

## 2017-09-09 VITALS — BP 114/78 | HR 76 | Resp 18

## 2017-09-09 VITALS — BP 112/74 | HR 79 | Temp 97.4°F | Resp 18

## 2017-09-09 DIAGNOSIS — I10 Essential (primary) hypertension: Secondary | ICD-10-CM | POA: Diagnosis not present

## 2017-09-09 DIAGNOSIS — G35 Multiple sclerosis: Secondary | ICD-10-CM

## 2017-09-09 DIAGNOSIS — E119 Type 2 diabetes mellitus without complications: Secondary | ICD-10-CM

## 2017-09-09 MED ORDER — SODIUM CHLORIDE 0.9 % IV SOLN
Freq: Once | INTRAVENOUS | Status: AC
  Administered 2017-09-09: 11:00:00 via INTRAVENOUS
  Filled 2017-09-09: qty 1000

## 2017-09-09 MED ORDER — SODIUM CHLORIDE 0.9 % IV SOLN
300.0000 mg | Freq: Once | INTRAVENOUS | Status: AC
  Administered 2017-09-09: 300 mg via INTRAVENOUS
  Filled 2017-09-09 (×2): qty 15

## 2017-09-09 MED ORDER — ACETAMINOPHEN 500 MG PO TABS
1000.0000 mg | ORAL_TABLET | Freq: Once | ORAL | Status: AC
  Administered 2017-09-09: 1000 mg via ORAL
  Filled 2017-09-09: qty 2

## 2017-09-09 NOTE — Patient Instructions (Signed)

## 2017-09-09 NOTE — Progress Notes (Signed)
[facility]  Telephone:([phone]  Fax:([phone]     Jesus Sanchez DOB: 08-16-1950  MR#: [id number]  KGU#:[id number]  Patient Care Team: Lauro Regulus, MD as PCP - General (Internal Medicine)  CHIEF COMPLAINT:  Multiple Sclerosis  INTERVAL HISTORY:  Patient returns to clinic today for routine 6 month evaluation. He continues to tolerate Tysabri well without significant side effects.  He has no new neurologic complaints. He currently feels well and is at his baseline. He is confined to a motorized wheelchair and has very limited use of lower extremities. He is capable of transferring but is unable to bear any weight. He has a Baclofen pump in place. He also has a suprapubic catheter in place for neurogenic bladder.  He denies any recent fevers or illnesses.  Patient offers no further specific complaints today.  REVIEW OF SYSTEMS:   Review of Systems  Constitutional: Positive for malaise/fatigue. Negative for fever and weight loss.  Respiratory: Negative.  Negative for cough and shortness of breath.   Cardiovascular: Negative.  Negative for chest pain and leg swelling.  Gastrointestinal: Negative.  Negative for abdominal pain.  Genitourinary: Negative.   Musculoskeletal: Negative.   Skin: Negative.  Negative for rash.  Neurological: Positive for focal weakness and weakness. Negative for tingling and sensory change.  Psychiatric/Behavioral: Negative.  The patient is not nervous/anxious.     As per HPI. Otherwise, a complete review of systems is negative.   PAST MEDICAL HISTORY: Past Medical History:  Diagnosis Date  . Complication of anesthesia    with sedation had to have narcan 1 month ago  . Coronary artery disease   . Depression   . Diabetes mellitus without complication (HCC)   . GERD (gastroesophageal reflux disease)   . Hypertension   . MS (multiple sclerosis) (HCC) 10/02/2015  . Neuromuscular disorder (HCC)    multiple sclerosis  . Sleep  apnea    no cpap now being re evaluated    PAST SURGICAL HISTORY: Past Surgical History:  Procedure Laterality Date  . CHOLECYSTECTOMY N/A 09/02/2016   Procedure: LAPAROSCOPIC CHOLECYSTECTOMY WITH CHOLANGIOGRAM;  Surgeon: Ricarda Frame, MD;  Location: [facility] ORS;  Service: General;  Laterality: N/A;  . CORONARY ANGIOPLASTY     stent 2000  . HERNIA REPAIR  2014   [facility]- Dr. Egbert Garibaldi  . IR GENERIC HISTORICAL  07/02/2016   IR PERC CHOLECYSTOSTOMY 07/02/2016 Richarda Overlie, MD [facility]    FAMILY HISTORY: Reviewed and unchanged. No reported history of malignancy or chronic disease.  GYNECOLOGIC HISTORY:  No LMP for male patient.     ADVANCED DIRECTIVES:    HEALTH MAINTENANCE: Social History   Tobacco Use  . Smoking status: Former Smoker    Packs/day: 2.00    Years: 25.00    Pack years: 50.00    Last attempt to quit: 08/24/1978    Years since quitting: 39.0  . Smokeless tobacco: Former Neurosurgeon    Quit date: 05/20/1979  Substance Use Topics  . Alcohol use: No  . Drug use: No    Allergies  Allergen Reactions  . Contrast Media [Iodinated Diagnostic Agents] Itching    Itching x 3 days  . Oxycodone-Acetaminophen Itching    Current Outpatient Medications  Medication Sig Dispense Refill  . natalizumab (TYSABRI) 300 MG/15ML injection Inject 300 mg into the vein every 30 (thirty) days. Received at [facility] in [place]    . aspirin EC 81 MG tablet Take 81 mg by mouth daily.     Marland Kitchen  atorvastatin (LIPITOR) 10 MG tablet Take 10 mg by mouth at bedtime.     . baclofen (LIORESAL) 10 MG/5ML SOLN injection 2,000 mcg by Intrathecal route every 6 (six) months. 1.6109 ug/h    . citalopram (CELEXA) 40 MG tablet Take 40 mg by mouth daily.     Marland Kitchen gabapentin (NEURONTIN) 300 MG capsule Take 600-900 mg by mouth 2 (two) times daily. 900 mg at hs 600mg   in am    . ibuprofen (ADVIL,MOTRIN) 200 MG tablet Take 400 mg by mouth every 8 (eight) hours as needed (for pain.).    Marland Kitchen losartan-hydrochlorothiazide  (HYZAAR) 100-25 MG tablet Take 1 tablet by mouth daily.    . metFORMIN (GLUCOPHAGE) 500 MG tablet Take 1,000 mg by mouth 2 (two) times daily with a meal.    . oxybutynin (DITROPAN) 5 MG tablet Take 5 mg by mouth every 8 (eight) hours as needed (for urinary incontinence).     . pantoprazole (PROTONIX) 40 MG tablet Take 40 mg by mouth 2 (two) times daily.      No current facility-administered medications for this visit.    Facility-Administered Medications Ordered in Other Visits  Medication Dose Route Frequency Provider Last Rate Last Dose  . natalizumab (TYSABRI) 300 mg in sodium chloride 0.9 % 100 mL IVPB  300 mg Intravenous Once Jeralyn Ruths, MD 115 mL/hr at 09/09/17 1108 300 mg at 09/09/17 1108    OBJECTIVE: BP 112/74 (BP Location: Right Arm, Patient Position: Sitting)   Pulse 79   Temp (!) 97.4 F (36.3 C) (Tympanic)   Resp 18    There is no height or weight on file to calculate BMI.    ECOG FS:3 - Symptomatic, >50% confined to bed  General: Well-developed, well-nourished, no acute distress. Sitting in wheelchair. Eyes: Pink conjunctiva, anicteric sclera. Lungs: Clear to auscultation bilaterally. Heart: Regular rate and rhythm. No rubs, murmurs, or gallops. Abdomen: Soft, nontender, nondistended. No organomegaly noted, normoactive bowel sounds. Musculoskeletal: BLE weakness, right> left, No edema, cyanosis, or clubbing. Neuro: Alert, answering all questions appropriately. Cranial nerves are grossly intact. Skin: No rashes or petechiae noted. Psych: Normal affect.   LAB RESULTS:  STUDIES: No results found.  ASSESSMENT:  Multiple Sclerosis.  PLAN:   1. Multiple sclerosis: Continue Tysabri every 6 weeks as per his primary neurologist. The patient was instructed that the decision about the treatment of multiple sclerosis, the regimen for treatment, and management of side effects is up to the patient's neurologist. The patient understands and agrees to contact Dr. Elwyn Reach  with any questions in this regard, though we are always available to assist with emergencies.  He will return to clinic every 6 weeks for Tysabri infusions and then in 6 months for further evaluation.  Patient has indicated today he may transfer his infusions back to [facility].  Approximately 30 minutes was spent in discussion of which greater than 50% was consultation.  Patient expressed understanding and was in agreement with this plan. He also understands that He can call his Neurology clinic at any time with any questions, concerns, or complaints.    Jeralyn Ruths, MD   09/09/2017 11:46 AM

## 2017-10-21 ENCOUNTER — Ambulatory Visit: Payer: Medicare Other

## 2017-11-29 ENCOUNTER — Ambulatory Visit (INDEPENDENT_AMBULATORY_CARE_PROVIDER_SITE_OTHER): Payer: Medicare Other | Admitting: Urology

## 2017-11-29 ENCOUNTER — Telehealth: Payer: Self-pay | Admitting: Urology

## 2017-11-29 DIAGNOSIS — N319 Neuromuscular dysfunction of bladder, unspecified: Secondary | ICD-10-CM

## 2017-12-01 ENCOUNTER — Encounter: Payer: Self-pay | Admitting: Urology

## 2017-12-01 NOTE — Progress Notes (Signed)
Diagnosis: Inability to exchange suprapubic catheter  Procedure: Cystoscopy with suprapubic tube exchange; complicated   Description: A flexible cystoscope was placed through the suprapubic tube tract and approximately 1 cm in a narrow opening was noted.  A 0.038 guidewire was placed through the cystoscope and through this opening with advancement.  Patient noted sensation of bladder irritation with advancement of this wire.  Attempts at placing 16 and 14 [place] council catheter is over the wire met with resistance.  The suprapubic tube tract was then dilated with over-the-wire urethral dilators starting at 12 [place] to 19 [place].  A 16 [place] council catheter still met with resistance however a 14 [place] council catheter was placed without difficulty.  Plan: Follow-up 1 month for suprapubic tube catheter exchange over a guidewire.  He received Cipro 500 mg p.o.

## 2017-12-01 NOTE — Progress Notes (Signed)
67 year old male with multiple sclerosis and a neurogenic bladder managed with a chronic indwelling suprapubic tube.  His catheter is changed monthly by home health however today after treatment we will a 16 French catheter was unable to be placed through the tract.  Exam: SP tube site is clean and dry.  Attempts at placing a 16 and 14 French catheter through the tract met with resistance.  Plan: SP tube placement under cystoscopic guidance.

## 2017-12-02 ENCOUNTER — Ambulatory Visit: Payer: Medicare Other

## 2017-12-05 ENCOUNTER — Telehealth: Payer: Self-pay

## 2017-12-05 NOTE — Telephone Encounter (Signed)
Patient's wife called stating that patient is having terrible bladder spasms starting yesterday. Urine is coming out around the tube and from his penis. There is urine still draining into his bag and he is taking the ditropan. Patient's wife is really worried about his leakage due to his bed sores on his bottom , she is having a hard time keeping him dry and she does not want these to worsen. Can patient try a different medication to help spasms? Please advise thanks

## 2017-12-05 NOTE — Telephone Encounter (Signed)
Spoke with patient's wife who states urine is now not draining into bag. Per Dr. Lonna Cobb ok to come by office and pick up Myrbetriq 50mg  samples to see if this helps with spasms. It was offered to give an order to home health for PRN irrigation to make sure catheter is draining. Patient's wife states that she knows how to irrigate but does not have supplies. Supplies were left up front with samples.

## 2017-12-06 NOTE — Telephone Encounter (Signed)
Noted  

## 2018-01-04 ENCOUNTER — Ambulatory Visit: Payer: Medicare Other | Admitting: Urology

## 2018-01-04 ENCOUNTER — Encounter: Payer: Self-pay | Admitting: Urology

## 2018-01-04 VITALS — BP 134/79 | HR 72

## 2018-01-04 DIAGNOSIS — N319 Neuromuscular dysfunction of bladder, unspecified: Secondary | ICD-10-CM | POA: Diagnosis not present

## 2018-01-04 NOTE — Progress Notes (Signed)
Refer to my previous note of 11/29/2017.  He presents today for upsizing of his suprapubic tube over a wire.  He has had no problems with the 14 French catheter.  His lower abdomen was prepped and draped.  A 0.038 Sensor wire was placed through the council catheter and advanced into the bladder.  The 14 French catheter was removed after deflating the balloon.  A 16 [place] council catheter was placed over the wire without difficulty and 10 mL of sterile water was placed in the balloon.  The catheter was placed to gravity drainage.  Follow-up 1 month for suprapubic tube change and will attempt changing without a wire.

## 2018-01-13 ENCOUNTER — Ambulatory Visit: Payer: Medicare Other

## 2018-01-26 ENCOUNTER — Telehealth: Payer: Self-pay | Admitting: Urology

## 2018-01-26 NOTE — Telephone Encounter (Signed)
eror

## 2018-02-13 ENCOUNTER — Ambulatory Visit (INDEPENDENT_AMBULATORY_CARE_PROVIDER_SITE_OTHER): Payer: Medicare Other | Admitting: Urology

## 2018-02-13 ENCOUNTER — Encounter: Payer: Self-pay | Admitting: Urology

## 2018-02-13 VITALS — BP 137/83 | HR 90

## 2018-02-13 DIAGNOSIS — N319 Neuromuscular dysfunction of bladder, unspecified: Secondary | ICD-10-CM

## 2018-02-13 NOTE — Progress Notes (Signed)
67 year old male with multiple sclerosis and a neurogenic bladder.  He has a history of difficult suprapubic catheter changes.  His catheter was last changed on 01/04/2018 and was a [place] catheter changed over wire and upsized to 16 [place].  He presents today for catheter change.  His abdomen was prepped and draped.  The 58 French catheter was removed and replaced with a 16 French catheter without difficulty.  The balloon was inflated with 10 mL of sterile water.  Plan: Since today's change went well will start back on monthly catheter changes with home health.    Follow-up with me in 6 months.

## 2018-02-22 ENCOUNTER — Ambulatory Visit: Payer: Medicare Other | Admitting: Urology

## 2018-02-24 ENCOUNTER — Ambulatory Visit: Payer: Medicare Other | Admitting: Oncology

## 2018-02-24 ENCOUNTER — Ambulatory Visit: Payer: Medicare Other

## 2018-02-28 ENCOUNTER — Telehealth: Payer: Self-pay | Admitting: Urology

## 2018-02-28 NOTE — Telephone Encounter (Signed)
Cheryl calling from [facility] needs orders for catheter care for pt SPT.Marland Kitchen please call Elnita Maxwell at [phone]. Please advise. Thank you.

## 2018-03-02 NOTE — Telephone Encounter (Signed)
Spoke with Elnita Maxwell and gave a verbal order for monthly SP tube change 16 fr

## 2018-03-31 DIAGNOSIS — A498 Other bacterial infections of unspecified site: Secondary | ICD-10-CM | POA: Insufficient documentation

## 2018-08-16 ENCOUNTER — Ambulatory Visit: Payer: Medicare Other | Admitting: Urology

## 2018-10-10 NOTE — Telephone Encounter (Signed)
ERROR

## 2019-01-10 DIAGNOSIS — T85610A Breakdown (mechanical) of epidural and subdural infusion catheter, initial encounter: Secondary | ICD-10-CM | POA: Insufficient documentation

## 2019-02-05 DIAGNOSIS — Z993 Dependence on wheelchair: Secondary | ICD-10-CM | POA: Insufficient documentation

## 2019-08-17 ENCOUNTER — Other Ambulatory Visit: Payer: Self-pay

## 2019-08-17 ENCOUNTER — Encounter: Payer: Self-pay | Admitting: Urology

## 2019-08-17 ENCOUNTER — Ambulatory Visit: Payer: Medicare Other | Admitting: Urology

## 2019-08-17 VITALS — BP 175/91 | HR 64

## 2019-08-17 DIAGNOSIS — N390 Urinary tract infection, site not specified: Secondary | ICD-10-CM | POA: Diagnosis not present

## 2019-08-17 DIAGNOSIS — N319 Neuromuscular dysfunction of bladder, unspecified: Secondary | ICD-10-CM

## 2019-08-17 NOTE — Progress Notes (Signed)
08/17/2019 10:25 AM   Jesus Sanchez 1951-05-14 [id number]  Referring provider: Kirk Ruths, MD [facility] [place],   [id number]  Chief Complaint  Patient presents with  . Hospitalization Follow-up    HPI: 69 y.o. male with multiple sclerosis and a neurogenic bladder.  He has a previous history of difficult suprapubic catheter changes.  His wife states he has been hospitalized with UTIs X 4 the last year. Since May 2020 he has had positive cultures for Proteus, Morganella, Pseudomonas and E. coli.  CT abdomen pelvis performed May 2020 showed no renal calculi or hydronephrosis.  UTI symptoms have included fever and sepsis.  His wife states he is producing much more sediment which will clog his 7 French catheter and has been unable to irrigate at times.  He will occasionally void through penis involuntarily when his catheter is not draining.  PMH: Past Medical History:  Diagnosis Date  . Complication of anesthesia    with sedation had to have narcan 1 month ago  . Coronary artery disease   . Depression   . Diabetes mellitus without complication ([place])   . GERD (gastroesophageal reflux disease)   . Hypertension   . MS (multiple sclerosis) ([place]) 10/02/2015  . Neuromuscular disorder ([place])    multiple sclerosis  . Sleep apnea    no cpap now being re evaluated    Surgical History: Past Surgical History:  Procedure Laterality Date  . CHOLECYSTECTOMY N/A 09/02/2016   Procedure: LAPAROSCOPIC CHOLECYSTECTOMY WITH CHOLANGIOGRAM;  Surgeon: Clayburn Pert, MD;  Location: [facility] ORS;  Service: General;  Laterality: N/A;  . CORONARY ANGIOPLASTY     stent 2000  . HERNIA REPAIR  2014   [facility]- Dr. Marina Gravel  . IR GENERIC HISTORICAL  07/02/2016   IR PERC CHOLECYSTOSTOMY 07/02/2016 Markus Daft, MD [facility]-INTERV RAD    Home Medications:  Allergies as of 08/17/2019      Reactions   Iodinated Diagnostic Agents Itching   Itching x 3 days   Oxycodone-acetaminophen Itching      Medication List       Accurate as of August 17, 2019 10:25 AM. If you have any questions, ask your nurse or doctor.        STOP taking these medications   baclofen 10 MG/5ML Soln injection Commonly known as: LIORESAL Stopped by: Abbie Sons, MD   chlorhexidine 0.12 % solution Commonly known as: PERIDEX Stopped by: Abbie Sons, MD   glimepiride 2 MG tablet Commonly known as: AMARYL Stopped by: Abbie Sons, MD   lidocaine 2 % solution Commonly known as: XYLOCAINE Stopped by: Abbie Sons, MD     TAKE these medications   aspirin EC 81 MG tablet Take 81 mg by mouth daily.   atorvastatin 10 MG tablet Commonly known as: LIPITOR Take 10 mg by mouth at bedtime.   citalopram 40 MG tablet Commonly known as: CELEXA Take 40 mg by mouth daily.   ertapenem 1 g injection Commonly known as: INVANZ   furosemide 20 MG tablet Commonly known as: LASIX Take 20 mg by mouth daily.   gabapentin 300 MG capsule Commonly known as: NEURONTIN Take 600-900 mg by mouth 2 (two) times daily. 900 mg at hs 621m  in am   ibuprofen 200 MG tablet Commonly known as: ADVIL Take 400 mg by mouth every 8 (eight) hours as needed (for pain.).   losartan 50 MG tablet Commonly known as: COZAAR Take 50 mg  by mouth daily.   losartan-hydrochlorothiazide 100-25 MG tablet Commonly known as: HYZAAR Take 1 tablet by mouth daily.   meloxicam 15 MG tablet Commonly known as: MOBIC Take 15 mg by mouth daily.   metFORMIN 500 MG tablet Commonly known as: GLUCOPHAGE Take 1,000 mg by mouth 2 (two) times daily with a meal.   ONE TOUCH ULTRA 2 w/Device Kit See admin instructions.   ONE TOUCH ULTRA TEST test strip Generic drug: glucose blood USE 2 (TWO) TIMES DAILY USE AS INSTRUCTED.   OneTouch Delica Lancets 41O Misc USE 1 EACH 2 (TWO) TIMES DAILY USE AS INSTRUCTED.   Lancets Misc Use 1 each 2 (two) times daily Use as instructed. ONE TOUCH Dx  E11.8   oxybutynin 5 MG tablet Commonly known as: DITROPAN Take 5 mg by mouth every 8 (eight) hours as needed (for urinary incontinence).   pantoprazole 40 MG tablet Commonly known as: PROTONIX Take 40 mg by mouth 2 (two) times daily.   Tysabri 300 MG/15ML injection Generic drug: natalizumab Inject 300 mg into the vein every 30 (thirty) days. Received at [facility] in [place]:  Allergies  Allergen Reactions  . Iodinated Diagnostic Agents Itching    Itching x 3 days  . Oxycodone-Acetaminophen Itching    Family History: Family History  Problem Relation Age of Onset  . Ulcerative colitis Mother   . Diabetes Mother   . Diabetes Sister   . Colon polyps Sister   . Prostate cancer Neg Hx   . Bladder Cancer Neg Hx   . Kidney cancer Neg Hx     Social History:  reports that he quit smoking about 41 years ago. He has a 50.00 pack-year smoking history. He quit smokeless tobacco use about 40 years ago. He reports that he does not drink alcohol or use drugs.  ROS: UROLOGY Frequent Urination?: No Hard to postpone urination?: No Burning/pain with urination?: No Get up at night to urinate?: No Leakage of urine?: No Urine stream starts and stops?: No Trouble starting stream?: No Do you have to strain to urinate?: No Blood in urine?: No Urinary tract infection?: Yes Sexually transmitted disease?: No Injury to kidneys or bladder?: No Painful intercourse?: No Weak stream?: No Erection problems?: No Penile pain?: No  Gastrointestinal Nausea?: No Vomiting?: No Indigestion/heartburn?: No Diarrhea?: No Constipation?: No  Constitutional Fever: No Night sweats?: No Weight loss?: No Fatigue?: No  Skin Skin rash/lesions?: No Itching?: No  Eyes Blurred vision?: No Double vision?: No  Ears/Nose/Throat Sore throat?: No Sinus problems?: No  Hematologic/Lymphatic Swollen glands?: No Easy bruising?: No  Cardiovascular Leg swelling?: No Chest  pain?: No  Respiratory Cough?: No Shortness of breath?: No  Endocrine Excessive thirst?: No  Musculoskeletal Back pain?: No Joint pain?: No  Neurological Headaches?: No Dizziness?: No  Psychologic Depression?: No Anxiety?: No  Physical Exam: BP (!) 175/91 (BP Location: Left Arm, Patient Position: Sitting, Cuff Size: Normal)   Pulse 64   Constitutional:  Alert and oriented, No acute distress. HEENT: [place] AT, moist mucus membranes.  Trachea midline, no masses. Cardiovascular: No clubbing, cyanosis, or edema.  RRR Respiratory: Normal respiratory effort, no increased work of breathing.  Clear GI: Abdomen is soft, nontender, nondistended, no abdominal masses GU: No CVA tenderness Lymph: No cervical or inguinal lymphadenopathy. Skin: No rashes, bruises or suspicious lesions. Neurologic: Wheelchair bound Psychiatric: Normal mood and affect.   Assessment & Plan:   69 y.o. male with neurogenic bladder with recent increase  sediment production and clogging of catheter.  He has had increased symptomatic UTIs requiring hospitalization and IV antibiotics.  Will schedule dilation of his suprapubic tube tract and upsizing his catheter.  He would prefer to have this performed in same-day surgery under sedation.  We discussed potential complications including UTI/sepsis.  They indicated all questions were answered and desire to schedule.  Catheter was clamped today and urine was collected for culture.   Abbie Sons, [place] [address], [place], [place] [address] [phone]

## 2019-08-17 NOTE — H&P (View-Only) (Signed)
08/17/2019 10:25 AM   Jesus Sanchez 10-19-1950 [id number]  Referring provider: Kirk Ruths, MD [facility] [place],  [place] [address]  Chief Complaint  Patient presents with  . Hospitalization Follow-up    HPI: 69 y.o. male with multiple sclerosis and a neurogenic bladder.  He has a previous history of difficult suprapubic catheter changes.  His wife states he has been hospitalized with UTIs X 4 the last year. Since May 2020 he has had positive cultures for Proteus, Morganella, Pseudomonas and E. coli.  CT abdomen pelvis performed May 2020 showed no renal calculi or hydronephrosis.  UTI symptoms have included fever and sepsis.  His wife states he is producing much more sediment which will clog his 103 French catheter and has been unable to irrigate at times.  He will occasionally void through penis involuntarily when his catheter is not draining.  PMH: Past Medical History:  Diagnosis Date  . Complication of anesthesia    with sedation had to have narcan 1 month ago  . Coronary artery disease   . Depression   . Diabetes mellitus without complication ([place])   . GERD (gastroesophageal reflux disease)   . Hypertension   . MS (multiple sclerosis) ([place]) 10/02/2015  . Neuromuscular disorder ([place])    multiple sclerosis  . Sleep apnea    no cpap now being re evaluated    Surgical History: Past Surgical History:  Procedure Laterality Date  . CHOLECYSTECTOMY N/A 09/02/2016   Procedure: LAPAROSCOPIC CHOLECYSTECTOMY WITH CHOLANGIOGRAM;  Surgeon: Clayburn Pert, MD;  Location: [facility] ORS;  Service: General;  Laterality: N/A;  . CORONARY ANGIOPLASTY     stent 2000  . HERNIA REPAIR  2014   [facility]- Dr. Marina Gravel  . IR GENERIC HISTORICAL  07/02/2016   IR PERC CHOLECYSTOSTOMY 07/02/2016 Markus Daft, MD [facility]-INTERV RAD    Home Medications:  Allergies as of 08/17/2019      Reactions   Iodinated Diagnostic Agents Itching   Itching x 3 days   Oxycodone-acetaminophen Itching      Medication List       Accurate as of August 17, 2019 10:25 AM. If you have any questions, ask your nurse or doctor.        STOP taking these medications   baclofen 10 MG/5ML Soln injection Commonly known as: LIORESAL Stopped by: Abbie Sons, MD   chlorhexidine 0.12 % solution Commonly known as: PERIDEX Stopped by: Abbie Sons, MD   glimepiride 2 MG tablet Commonly known as: AMARYL Stopped by: Abbie Sons, MD   lidocaine 2 % solution Commonly known as: XYLOCAINE Stopped by: Abbie Sons, MD     TAKE these medications   aspirin EC 81 MG tablet Take 81 mg by mouth daily.   atorvastatin 10 MG tablet Commonly known as: LIPITOR Take 10 mg by mouth at bedtime.   citalopram 40 MG tablet Commonly known as: CELEXA Take 40 mg by mouth daily.   ertapenem 1 g injection Commonly known as: INVANZ   furosemide 20 MG tablet Commonly known as: LASIX Take 20 mg by mouth daily.   gabapentin 300 MG capsule Commonly known as: NEURONTIN Take 600-900 mg by mouth 2 (two) times daily. 900 mg at hs 628m  in am   ibuprofen 200 MG tablet Commonly known as: ADVIL Take 400 mg by mouth every 8 (eight) hours as needed (for pain.).   losartan 50 MG tablet Commonly known as: COZAAR Take 50 mg  by mouth daily.   losartan-hydrochlorothiazide 100-25 MG tablet Commonly known as: HYZAAR Take 1 tablet by mouth daily.   meloxicam 15 MG tablet Commonly known as: MOBIC Take 15 mg by mouth daily.   metFORMIN 500 MG tablet Commonly known as: GLUCOPHAGE Take 1,000 mg by mouth 2 (two) times daily with a meal.   ONE TOUCH ULTRA 2 w/Device Kit See admin instructions.   ONE TOUCH ULTRA TEST test strip Generic drug: glucose blood USE 2 (TWO) TIMES DAILY USE AS INSTRUCTED.   OneTouch Delica Lancets 38S Misc USE 1 EACH 2 (TWO) TIMES DAILY USE AS INSTRUCTED.   Lancets Misc Use 1 each 2 (two) times daily Use as instructed. ONE TOUCH Dx  E11.8   oxybutynin 5 MG tablet Commonly known as: DITROPAN Take 5 mg by mouth every 8 (eight) hours as needed (for urinary incontinence).   pantoprazole 40 MG tablet Commonly known as: PROTONIX Take 40 mg by mouth 2 (two) times daily.   Tysabri 300 MG/15ML injection Generic drug: natalizumab Inject 300 mg into the vein every 30 (thirty) days. Received at [facility] in [place]:  Allergies  Allergen Reactions  . Iodinated Diagnostic Agents Itching    Itching x 3 days  . Oxycodone-Acetaminophen Itching    Family History: Family History  Problem Relation Age of Onset  . Ulcerative colitis Mother   . Diabetes Mother   . Diabetes Sister   . Colon polyps Sister   . Prostate cancer Neg Hx   . Bladder Cancer Neg Hx   . Kidney cancer Neg Hx     Social History:  reports that he quit smoking about 41 years ago. He has a 50.00 pack-year smoking history. He quit smokeless tobacco use about 40 years ago. He reports that he does not drink alcohol or use drugs.  ROS: UROLOGY Frequent Urination?: No Hard to postpone urination?: No Burning/pain with urination?: No Get up at night to urinate?: No Leakage of urine?: No Urine stream starts and stops?: No Trouble starting stream?: No Do you have to strain to urinate?: No Blood in urine?: No Urinary tract infection?: Yes Sexually transmitted disease?: No Injury to kidneys or bladder?: No Painful intercourse?: No Weak stream?: No Erection problems?: No Penile pain?: No  Gastrointestinal Nausea?: No Vomiting?: No Indigestion/heartburn?: No Diarrhea?: No Constipation?: No  Constitutional Fever: No Night sweats?: No Weight loss?: No Fatigue?: No  Skin Skin rash/lesions?: No Itching?: No  Eyes Blurred vision?: No Double vision?: No  Ears/Nose/Throat Sore throat?: No Sinus problems?: No  Hematologic/Lymphatic Swollen glands?: No Easy bruising?: No  Cardiovascular Leg swelling?: No Chest  pain?: No  Respiratory Cough?: No Shortness of breath?: No  Endocrine Excessive thirst?: No  Musculoskeletal Back pain?: No Joint pain?: No  Neurological Headaches?: No Dizziness?: No  Psychologic Depression?: No Anxiety?: No  Physical Exam: BP (!) 175/91 (BP Location: Left Arm, Patient Position: Sitting, Cuff Size: Normal)   Pulse 64   Constitutional:  Alert and oriented, No acute distress. HEENT: [place] AT, moist mucus membranes.  Trachea midline, no masses. Cardiovascular: No clubbing, cyanosis, or edema.  RRR Respiratory: Normal respiratory effort, no increased work of breathing.  Clear GI: Abdomen is soft, nontender, nondistended, no abdominal masses GU: No CVA tenderness Lymph: No cervical or inguinal lymphadenopathy. Skin: No rashes, bruises or suspicious lesions. Neurologic: Wheelchair bound Psychiatric: Normal mood and affect.   Assessment & Plan:   69 y.o. male with neurogenic bladder with recent increase  sediment production and clogging of catheter.  He has had increased symptomatic UTIs requiring hospitalization and IV antibiotics.  Will schedule dilation of his suprapubic tube tract and upsizing his catheter.  He would prefer to have this performed in same-day surgery under sedation.  We discussed potential complications including UTI/sepsis.  They indicated all questions were answered and desire to schedule.  Catheter was clamped today and urine was collected for culture.   Abbie Sons, [place] [address], [place], [place] [address] [phone]

## 2019-08-19 ENCOUNTER — Encounter: Payer: Self-pay | Admitting: Urology

## 2019-08-22 LAB — CULTURE, URINE COMPREHENSIVE

## 2019-08-23 ENCOUNTER — Telehealth: Payer: Self-pay | Admitting: *Deleted

## 2019-08-23 NOTE — Telephone Encounter (Signed)
Notified patient as instructed, patient pleased. Discussed follow-up appointments, patient agrees  

## 2019-08-23 NOTE — Telephone Encounter (Signed)
-----   Message from Riki Altes, MD sent at 08/23/2019 12:40 PM EST ----- Urine culture grew mixed flora which is considered not significant

## 2019-08-28 ENCOUNTER — Other Ambulatory Visit: Payer: Self-pay | Admitting: Radiology

## 2019-08-28 DIAGNOSIS — N319 Neuromuscular dysfunction of bladder, unspecified: Secondary | ICD-10-CM

## 2019-08-31 ENCOUNTER — Other Ambulatory Visit: Payer: Self-pay

## 2019-08-31 ENCOUNTER — Encounter
Admission: RE | Admit: 2019-08-31 | Discharge: 2019-08-31 | Disposition: A | Discharge status: Home / Self Care | Payer: Medicare Other | Source: Ambulatory Visit | Attending: Urology | Admitting: Urology

## 2019-08-31 ENCOUNTER — Other Ambulatory Visit
Admission: RE | Admit: 2019-08-31 | Discharge: 2019-08-31 | Disposition: A | Discharge status: Home / Self Care | Payer: Medicare Other | Source: Ambulatory Visit | Attending: Urology | Admitting: Urology

## 2019-08-31 DIAGNOSIS — Z20822 Contact with and (suspected) exposure to covid-19: Secondary | ICD-10-CM | POA: Diagnosis not present

## 2019-08-31 DIAGNOSIS — Z01812 Encounter for preprocedural laboratory examination: Secondary | ICD-10-CM | POA: Insufficient documentation

## 2019-08-31 NOTE — Patient Instructions (Signed)
Your procedure is scheduled on: Tuesday September 04, 2019 Report to Day Surgery on the 2nd floor of the [facility]. To find out your arrival time, please call [phone] between 1PM - 3PM on: September 03, 2019 MONDAY  REMEMBER: Instructions that are not followed completely may result in serious medical risk, up to and including death; or upon the discretion of your surgeon and anesthesiologist your surgery may need to be rescheduled.  Do not eat food after midnight the night before surgery.  No gum chewing, lozengers or hard candies.  You may however, drink CLEAR liquids up to 2 hours before you are scheduled to arrive for your surgery. Do not drink anything within 2 hours of the start of your surgery.  Clear liquids include: - water   Do NOT drink anything that is not on this list.  Type 1 and Type 2 diabetics should only drink water.  No Alcohol for 24 hours before or after surgery.  No Smoking including e-cigarettes for 24 hours prior to surgery.  No chewable tobacco products for at least 6 hours prior to surgery.  No nicotine patches on the day of surgery.  On the morning of surgery brush your teeth with toothpaste and water, you may rinse your mouth with mouthwash if you wish. Do not swallow any toothpaste or mouthwash.  Notify your doctor if there is any change in your medical condition (cold, fever, infection).  Do not wear jewelry, make-up, hairpins, clips or nail polish.  Do not wear lotions, powders, or perfumes [place]OG[place]  Do not shave 48 hours prior to surgery.   Contacts and dentures may not be worn into surgery.  Do not bring valuables to the hospital, including drivers license, insurance or credit cards.  [facility] is not responsible for any belongings or valuables.   TAKE THESE MEDICATIONS THE MORNING OF SURGERY: CITALOPRAM GABAPENTIN PANTOPRAZOLE (take one the night before and one on the morning of surgery - helps to prevent nausea after  surgery.)  Use inhalers on the day of surgery and bring to the hospital.  Stop Metformin 2 days prior to surgery. LAST DOSE 09-01-2019  Follow recommendations from Cardiologist, Pulmonologist or PCP regarding stopping Aspirin, Coumadin, Plavix, Eliquis, Pradaxa, or Pletal.  Stop Anti-inflammatories (NSAIDS) such as Advil, Aleve, Ibuprofen, Motrin, Naproxen, Naprosyn and Aspirin based products such as Excedrin, [place]ys Powder, BC Powder. (May take Tylenol or Acetaminophen if needed.)  Stop ANY OVER THE COUNTER supplements until after surgery. (May continue Vitamin D, Vitamin B, and multivitamin.)  Wear comfortable clothing (specific to your surgery type) to the hospital.  Plan for stool softeners for home use.  If you are being discharged the day of surgery, you will not be allowed to drive home. You will need a responsible adult to drive you home and stay with you that night.   If you are taking public transportation, you will need to have a responsible adult with you. Please confirm with your physician that it is acceptable to use public transportation.   Please call [phone] if you have any questions about these instructions.

## 2019-09-01 LAB — SARS CORONAVIRUS 2 (TAT 6-24 HRS): SARS Coronavirus 2: NEGATIVE

## 2019-09-03 MED ORDER — SODIUM CHLORIDE 0.9 % IV SOLN
1.0000 g | INTRAVENOUS | Status: AC
  Administered 2019-09-04: 15:00:00 1 g via INTRAVENOUS
  Filled 2019-09-03: qty 10

## 2019-09-04 ENCOUNTER — Ambulatory Visit: Payer: Medicare Other | Admitting: Certified Registered Nurse Anesthetist

## 2019-09-04 ENCOUNTER — Other Ambulatory Visit: Payer: Self-pay

## 2019-09-04 ENCOUNTER — Ambulatory Visit
Admission: RE | Admit: 2019-09-04 | Discharge: 2019-09-04 | Disposition: A | Discharge status: Home / Self Care | Payer: Medicare Other | Attending: Urology | Admitting: Urology

## 2019-09-04 ENCOUNTER — Encounter
Admission: RE | Disposition: A | Discharge status: Home / Self Care | Payer: Self-pay | Source: Home / Self Care | Attending: Urology

## 2019-09-04 ENCOUNTER — Encounter: Payer: Self-pay | Admitting: Urology

## 2019-09-04 DIAGNOSIS — N319 Neuromuscular dysfunction of bladder, unspecified: Secondary | ICD-10-CM | POA: Diagnosis not present

## 2019-09-04 DIAGNOSIS — N318 Other neuromuscular dysfunction of bladder: Secondary | ICD-10-CM | POA: Diagnosis not present

## 2019-09-04 DIAGNOSIS — I10 Essential (primary) hypertension: Secondary | ICD-10-CM | POA: Insufficient documentation

## 2019-09-04 DIAGNOSIS — Z7982 Long term (current) use of aspirin: Secondary | ICD-10-CM | POA: Diagnosis not present

## 2019-09-04 DIAGNOSIS — E119 Type 2 diabetes mellitus without complications: Secondary | ICD-10-CM | POA: Insufficient documentation

## 2019-09-04 DIAGNOSIS — G4730 Sleep apnea, unspecified: Secondary | ICD-10-CM | POA: Insufficient documentation

## 2019-09-04 DIAGNOSIS — Z87891 Personal history of nicotine dependence: Secondary | ICD-10-CM | POA: Insufficient documentation

## 2019-09-04 DIAGNOSIS — G35 Multiple sclerosis: Secondary | ICD-10-CM | POA: Diagnosis not present

## 2019-09-04 DIAGNOSIS — Z79899 Other long term (current) drug therapy: Secondary | ICD-10-CM | POA: Insufficient documentation

## 2019-09-04 DIAGNOSIS — I2510 Atherosclerotic heart disease of native coronary artery without angina pectoris: Secondary | ICD-10-CM | POA: Diagnosis not present

## 2019-09-04 DIAGNOSIS — Z7984 Long term (current) use of oral hypoglycemic drugs: Secondary | ICD-10-CM | POA: Insufficient documentation

## 2019-09-04 DIAGNOSIS — K219 Gastro-esophageal reflux disease without esophagitis: Secondary | ICD-10-CM | POA: Insufficient documentation

## 2019-09-04 HISTORY — PX: CYSTOSCOPY WITH URETHRAL DILATATION: SHX5125

## 2019-09-04 LAB — GLUCOSE, CAPILLARY
Glucose-Capillary: 133 mg/dL — ABNORMAL HIGH (ref 70–99)
Glucose-Capillary: 116 mg/dL — ABNORMAL HIGH (ref 70–99)

## 2019-09-04 SURGERY — CYSTOSCOPY, WITH URETHRAL DILATION
Anesthesia: General | Site: Abdomen

## 2019-09-04 MED ORDER — FENTANYL CITRATE (PF) 100 MCG/2ML IJ SOLN
INTRAMUSCULAR | Status: AC
  Filled 2019-09-04: qty 2

## 2019-09-04 MED ORDER — FENTANYL CITRATE (PF) 100 MCG/2ML IJ SOLN: 25.0000 ug | INTRAMUSCULAR | Status: DC | PRN

## 2019-09-04 MED ORDER — PROPOFOL 10 MG/ML IV BOLUS
INTRAVENOUS | Status: AC
  Filled 2019-09-04: qty 60

## 2019-09-04 MED ORDER — PROPOFOL 500 MG/50ML IV EMUL
INTRAVENOUS | Status: DC | PRN
  Administered 2019-09-04: 20 ug/kg/min via INTRAVENOUS

## 2019-09-04 MED ORDER — LIDOCAINE HCL (CARDIAC) PF 100 MG/5ML IV SOSY
PREFILLED_SYRINGE | INTRAVENOUS | Status: DC | PRN
  Administered 2019-09-04: 80 mg via INTRAVENOUS

## 2019-09-04 MED ORDER — PROPOFOL 10 MG/ML IV BOLUS
INTRAVENOUS | Status: DC | PRN
  Administered 2019-09-04 (×2): 30 mg via INTRAVENOUS
  Administered 2019-09-04: 20 mg via INTRAVENOUS

## 2019-09-04 MED ORDER — SODIUM CHLORIDE 0.9 % IV SOLN: INTRAVENOUS | Status: DC

## 2019-09-04 MED ORDER — FENTANYL CITRATE (PF) 100 MCG/2ML IJ SOLN
INTRAMUSCULAR | Status: AC
  Administered 2019-09-04: 15:00:00 25 ug via INTRAVENOUS
  Filled 2019-09-04: qty 2

## 2019-09-04 SURGICAL SUPPLY — 21 items
BAG URINE DRAIN 2000ML AR STRL (UROLOGICAL SUPPLIES) IMPLANT
BAG URINE LEG 25OZ (MISCELLANEOUS) ×9 IMPLANT
CATH COUNCIL 22FR (CATHETERS) IMPLANT
CATH FOL 2WAY LX 16X5 (CATHETERS) IMPLANT
CATH FOLEY 2W COUNCIL 20FR 5CC (CATHETERS) ×3 IMPLANT
CATH SET URETHRAL DILATOR (CATHETERS) ×3 IMPLANT
ELECT REM PT RETURN 9FT ADLT (ELECTROSURGICAL)
ELECTRODE REM PT RTRN 9FT ADLT (ELECTROSURGICAL) IMPLANT
GLOVE BIO SURGEON STRL SZ8 (GLOVE) ×3 IMPLANT
GOWN STRL REUS W/ TWL XL LVL3 (GOWN DISPOSABLE) ×2 IMPLANT
GOWN STRL REUS W/TWL XL LVL3 (GOWN DISPOSABLE) ×4
HOLDER FOLEY CATH W/STRAP (MISCELLANEOUS) IMPLANT
PACK CYSTO AR (MISCELLANEOUS) ×3 IMPLANT
SENSORWIRE 0.038 NOT ANGLED (WIRE) ×3
SET CYSTO W/LG BORE CLAMP LF (SET/KITS/TRAYS/PACK) IMPLANT
SPONGE DRAIN TRACH 4X4 STRL 2S (GAUZE/BANDAGES/DRESSINGS) ×3 IMPLANT
SYR 30ML LL (SYRINGE) ×3 IMPLANT
VALVE UROSEAL ADJ ENDO (VALVE) ×3 IMPLANT
WATER STERILE IRR 1000ML POUR (IV SOLUTION) ×3 IMPLANT
WATER STERILE IRR 3000ML UROMA (IV SOLUTION) IMPLANT
WIRE SENSOR 0.038 NOT ANGLED (WIRE) ×1 IMPLANT

## 2019-09-04 NOTE — Anesthesia Preprocedure Evaluation (Signed)
Anesthesia Evaluation  Patient identified by MRN, date of birth, ID band Patient awake    Reviewed: Allergy & Precautions, H&P , NPO status , Patient's Chart, lab work & pertinent test results  Airway Mallampati: III  TM Distance: >3 FB Neck ROM: full    Dental  (+) Upper Dentures   Pulmonary sleep apnea , former smoker,           Cardiovascular hypertension, (-) angina+ CAD  (-) Past MI (-) dysrhythmias      Neuro/Psych PSYCHIATRIC DISORDERS Depression Multiple sclerosis, wheelchair bound  Neuromuscular disease    GI/Hepatic Neg liver ROS, GERD  Controlled and Medicated,  Endo/Other  diabetes  Renal/GU negative Renal ROS  negative genitourinary   Musculoskeletal   Abdominal   Peds  Hematology negative hematology ROS (+)   Anesthesia Other Findings Past Medical History: No date: Complication of anesthesia     Comment:  with sedation had to have narcan 2020 No date: Coronary artery disease No date: Depression No date: Diabetes mellitus without complication ([facility]) No date: GERD (gastroesophageal reflux disease) No date: Hypertension 10/02/2015: MS (multiple sclerosis) ([facility]) No date: Neuromuscular disorder ([facility])     Comment:  multiple sclerosis No date: Sleep apnea  Past Surgical History: 09/02/2016: CHOLECYSTECTOMY; N/A     Comment:  Procedure: LAPAROSCOPIC CHOLECYSTECTOMY WITH               CHOLANGIOGRAM;  Surgeon: Ricarda Frame, MD;  Location:               [facility] ORS;  Service: General;  Laterality: N/A; No date: CORONARY ANGIOPLASTY     Comment:  stent 2000 2014: HERNIA REPAIR     Comment:  [facility]- Dr. Egbert Garibaldi 07/02/2016: IR GENERIC HISTORICAL     Comment:  IR PERC CHOLECYSTOSTOMY 07/02/2016 Richarda Overlie, MD               [facility]-INTERV RAD No date: TONSILLECTOMY     Reproductive/Obstetrics negative OB ROS                             Anesthesia Physical Anesthesia Plan  ASA:  III  Anesthesia Plan: General   Post-op Pain Management:    Induction:   PONV Risk Score and Plan:   Airway Management Planned:   Additional Equipment:   Intra-op Plan:   Post-operative Plan:   Informed Consent: I have reviewed the patients History and Physical, chart, labs and discussed the procedure including the risks, benefits and alternatives for the proposed anesthesia with the patient or authorized representative who has indicated his/her understanding and acceptance.     Dental Advisory Given  Plan Discussed with: Anesthesiologist  Anesthesia Plan Comments:         Anesthesia Quick Evaluation

## 2019-09-04 NOTE — OR Nursing (Signed)
Patient and wife report patient has areas of red irritation on sacral area. Wife reports that skin is not open.

## 2019-09-04 NOTE — OR Nursing (Signed)
Dr Orland Penman and Dr Virl Diamond aware of baclofen pump currently. No new orders.

## 2019-09-04 NOTE — Discharge Instructions (Signed)
AMBULATORY SURGERY  DISCHARGE INSTRUCTIONS   1) The drugs that you were given will stay in your system until tomorrow so for the next 24 hours you should not:  A) Drive an automobile B) Make any legal decisions C) Drink any alcoholic beverage   2) You may resume regular meals tomorrow.  Today it is better to start with liquids and gradually work up to solid foods.  You may eat anything you prefer, but it is better to start with liquids, then soup and crackers, and gradually work up to solid foods.   3) Please notify your doctor immediately if you have any unusual bleeding, trouble breathing, redness and pain at the surgery site, drainage, fever, or pain not relieved by medication.    4) Additional Instructions:        Please contact your physician with any problems or Same Day Surgery at [phone], Monday through Friday 6 am to 4 pm, or [place] at [facility] number at [phone].Discharge instructions   1.  Please contact [facility] at [phone] for catheter drainage problems or fever >101 degrees  2.  The office will contact you for a follow-up in approximately 1 month for suprapubic tube change.

## 2019-09-04 NOTE — Progress Notes (Signed)
Pt felt like needed to void passed small clot  Urine blood tinged   Feels better

## 2019-09-04 NOTE — Op Note (Signed)
Preoperative diagnosis:  Neurogenic bladder with indwelling suprapubic catheter  Postoperative diagnosis:  Same  Procedure: Flexible cystoscopy via suprapubic tract Dilation suprapubic tract  Surgeon: Abbie Sons, MD  Anesthesia: MAC  Complications: None  Intraoperative findings:  1.  Cystoscopy: Narrowing approximately 1 cm within the suprapubic tract.  Bladder mucosa with mild erythema secondary to indwelling SP tube.  No solid or papillary lesions noted  EBL: Minimal  Specimens: None  Indication: Jesus Sanchez is a 69 y.o. patient with multiple sclerosis and neurogenic bladder managed with an indwelling suprapubic catheter.  He has a 83 French catheter and a history of difficult SP tube placement.  He has recently required hospitalizations at [facility] for febrile UTIs and has had difficulty with catheter drainage secondary to debris/encrustation.  After reviewing the management options for treatment, he elected to proceed with the above surgical procedure(s). We have discussed the potential benefits and risks of the procedure, side effects of the proposed treatment, the likelihood of the patient achieving the goals of the procedure, and any potential problems that might occur during the procedure or recuperation. Informed consent has been obtained.  Description of procedure:  The patient was taken to the operating room and general anesthesia was induced.  The patient was placed in the supine position, prepped and draped in the usual sterile fashion, and preoperative antibiotics were administered. A preoperative time-out was performed.   His SP tube was removed and a flexible cystoscope was placed to the suprapubic tract and panendoscopy was performed with findings as described above.  A 0.038 Sensor wire was then placed through the cystoscope and the cystoscope was removed.  The tract was then dilated with Heyman-Bard dilators from 16-24 [place] however due to the curve dilation  was felt to be suboptimal.  A 22 French Councill catheter would not advance into the bladder.  The tract was redilated with over-the-wire disposable urethral dilators from 16-24 [place].  The 62 French Councill catheter was then easily advanced over the wire.  Urine return was blood-tinged.  The balloon was inflated with 10 cc of sterile water.  He was transported to the PACU in stable condition.  Plan: Office follow-up 1 month for suprapubic catheter change.   Abbie Sons, M.D.

## 2019-09-04 NOTE — Interval H&P Note (Signed)
History and Physical Interval Note:  09/04/2019 2:02 PM  Jesus Sanchez [facility]  has presented today for surgery, with the diagnosis of neurogenic bladder.  The various methods of treatment have been discussed with the patient and family. After consideration of risks, benefits and other options for treatment, the patient has consented to  Procedure(s): CYSTOSCOPY WITH Suprapubic Tract DILATATION (N/A) as a surgical intervention.  The patient's history has been reviewed, patient examined, no change in status, stable for surgery.  I have reviewed the patient's chart and labs.  Questions were answered to the patient's satisfaction.     Jesus Sanchez

## 2019-09-04 NOTE — Transfer of Care (Signed)
Immediate Anesthesia Transfer of Care Note  Patient: Jesus Sanchez  Procedure(s) Performed: CYSTOSCOPY WITH Suprapubic Tract DILATATION (N/A Abdomen)  Patient Location: PACU  Anesthesia Type:General  Level of Consciousness: awake, alert  and oriented  Airway & Oxygen Therapy: Patient Spontanous Breathing and Patient connected to face mask oxygen  Post-op Assessment: Report given to RN and Post -op Vital signs reviewed and stable  Post vital signs: Reviewed and stable  Last Vitals:  Vitals Value Taken Time  BP 163/80 09/04/19 1508  Temp    Pulse 76 09/04/19 1511  Resp 0 09/04/19 1511  SpO2 100 % 09/04/19 1511  Vitals shown include unvalidated device data.  Last Pain:  Vitals:   09/04/19 1214  TempSrc: Temporal  PainSc: 3          Complications: No apparent anesthesia complications

## 2019-09-05 ENCOUNTER — Telehealth: Payer: Self-pay

## 2019-09-05 NOTE — Anesthesia Postprocedure Evaluation (Signed)
Anesthesia Post Note  Patient: Jesus Sanchez  Procedure(s) Performed: CYSTOSCOPY WITH Suprapubic Tract DILATATION (N/A Abdomen)  Patient location during evaluation: PACU Anesthesia Type: General Level of consciousness: awake and alert Pain management: pain level controlled Vital Signs Assessment: post-procedure vital signs reviewed and stable Respiratory status: spontaneous breathing, nonlabored ventilation and respiratory function stable Cardiovascular status: blood pressure returned to baseline and stable Postop Assessment: no apparent nausea or vomiting Anesthetic complications: no     Last Vitals:  Vitals:   09/04/19 1603 09/04/19 1630  BP: (!) 160/78 (!) 158/76  Pulse: 80 81  Resp: 17 16  Temp: 36.7 C (!) 36.1 C  SpO2: 97% 97%    Last Pain:  Vitals:   09/05/19 0851  TempSrc:   PainSc: 0-No pain                 Karleen Hampshire

## 2019-09-05 NOTE — Telephone Encounter (Signed)
[facility]([phone]) called wanting to know what orders you would like as to care for patient's SPT. Does he need monitoring, education or wound check?

## 2019-09-06 NOTE — Telephone Encounter (Signed)
No special orders- his tube was upsized

## 2019-09-07 ENCOUNTER — Telehealth: Payer: Self-pay | Admitting: *Deleted

## 2019-09-07 MED ORDER — OXCARBAZEPINE 150 MG PO TABS
150.00 | ORAL_TABLET | ORAL | Status: DC
Start: 2019-09-09 — End: 2019-09-07

## 2019-09-07 MED ORDER — ACETAMINOPHEN 325 MG PO TABS
650.00 | ORAL_TABLET | ORAL | Status: DC
Start: ? — End: 2019-09-07

## 2019-09-07 MED ORDER — LIDOCAINE HCL 1 % IJ SOLN
0.50 | INTRAMUSCULAR | Status: DC
Start: ? — End: 2019-09-07

## 2019-09-07 MED ORDER — INSULIN LISPRO 100 UNIT/ML ~~LOC~~ SOLN
0.00 | SUBCUTANEOUS | Status: DC
Start: 2019-09-09 — End: 2019-09-07

## 2019-09-07 MED ORDER — POLYETHYLENE GLYCOL 3350 17 GM/SCOOP PO POWD
17.00 | ORAL | Status: DC
Start: ? — End: 2019-09-07

## 2019-09-07 MED ORDER — GENERIC EXTERNAL MEDICATION
500.00 | Status: DC
Start: 2019-09-09 — End: 2019-09-07

## 2019-09-07 MED ORDER — GABAPENTIN 300 MG PO CAPS
900.00 | ORAL_CAPSULE | ORAL | Status: DC
Start: 2019-09-07 — End: 2019-09-07

## 2019-09-07 MED ORDER — GLUCAGON (RDNA) 1 MG IJ KIT
1.00 | PACK | INTRAMUSCULAR | Status: DC
Start: ? — End: 2019-09-07

## 2019-09-07 MED ORDER — ENOXAPARIN SODIUM 40 MG/0.4ML ~~LOC~~ SOLN
40.00 | SUBCUTANEOUS | Status: DC
Start: 2019-09-10 — End: 2019-09-07

## 2019-09-07 MED ORDER — PANTOPRAZOLE SODIUM 40 MG PO TBEC
40.00 | DELAYED_RELEASE_TABLET | ORAL | Status: DC
Start: 2019-09-09 — End: 2019-09-07

## 2019-09-07 MED ORDER — ASPIRIN 81 MG PO TBEC
81.00 | DELAYED_RELEASE_TABLET | ORAL | Status: DC
Start: 2019-09-10 — End: 2019-09-07

## 2019-09-07 MED ORDER — CITALOPRAM HYDROBROMIDE 20 MG PO TABS
40.00 | ORAL_TABLET | ORAL | Status: DC
Start: 2019-09-10 — End: 2019-09-07

## 2019-09-07 MED ORDER — LOSARTAN POTASSIUM 50 MG PO TABS
50.00 | ORAL_TABLET | ORAL | Status: DC
Start: 2019-09-10 — End: 2019-09-07

## 2019-09-07 MED ORDER — DEXTROSE 50 % IV SOLN
12.50 | INTRAVENOUS | Status: DC
Start: ? — End: 2019-09-07

## 2019-09-07 MED ORDER — OXYBUTYNIN CHLORIDE 5 MG PO TABS
5.00 | ORAL_TABLET | ORAL | Status: DC
Start: 2019-09-10 — End: 2019-09-07

## 2019-09-07 MED ORDER — ATORVASTATIN CALCIUM 10 MG PO TABS
10.00 | ORAL_TABLET | ORAL | Status: DC
Start: 2019-09-10 — End: 2019-09-07

## 2019-09-07 NOTE — Telephone Encounter (Signed)
[facility] [place], [place]) [phone] called Triage line to discuss monitoring patient post SPT placement. Patient requests monitoring until his next SPT change.

## 2019-09-10 MED ORDER — GABAPENTIN 300 MG PO CAPS
900.00 | ORAL_CAPSULE | ORAL | Status: DC
Start: 2019-09-09 — End: 2019-09-10

## 2019-09-10 MED ORDER — MELATONIN 3 MG PO TABS
3.00 | ORAL_TABLET | ORAL | Status: DC
Start: 2019-09-09 — End: 2019-09-10

## 2019-09-10 MED ORDER — TRAZODONE HCL 50 MG PO TABS
25.00 | ORAL_TABLET | ORAL | Status: DC
Start: 2019-09-09 — End: 2019-09-10

## 2019-09-10 NOTE — Telephone Encounter (Signed)
All he had was an suprapubic tube upsize.  There is nothing that really needs monitoring.  What exactly does he want?

## 2019-09-10 NOTE — Telephone Encounter (Signed)
Patient wife called, no need for home health for SPT tube. Will keep appointment on 10/05/19.

## 2019-09-13 NOTE — Telephone Encounter (Signed)
Spoke w/Misty at [employer] and notified her no orders were needed

## 2019-10-02 ENCOUNTER — Ambulatory Visit: Payer: Medicare Other | Admitting: Urology

## 2019-10-05 ENCOUNTER — Ambulatory Visit (INDEPENDENT_AMBULATORY_CARE_PROVIDER_SITE_OTHER): Payer: Medicare Other | Admitting: Urology

## 2019-10-05 ENCOUNTER — Encounter: Payer: Self-pay | Admitting: Urology

## 2019-10-05 ENCOUNTER — Other Ambulatory Visit: Payer: Self-pay

## 2019-10-05 VITALS — BP 130/68 | HR 70 | Ht 70.0 in | Wt 175.0 lb

## 2019-10-05 DIAGNOSIS — N319 Neuromuscular dysfunction of bladder, unspecified: Secondary | ICD-10-CM

## 2019-10-05 NOTE — Progress Notes (Signed)
Suprapubic Cath Change Patient is present today for a suprapubic catheter change due to urinary retention.  10 ml of water was drained from the balloon, a 22 FR foley cath was removed from the tract with out difficulty.  Site was cleaned and prepped in a sterile fashion with betadine.  A 22 FR foley cath was replaced into the tract no complications were noted. Urine return was noted, 10 ml of sterile water was inflated into the balloon and an overnight bag was attached for drainage.  Patient tolerated well.     Performed by: Michiel Cowboy, PA-C  Follow up: One month

## 2019-11-05 DIAGNOSIS — M1812 Unilateral primary osteoarthritis of first carpometacarpal joint, left hand: Secondary | ICD-10-CM | POA: Insufficient documentation

## 2019-11-06 ENCOUNTER — Ambulatory Visit (INDEPENDENT_AMBULATORY_CARE_PROVIDER_SITE_OTHER): Payer: Medicare Other | Admitting: Urology

## 2019-11-06 ENCOUNTER — Other Ambulatory Visit: Payer: Self-pay

## 2019-11-06 DIAGNOSIS — N319 Neuromuscular dysfunction of bladder, unspecified: Secondary | ICD-10-CM | POA: Diagnosis not present

## 2019-11-06 NOTE — Progress Notes (Signed)
Cath Change/ Replacement  Patient is present today for a catheter change due to urinary retention.  10 ml of water was removed from the balloon, a 22 FR foley cath was removed with out difficulty.  Patient was cleaned and prepped in a sterile fashion with betadine. A 22 FR foley cath was replaced into the bladder no complications were noted Urine return was noted 5 ml and urine was clear in color. The balloon was filled with 61ml of sterile water. An overnight bag was attached for drainage.  Patient was given proper instruction on catheter care.    Performed by: Michiel Cowboy, PA-C  Follow up: One month for SPT exchange

## 2019-11-14 ENCOUNTER — Other Ambulatory Visit: Payer: Self-pay | Admitting: Radiology

## 2019-11-14 ENCOUNTER — Other Ambulatory Visit: Payer: Self-pay

## 2019-11-14 ENCOUNTER — Ambulatory Visit (INDEPENDENT_AMBULATORY_CARE_PROVIDER_SITE_OTHER): Payer: Medicare Other | Admitting: Urology

## 2019-11-14 DIAGNOSIS — N319 Neuromuscular dysfunction of bladder, unspecified: Secondary | ICD-10-CM

## 2019-11-14 DIAGNOSIS — T83010A Breakdown (mechanical) of cystostomy catheter, initial encounter: Secondary | ICD-10-CM

## 2019-11-15 NOTE — Progress Notes (Signed)
Patient on schedule for SPT placement 11/20/2019, spoke with wife with pre procedure instructions given, made aware to be NPO after MN prior to procedure as well as to arrive @ 1000, and driver for discharge after discharge.stated understanding.

## 2019-11-16 ENCOUNTER — Other Ambulatory Visit: Payer: Self-pay | Admitting: Radiology

## 2019-11-18 ENCOUNTER — Encounter: Payer: Self-pay | Admitting: Urology

## 2019-11-18 NOTE — Progress Notes (Signed)
11/14/2019 5:11 PM   Jesus Sanchez Feb 19, 1951 [id number]  Referring provider: Kirk Ruths, MD [facility] [place],  [place] [address]  Chief Complaint  Patient presents with  . Neurogenic bladder    HPI: Jesus Sanchez is a 69 year old male with MS with a neurogenic bladder managed with a SPT who contacted the office today stating the SPT had fallen out.  He states he was running errands today when a family member who was with him noticed his tube had fallen out.  He contacted [place] immediately and this was an hour ago.  He did not feel any pain when the Foley came out and the balloon was deflated.   PMH: Past Medical History:  Diagnosis Date  . Complication of anesthesia    with sedation had to have narcan 2020  . Coronary artery disease   . Depression   . Diabetes mellitus without complication ([place])   . GERD (gastroesophageal reflux disease)   . Hypertension   . MS (multiple sclerosis) ([place]) 10/02/2015  . Neuromuscular disorder ([place])    multiple sclerosis  . Sleep apnea     Surgical History: Past Surgical History:  Procedure Laterality Date  . CHOLECYSTECTOMY N/A 09/02/2016   Procedure: LAPAROSCOPIC CHOLECYSTECTOMY WITH CHOLANGIOGRAM;  Surgeon: Clayburn Pert, MD;  Location: [facility] ORS;  Service: General;  Laterality: N/A;  . CORONARY ANGIOPLASTY     stent 2000  . CYSTOSCOPY WITH URETHRAL DILATATION N/A 09/04/2019   Procedure: CYSTOSCOPY WITH Suprapubic Tract DILATATION;  Surgeon: Abbie Sons, MD;  Location: [facility] ORS;  Service: Urology;  Laterality: N/A;  . HERNIA REPAIR  2014   [facility]- Dr. Marina Gravel  . IR GENERIC HISTORICAL  07/02/2016   IR PERC CHOLECYSTOSTOMY 07/02/2016 Markus Daft, MD [facility]-INTERV RAD  . TONSILLECTOMY      Home Medications:  Allergies as of 11/14/2019      Reactions   Iodinated Diagnostic Agents Itching   Itching x 3 days   Oxycodone-acetaminophen Itching      Medication List       Accurate as of  November 14, 2019 11:59 PM. If you have any questions, ask your nurse or doctor.        aspirin EC 81 MG tablet Take 81 mg by mouth at bedtime.   atorvastatin 10 MG tablet Commonly known as: LIPITOR Take 10 mg by mouth at bedtime.   citalopram 40 MG tablet Commonly known as: CELEXA Take 40 mg by mouth daily.   ertapenem 1 g injection Commonly known as: INVANZ   furosemide 20 MG tablet Commonly known as: LASIX Take 20 mg by mouth daily as needed (fluid retention.).   gabapentin 300 MG capsule Commonly known as: NEURONTIN Take 900 mg by mouth 3 (three) times daily.   ibuprofen 200 MG tablet Commonly known as: ADVIL Take 600 mg by mouth every 8 (eight) hours as needed (for pain.).   losartan 50 MG tablet Commonly known as: COZAAR Take 50 mg by mouth daily.   metFORMIN 500 MG tablet Commonly known as: GLUCOPHAGE Take 500 mg by mouth daily with breakfast.   ONE TOUCH ULTRA 2 w/Device Kit See admin instructions.   ONE TOUCH ULTRA TEST test strip Generic drug: glucose blood USE 2 (TWO) TIMES DAILY USE AS INSTRUCTED.   OneTouch Delica Lancets 67R Misc USE 1 EACH 2 (TWO) TIMES DAILY USE AS INSTRUCTED.   Lancets Misc Use 1 each 2 (two) times daily Use as instructed.  ONE TOUCH Dx E11.8   oxybutynin 5 MG tablet Commonly known as: DITROPAN Take 5 mg by mouth every 8 (eight) hours as needed (for urinary incontinence).   PAIN MANAGEMENT INTRATHECAL (IT) PUMP 1 each by Intrathecal route continuous. Intrathecal (IT) medication:Baclofen   pantoprazole 40 MG tablet Commonly known as: PROTONIX Take 40 mg by mouth 2 (two) times daily.   Tysabri 300 MG/15ML injection Generic drug: natalizumab Inject 300 mg into the vein every 6 (six) weeks. Received at [facility] in [place]:  Allergies  Allergen Reactions  . Iodinated Diagnostic Agents Itching    Itching x 3 days  . Oxycodone-Acetaminophen Itching    Family History: Family History  Problem  Relation Age of Onset  . Ulcerative colitis Mother   . Diabetes Mother   . Diabetes Sister   . Colon polyps Sister   . Prostate cancer Neg Hx   . Bladder Cancer Neg Hx   . Kidney cancer Neg Hx     Social History:  reports that he quit smoking about 41 years ago. He has a 50.00 pack-year smoking history. He quit smokeless tobacco use about 40 years ago. He reports that he does not drink alcohol or use drugs.  ROS: Pertinent ROS in HPI  Physical Exam: Constitutional:  Well nourished. Alert and oriented, No acute distress. HEENT: [place] AT, mask in place.  Trachea midline. Cardiovascular: No clubbing, cyanosis, or edema. Respiratory: Normal respiratory effort, no increased work of breathing. GI: Abdomen is soft, non tender, non distended, no abdominal masses. SPT site is clean. Neurologic: Grossly intact, no focal deficits, in wheelchair Psychiatric: Normal mood and affect.  Laboratory Data: Lab Results  Component Value Date   WBC 11.2 (H) 09/03/2016   HGB 14.6 09/03/2016   HCT 41.4 09/03/2016   MCV 94.0 09/03/2016   PLT 172 09/03/2016    Lab Results  Component Value Date   CREATININE 0.71 09/03/2016    Lab Results  Component Value Date   HGBA1C 6.7 (H) 07/02/2016    Lab Results  Component Value Date   TSH 2.03 06/16/2013    Lab Results  Component Value Date   AST 46 (H) 09/03/2016   Lab Results  Component Value Date   ALT 48 09/03/2016    Urinalysis    Component Value Date/Time   COLORURINE YELLOW (A) 08/24/2016 1336   APPEARANCEUR CLOUDY (A) 08/24/2016 1336   APPEARANCEUR Hazy 06/15/2013 1933   LABSPEC 1.010 08/24/2016 1336   LABSPEC 1.012 06/15/2013 1933   PHURINE 7.0 08/24/2016 1336   GLUCOSEU NEGATIVE 08/24/2016 1336   GLUCOSEU Negative 06/15/2013 1933   HGBUR SMALL (A) 08/24/2016 1336   BILIRUBINUR NEGATIVE 08/24/2016 1336   BILIRUBINUR Negative 06/15/2013 1933   KETONESUR NEGATIVE 08/24/2016 1336   PROTEINUR 30 (A) 08/24/2016 1336   NITRITE  POSITIVE (A) 08/24/2016 1336   LEUKOCYTESUR LARGE (A) 08/24/2016 1336   LEUKOCYTESUR Trace 06/15/2013 1933    I have reviewed the labs.  Suprapubic Cath Change Patient is present today for a suprapubic catheter change placement due to balloon malfunction of previous SPT.  Site was cleaned and prepped in a sterile fashion with betadine.  A 22 FR foley cath was attempted but could not be placed.  A second attempt with a 16 FR foley was also unsuccessful.    Dr. Bernardo Heater then attempted to assess the bladder through suprapubic site with guidewires, but that was also unsuccessful.    It was decided to  place a an indwelling Foley through the urethra and have his SPT placed again with IR.  Cath Change/ Replacement Patient was cleaned and prepped in a sterile fashion with betadine. A 16 FR coude foley cath was replaced into the bladder no complications were noted Urine return was noted 50 ml and urine was yellow in color. The balloon was filled with 41m of sterile water. A night bag was attached for drainage.    Performed by: SZara Council PA-C, SJohn Giovanni MD and CElberta Leatherwood CMA  Assessment & Plan:    1. Suprapubic tube catheter dysfunction Patient will be scheduled for a placement of a new SPT site with IR next week   Return for SPT placement with IR .  These notes generated with voice recognition software. I apologize for typographical errors.  SZara Council PA-C  [facility] [address] [place] [phone]

## 2019-11-20 ENCOUNTER — Other Ambulatory Visit: Payer: Self-pay | Admitting: Radiology

## 2019-11-20 ENCOUNTER — Other Ambulatory Visit: Payer: Self-pay

## 2019-11-20 ENCOUNTER — Ambulatory Visit
Admission: RE | Admit: 2019-11-20 | Discharge: 2019-11-20 | Disposition: A | Discharge status: Home / Self Care | Payer: Medicare Other | Source: Ambulatory Visit | Attending: Urology | Admitting: Urology

## 2019-11-20 DIAGNOSIS — E119 Type 2 diabetes mellitus without complications: Secondary | ICD-10-CM | POA: Diagnosis not present

## 2019-11-20 DIAGNOSIS — N319 Neuromuscular dysfunction of bladder, unspecified: Secondary | ICD-10-CM | POA: Diagnosis not present

## 2019-11-20 DIAGNOSIS — I10 Essential (primary) hypertension: Secondary | ICD-10-CM | POA: Diagnosis not present

## 2019-11-20 DIAGNOSIS — G35 Multiple sclerosis: Secondary | ICD-10-CM | POA: Insufficient documentation

## 2019-11-20 DIAGNOSIS — Z79899 Other long term (current) drug therapy: Secondary | ICD-10-CM | POA: Diagnosis not present

## 2019-11-20 DIAGNOSIS — Z7982 Long term (current) use of aspirin: Secondary | ICD-10-CM | POA: Insufficient documentation

## 2019-11-20 DIAGNOSIS — Z87891 Personal history of nicotine dependence: Secondary | ICD-10-CM | POA: Diagnosis not present

## 2019-11-20 DIAGNOSIS — I2510 Atherosclerotic heart disease of native coronary artery without angina pectoris: Secondary | ICD-10-CM | POA: Diagnosis not present

## 2019-11-20 DIAGNOSIS — Z7984 Long term (current) use of oral hypoglycemic drugs: Secondary | ICD-10-CM | POA: Diagnosis not present

## 2019-11-20 DIAGNOSIS — G4730 Sleep apnea, unspecified: Secondary | ICD-10-CM | POA: Diagnosis not present

## 2019-11-20 LAB — CBC
HCT: 43.4 % (ref 39.0–52.0)
Hemoglobin: 15 g/dL (ref 13.0–17.0)
MCH: 32.3 pg (ref 26.0–34.0)
MCHC: 34.6 g/dL (ref 30.0–36.0)
MCV: 93.5 fL (ref 80.0–100.0)
Platelets: 132 10*3/uL — ABNORMAL LOW (ref 150–400)
RBC: 4.64 MIL/uL (ref 4.22–5.81)
RDW: 14.8 % (ref 11.5–15.5)
WBC: 8.7 10*3/uL (ref 4.0–10.5)
nRBC: 0.6 % — ABNORMAL HIGH (ref 0.0–0.2)

## 2019-11-20 LAB — URINALYSIS, ROUTINE W REFLEX MICROSCOPIC
Bilirubin Urine: NEGATIVE
Glucose, UA: NEGATIVE mg/dL
Ketones, ur: NEGATIVE mg/dL
Nitrite: POSITIVE — AB
Protein, ur: NEGATIVE mg/dL
RBC / HPF: >50 RBC/hpf — ABNORMAL HIGH (ref 0–5)
Specific Gravity, Urine: 1.004 — ABNORMAL LOW (ref 1.005–1.030)
Squamous Epithelial / HPF: NONE SEEN (ref 0–5)
pH: 8 (ref 5.0–8.0)

## 2019-11-20 MED ORDER — DIPHENHYDRAMINE HCL 50 MG/ML IJ SOLN
25.0000 mg | Freq: Once | INTRAMUSCULAR | Status: AC
  Filled 2019-11-20: qty 0.5

## 2019-11-20 MED ORDER — FENTANYL CITRATE (PF) 100 MCG/2ML IJ SOLN
INTRAMUSCULAR | Status: AC | PRN
  Administered 2019-11-20: 25 ug via INTRAVENOUS

## 2019-11-20 MED ORDER — FENTANYL CITRATE (PF) 100 MCG/2ML IJ SOLN
INTRAMUSCULAR | Status: AC
  Filled 2019-11-20: qty 2

## 2019-11-20 MED ORDER — MIDAZOLAM HCL 2 MG/2ML IJ SOLN
INTRAMUSCULAR | Status: AC
  Filled 2019-11-20: qty 2

## 2019-11-20 MED ORDER — CIPROFLOXACIN IN D5W 400 MG/200ML IV SOLN
400.0000 mg | Freq: Once | INTRAVENOUS | Status: AC
  Filled 2019-11-20: qty 200

## 2019-11-20 MED ORDER — SODIUM CHLORIDE 0.9 % IV SOLN: INTRAVENOUS | Status: DC

## 2019-11-20 MED ORDER — HYDRALAZINE HCL 20 MG/ML IJ SOLN: 10.0000 mg | Freq: Once | INTRAMUSCULAR | Status: DC

## 2019-11-20 MED ORDER — CIPROFLOXACIN IN D5W 400 MG/200ML IV SOLN
INTRAVENOUS | Status: AC
  Administered 2019-11-20: 13:00:00 400 mg via INTRAVENOUS
  Filled 2019-11-20: qty 200

## 2019-11-20 MED ORDER — HYDRALAZINE HCL 20 MG/ML IJ SOLN
INTRAMUSCULAR | Status: AC
  Filled 2019-11-20: qty 1

## 2019-11-20 MED ORDER — CEFAZOLIN SODIUM-DEXTROSE 2-4 GM/100ML-% IV SOLN
2.0000 g | Freq: Once | INTRAVENOUS | Status: AC
  Administered 2019-11-20: 2 g via INTRAVENOUS
  Filled 2019-11-20: qty 100

## 2019-11-20 MED ORDER — DIPHENHYDRAMINE HCL 50 MG/ML IJ SOLN
INTRAMUSCULAR | Status: AC
  Administered 2019-11-20: 14:00:00 25 mg via INTRAVENOUS
  Filled 2019-11-20: qty 1

## 2019-11-20 NOTE — Progress Notes (Signed)
Dr. Fredia Sorrow in at bedside, speaking with pt. And wife re: procedure, UA sent, return in approx. 4 weeks for upside on suprapubic catheter. Pt. And wife agreeable and verbalized understanding of conversation. Pt. Fed sandwich tray now.

## 2019-11-20 NOTE — Sedation Documentation (Signed)
Dr Fredia Sorrow notified of elevated BP

## 2019-11-20 NOTE — Sedation Documentation (Signed)
Pt Cipro started earlier. Pt reporting itching of left forearm. Forearm noted to be red. NS/Cipro line stopped. Dr Fredia Sorrow notified. New IV line primed and started. Benadryl 25 mg IV ordered.

## 2019-11-20 NOTE — Procedures (Signed)
Interventional Radiology Procedure Note  Procedure: CT guided suprapubic bladder catheter insertion  Complications: None  Estimated Blood Loss: < 10 mL  Findings: 14 Fr pigtail drainage catheter placed in bladder lumen under CT guidance. Good return of urine. Urine sample sent for culture/urinalysis. Catheter attached to gravity bag.  Jodi Marble. Fredia Sorrow, M.D Pager:  [phone]

## 2019-11-20 NOTE — Progress Notes (Signed)
MD notified of continued elevated BP; new orders received. Suprapubic site draining clear light yellow UOP. Pt. Left arm hives and redness mostly gone. Pt. States "it doesn't itch any more.". Await MD to come speak with wife and pt.

## 2019-11-20 NOTE — H&P (Signed)
Chief Complaint: Patient was seen in consultation today for suprapubic catheter placement.  Referring Physician(s): McGowan,Purvi Ruehl A  Supervising Physician: Aletta Edouard  Patient Status: [facility] - Out-pt  History of Present Illness: Jesus Sanchez is a 69 y.o. male with a past medical history significant for depression, GERD, HTN, sleep apnea, multiple sclerosis and neurogenic bladder who presents today for suprapubic catheter placement. Jesus Sanchez has a chronic suprapubic catheter that is exchanged regularly by urology - last exchange of 22 Fr foley catheter 11/06/19. He reported to urology on 4/28 that his catheter had come out while running errands and he presented to urology to have it replaced, however after several attempts with and without guidewire neither a 22 Fr or 16 Fr foley catheter was able to be placed. An indwelling foley was placed on 4/28 and he has been referred to IR for a new SP cath placement.  Past Medical History:  Diagnosis Date  . Complication of anesthesia    with sedation had to have narcan 2020  . Coronary artery disease   . Depression   . Diabetes mellitus without complication ([place])   . GERD (gastroesophageal reflux disease)   . Hypertension   . MS (multiple sclerosis) ([place]) 10/02/2015  . Neuromuscular disorder ([place])    multiple sclerosis  . Sleep apnea     Past Surgical History:  Procedure Laterality Date  . CHOLECYSTECTOMY N/A 09/02/2016   Procedure: LAPAROSCOPIC CHOLECYSTECTOMY WITH CHOLANGIOGRAM;  Surgeon: Clayburn Pert, MD;  Location: [facility] ORS;  Service: General;  Laterality: N/A;  . CORONARY ANGIOPLASTY     stent 2000  . CYSTOSCOPY WITH URETHRAL DILATATION N/A 09/04/2019   Procedure: CYSTOSCOPY WITH Suprapubic Tract DILATATION;  Surgeon: Abbie Sons, MD;  Location: [facility] ORS;  Service: Urology;  Laterality: N/A;  . HERNIA REPAIR  2014   [facility]- Dr. Marina Gravel  . IR GENERIC HISTORICAL  07/02/2016   IR PERC CHOLECYSTOSTOMY 07/02/2016  Markus Daft, MD [facility]INTER[facility] RAD  . TONSILLECTOMY      Allergies: Iodinated diagnostic agents and Oxycodone-acetaminophen  Medications: Prior to Admission medications   Medication Sig Start Date End Date Taking? Authorizing Provider  aspirin EC 81 MG tablet Take 81 mg by mouth at bedtime.     [provider]  atorvastatin (LIPITOR) 10 MG tablet Take 10 mg by mouth at bedtime.     [provider]  Blood Glucose Monitoring Suppl (ONE TOUCH ULTRA 2) w/Device KIT See admin instructions. 12/26/17   [provider]  citalopram (CELEXA) 40 MG tablet Take 40 mg by mouth daily.  03/03/16   [provider]  ertapenem Colbert Ewing) 1 g injection  07/27/19   [provider]  furosemide (LASIX) 20 MG tablet Take 20 mg by mouth daily as needed (fluid retention.).  08/13/19   [provider]  gabapentin (NEURONTIN) 300 MG capsule Take 900 mg by mouth 3 (three) times daily.  11/29/13   [provider]  ibuprofen (ADVIL,MOTRIN) 200 MG tablet Take 600 mg by mouth every 8 (eight) hours as needed (for pain.).     [provider]  Lancets MISC Use 1 each 2 (two) times daily Use as instructed. ONE TOUCH Dx E11.8 02/05/19 02/05/20  [provider]  losartan (COZAAR) 50 MG tablet Take 50 mg by mouth daily. 07/22/19   [provider]  metFORMIN (GLUCOPHAGE) 500 MG tablet Take 500 mg by mouth daily with breakfast.     [provider]  natalizumab (TYSABRI) 300 MG/15ML injection Inject 300  mg into the vein every 6 (six) weeks. Received at [facility] in [facility] 09/19/15 08/27/20  [provider]  ONE TOUCH ULTRA TEST test strip USE 2 (TWO) TIMES DAILY USE AS INSTRUCTED. 12/26/17   [provider]  ONETOUCH DELICA LANCETS 81O MISC USE 1 EACH 2 (TWO) TIMES DAILY USE AS INSTRUCTED. 12/26/17   [provider]  oxybutynin (DITROPAN) 5 MG tablet Take 5 mg by mouth every 8 (eight) hours as needed (for urinary incontinence).   08/19/15   [provider]  PAIN MANAGEMENT INTRATHECAL, IT, PUMP 1 each by Intrathecal route continuous. Intrathecal (IT) medication:Baclofen    [provider]  pantoprazole (PROTONIX) 40 MG tablet Take 40 mg by mouth 2 (two) times daily.  08/27/15   [provider]     Family History  Problem Relation Age of Onset  . Ulcerative colitis Mother   . Diabetes Mother   . Diabetes Sister   . Colon polyps Sister   . Prostate cancer Neg Hx   . Bladder Cancer Neg Hx   . Kidney cancer Neg Hx     Social History   Socioeconomic History  . Marital status: Married    Spouse name: Not on file  . Number of children: Not on file  . Years of education: Not on file  . Highest education level: Not on file  Occupational History  . Not on file  Tobacco Use  . Smoking status: Former Smoker    Packs/day: 2.00    Years: 25.00    Pack years: 50.00    Quit date: 08/24/1978    Years since quitting: 41.2  . Smokeless tobacco: Former Systems developer    Quit date: 05/20/1979  Substance and Sexual Activity  . Alcohol use: No  . Drug use: No  . Sexual activity: Not Currently  Other Topics Concern  . Not on file  Social History Narrative  . Not on file   Social Determinants of Health   Financial Resource Strain:   . Difficulty of Paying Living Expenses:   Food Insecurity:   . Worried About Charity fundraiser in the Last Year:   . Arboriculturist in the Last Year:   Transportation Needs:   . Film/video editor (Medical):   Marland Kitchen Lack of Transportation (Non-Medical):   Physical Activity:   . Days of Exercise per Week:   . Minutes of Exercise per Session:   Stress:   . Feeling of Stress :   Social Connections:   . Frequency of Communication with Friends and Family:   . Frequency of Social Gatherings with Friends and Family:   . Attends Religious Services:   . Active Member of Clubs or Organizations:   . Attends Archivist Meetings:   Marland Kitchen Marital Status:       Review of Systems: A 12 point ROS discussed and pertinent positives are indicated in the HPI above.  All other systems are negative.  Review of Systems  Constitutional: Negative for chills and fever.  Respiratory: Negative for cough and shortness of breath.   Cardiovascular: Negative for chest pain.  Gastrointestinal: Negative for abdominal pain, diarrhea, nausea and vomiting.  Neurological: Negative for dizziness and headaches.    Vital Signs: There were no vitals taken for this visit.  Physical Exam Vitals reviewed.  Constitutional:      General: He is not in acute distress. HENT:     Head: Normocephalic.     Mouth/Throat:  Mouth: Mucous membranes are moist.     Pharynx: Oropharynx is clear. No oropharyngeal exudate or posterior oropharyngeal erythema.  Cardiovascular:     Rate and Rhythm: Normal rate and regular rhythm.  Pulmonary:     Effort: Pulmonary effort is normal.     Breath sounds: Normal breath sounds.  Abdominal:     General: There is no distension.     Palpations: Abdomen is soft.     Tenderness: There is no abdominal tenderness.  Genitourinary:    Comments: (+) indwelling foley Skin:    General: Skin is warm and dry.  Neurological:     Mental Status: He is alert and oriented to person, place, and time.  Psychiatric:        Mood and Affect: Mood normal.        Behavior: Behavior normal.        Thought Content: Thought content normal.        Judgment: Judgment normal.          Imaging: No results found.  Labs:  CBC: No results for input(s): WBC, HGB, HCT, PLT in the last 8760 hours.  COAGS: No results for input(s): INR, APTT in the last 8760 hours.  BMP: No results for input(s): NA, K, CL, CO2, GLUCOSE, BUN, CALCIUM, CREATININE, GFRNONAA, GFRAA in the last 8760 hours.  Invalid input(s): CMP  LIVER FUNCTION TESTS: No results for input(s): BILITOT, AST, ALT, ALKPHOS, PROT, ALBUMIN in the last 8760 hours.  TUMOR MARKERS: No  results for input(s): AFPTM, CEA, CA199, CHROMGRNA in the last 8760 hours.  Assessment and Plan:  69 y/o M with history of MS and neurogenic bladder with chronic SP catheter who presents today for a new SP catheter placement following inadvertent removal on 4/28 and unsuccessful replacement in urology office that same day.   Patient has been NPO since midnight, no blood thinning medications. Afebrile,  Pre-procedure labs pending and will be reviewed prior to procedure.   Risks and benefits discussed with the patient including bleeding, infection, damage to adjacent structures, and sepsis.  All of the patient's questions were answered, patient is agreeable to proceed.  Consent signed and in chart.  Thank you for this interesting consult.  I greatly enjoyed meeting Jesus Sanchez and look forward to participating in their care.  A copy of this report was sent to the requesting provider on this date.  Electronically Signed: Joaquim Nam, PA-C 11/20/2019, 10:02 AM   I spent a total of 30 Minutes in face to face in clinical consultation, greater than 50% of which was counseling/coordinating care for suprapubic catheter placement.

## 2019-11-20 NOTE — Progress Notes (Signed)
Foley catheter removed intact by A. Jarold Motto, RN. Pt. Tolerated well. Dr. Fredia Sorrow aware of foley removal.

## 2019-11-21 LAB — GLUCOSE, CAPILLARY
Glucose-Capillary: 104 mg/dL — ABNORMAL HIGH (ref 70–99)
Glucose-Capillary: 112 mg/dL — ABNORMAL HIGH (ref 70–99)

## 2019-11-22 LAB — URINE CULTURE: Culture: >=100000 — AB

## 2019-11-26 ENCOUNTER — Telehealth: Payer: Self-pay | Admitting: *Deleted

## 2019-11-26 NOTE — Telephone Encounter (Signed)
Talked with Jesus Sanchez about this patient and she states this may be post drainage. If the area get warm to touch or the patient is have fever or chills. Please call the office and let us know. The patient may take a marker and keep track of the redness to see if the area gets bigger.

## 2019-11-26 NOTE — Telephone Encounter (Signed)
Patient wife call in today and states in the last two day she has notice some yellow discharge coming from the superprapubic drain. She has change the dress two times and the amount is the size of a quarter. The area is red but not warm to touch. Patient Jesus Sanchez is broke and he is not able to come in the office.

## 2019-11-30 ENCOUNTER — Telehealth: Payer: Self-pay | Admitting: Family Medicine

## 2019-11-30 NOTE — Telephone Encounter (Signed)
-----   Message from Harle Battiest, PA-C sent at 11/28/2019 11:12 AM EDT ----- Would you call the Jesus Sanchez and check on the status of the drainage from the SPT site?  They did send a urine culture out at the time of placing the SPT in radiology.  It did grow out a bacteria, but unless Jesus Sanchez is having signs of infection we should not treat.  Patients with indwelling catheters tend to be colonized with bacteria.  This colonization should not be treated with antibiotics as this would lead to further multi drug resistant organisms.

## 2019-11-30 NOTE — Telephone Encounter (Signed)
LMOM for patient to return call.

## 2019-12-03 MED ORDER — SULFAMETHOXAZOLE-TRIMETHOPRIM 800-160 MG PO TABS
1.0000 | ORAL_TABLET | Freq: Two times a day (BID) | ORAL | 0 refills | Status: DC
Start: 2019-12-03 — End: 2020-01-17

## 2019-12-03 NOTE — Telephone Encounter (Signed)
We should go ahead and treat the infection if he is experiencing pain.  Septra DS, BID x 7 days.  His next insertion is June 4th, so we would need to recheck his urine prior to this.  I would have him come to the office on Friday the 28th to submit an urine for UA and culture.

## 2019-12-03 NOTE — Telephone Encounter (Signed)
LMOM for patient to return call to schedule a UA, UCX appointment on Friday May 28. Septra has been sent to [facility]

## 2019-12-03 NOTE — Telephone Encounter (Signed)
Spoke to patient's wife and she states he has complaints of pain and blood in the urine and cloudiness. No fever. She states when he was in the hospital to have the SPT done they gave him Cipro and he is now allergic to that medication.

## 2019-12-04 NOTE — Telephone Encounter (Signed)
Patient notified and appointment for UA drop off has been scheduled. Patient's wife will bring the urine.

## 2019-12-06 ENCOUNTER — Ambulatory Visit: Payer: Self-pay | Admitting: Urology

## 2019-12-13 ENCOUNTER — Other Ambulatory Visit: Payer: Self-pay

## 2019-12-13 DIAGNOSIS — N319 Neuromuscular dysfunction of bladder, unspecified: Secondary | ICD-10-CM

## 2019-12-14 ENCOUNTER — Other Ambulatory Visit: Payer: Self-pay

## 2019-12-14 ENCOUNTER — Encounter: Payer: Self-pay | Admitting: Urology

## 2019-12-20 NOTE — Progress Notes (Signed)
Patient on schedule for SPT upsize 12/21/2019, called and spoke with wife, patient to be NPO after MN as well as get sedation for upsize so driver after discharge. Stated understanding, to be here @ 0730.

## 2019-12-21 ENCOUNTER — Other Ambulatory Visit: Payer: Self-pay

## 2019-12-21 ENCOUNTER — Other Ambulatory Visit: Payer: Self-pay | Admitting: Radiology

## 2019-12-21 ENCOUNTER — Ambulatory Visit
Admission: RE | Admit: 2019-12-21 | Discharge: 2019-12-21 | Disposition: A | Discharge status: Home / Self Care | Payer: Medicare Other | Source: Ambulatory Visit | Attending: Urology | Admitting: Urology

## 2019-12-21 DIAGNOSIS — I2510 Atherosclerotic heart disease of native coronary artery without angina pectoris: Secondary | ICD-10-CM | POA: Diagnosis not present

## 2019-12-21 DIAGNOSIS — E119 Type 2 diabetes mellitus without complications: Secondary | ICD-10-CM | POA: Insufficient documentation

## 2019-12-21 DIAGNOSIS — Z79899 Other long term (current) drug therapy: Secondary | ICD-10-CM | POA: Diagnosis not present

## 2019-12-21 DIAGNOSIS — Z91041 Radiographic dye allergy status: Secondary | ICD-10-CM | POA: Diagnosis not present

## 2019-12-21 DIAGNOSIS — I10 Essential (primary) hypertension: Secondary | ICD-10-CM | POA: Insufficient documentation

## 2019-12-21 DIAGNOSIS — Z7982 Long term (current) use of aspirin: Secondary | ICD-10-CM | POA: Insufficient documentation

## 2019-12-21 DIAGNOSIS — N319 Neuromuscular dysfunction of bladder, unspecified: Secondary | ICD-10-CM | POA: Insufficient documentation

## 2019-12-21 DIAGNOSIS — Z87891 Personal history of nicotine dependence: Secondary | ICD-10-CM | POA: Insufficient documentation

## 2019-12-21 DIAGNOSIS — Z833 Family history of diabetes mellitus: Secondary | ICD-10-CM | POA: Insufficient documentation

## 2019-12-21 DIAGNOSIS — G4730 Sleep apnea, unspecified: Secondary | ICD-10-CM | POA: Insufficient documentation

## 2019-12-21 DIAGNOSIS — K219 Gastro-esophageal reflux disease without esophagitis: Secondary | ICD-10-CM | POA: Diagnosis not present

## 2019-12-21 DIAGNOSIS — Z466 Encounter for fitting and adjustment of urinary device: Secondary | ICD-10-CM | POA: Diagnosis present

## 2019-12-21 DIAGNOSIS — G35 Multiple sclerosis: Secondary | ICD-10-CM | POA: Insufficient documentation

## 2019-12-21 DIAGNOSIS — Z7984 Long term (current) use of oral hypoglycemic drugs: Secondary | ICD-10-CM | POA: Diagnosis not present

## 2019-12-21 DIAGNOSIS — Z881 Allergy status to other antibiotic agents status: Secondary | ICD-10-CM | POA: Insufficient documentation

## 2019-12-21 DIAGNOSIS — Z885 Allergy status to narcotic agent status: Secondary | ICD-10-CM | POA: Diagnosis not present

## 2019-12-21 HISTORY — PX: IR CATHETER TUBE CHANGE: IMG717

## 2019-12-21 MED ORDER — SODIUM CHLORIDE 0.9 % IV SOLN: INTRAVENOUS | Status: DC

## 2019-12-21 MED ORDER — LIDOCAINE VISCOUS HCL 2 % MT SOLN
OROMUCOSAL | Status: AC
  Filled 2019-12-21: qty 15

## 2019-12-21 MED ORDER — MIDAZOLAM HCL 2 MG/2ML IJ SOLN
INTRAMUSCULAR | Status: AC | PRN
  Administered 2019-12-21: 1 mg via INTRAVENOUS

## 2019-12-21 MED ORDER — FENTANYL CITRATE (PF) 100 MCG/2ML IJ SOLN
INTRAMUSCULAR | Status: AC
  Filled 2019-12-21: qty 2

## 2019-12-21 MED ORDER — GENTAMICIN SULFATE 40 MG/ML IJ SOLN
INTRAVENOUS | Status: AC | PRN
  Administered 2019-12-21: 400 mg via INTRAVENOUS

## 2019-12-21 MED ORDER — IODIXANOL 320 MG/ML IV SOLN
10.0000 mL | Freq: Once | INTRAVENOUS | Status: AC | PRN
  Administered 2019-12-21: 10 mL

## 2019-12-21 MED ORDER — FENTANYL CITRATE (PF) 100 MCG/2ML IJ SOLN
INTRAMUSCULAR | Status: AC | PRN
  Administered 2019-12-21: 50 ug via INTRAVENOUS

## 2019-12-21 MED ORDER — GENTAMICIN SULFATE 40 MG/ML IJ SOLN
5.0000 mg/kg | Freq: Once | INTRAVENOUS | Status: DC
  Filled 2019-12-21: qty 10

## 2019-12-21 MED ORDER — MIDAZOLAM HCL 2 MG/2ML IJ SOLN
INTRAMUSCULAR | Status: AC
  Filled 2019-12-21: qty 2

## 2019-12-21 NOTE — Consult Note (Signed)
Chief Complaint: Recurrent UTI  Referring Physician(s): Rulon Sera PA  Supervising Physician: Aletta Edouard  Patient Status: [facility] - Out-pt  History of Present Illness: Jesus Sanchez is a 69 y.o. male History of neurogenic bladder s/p supra pubic catheter placement on 5.4.21  with recurrent UTI. Team is requesting supra pubic cathter upsize. Patient currently has a 14 Fr  Past Medical History:  Diagnosis Date  . Complication of anesthesia    with sedation had to have narcan 2020  . Coronary artery disease   . Depression   . Diabetes mellitus without complication ([facility])    diet controlled  . GERD (gastroesophageal reflux disease)   . Hypertension   . MS (multiple sclerosis) ([place]) 10/02/2015  . Neuromuscular disorder ([place])    multiple sclerosis  . Sleep apnea     Past Surgical History:  Procedure Laterality Date  . CHOLECYSTECTOMY N/A 09/02/2016   Procedure: LAPAROSCOPIC CHOLECYSTECTOMY WITH CHOLANGIOGRAM;  Surgeon: Clayburn Pert, MD;  Location: [facility] ORS;  Service: General;  Laterality: N/A;  . CORONARY ANGIOPLASTY     stent 2000  . CYSTOSCOPY WITH URETHRAL DILATATION N/A 09/04/2019   Procedure: CYSTOSCOPY WITH Suprapubic Tract DILATATION;  Surgeon: Abbie Sons, MD;  Location: [facility] ORS;  Service: Urology;  Laterality: N/A;  . HERNIA REPAIR  2014   [facility]- Dr. Marina Gravel  . IR GENERIC HISTORICAL  07/02/2016   IR PERC CHOLECYSTOSTOMY 07/02/2016 Markus Daft, MD [facility]-INTER[facility] RAD  . suprapubic tube insertion     . TONSILLECTOMY      Allergies: Ciprofloxacin, Iodinated diagnostic agents, and Oxycodone-acetaminophen  Medications: Prior to Admission medications   Medication Sig Start Date End Date Taking? Authorizing Provider  aspirin EC 81 MG tablet Take 81 mg by mouth at bedtime.    Yes [provider]  atorvastatin (LIPITOR) 10 MG tablet Take 10 mg by mouth at bedtime.    Yes [provider]  Blood Glucose Monitoring Suppl (ONE TOUCH ULTRA 2)  w/Device KIT See admin instructions. 12/26/17  Yes [provider]  citalopram (CELEXA) 40 MG tablet Take 40 mg by mouth daily.  03/03/16  Yes [provider]  ertapenem Colbert Ewing) 1 g injection  07/27/19  Yes [provider]  furosemide (LASIX) 20 MG tablet Take 20 mg by mouth daily as needed (fluid retention.).  08/13/19  Yes [provider]  gabapentin (NEURONTIN) 300 MG capsule Take 900 mg by mouth 3 (three) times daily.  11/29/13  Yes [provider]  ibuprofen (ADVIL,MOTRIN) 200 MG tablet Take 600 mg by mouth every 8 (eight) hours as needed (for pain.).    Yes [provider]  Lancets MISC Use 1 each 2 (two) times daily Use as instructed. ONE TOUCH Dx E11.8 02/05/19 02/05/20 Yes [provider]  losartan (COZAAR) 50 MG tablet Take 50 mg by mouth daily. 07/22/19  Yes [provider]  metFORMIN (GLUCOPHAGE) 500 MG tablet Take 500 mg by mouth daily with breakfast.    Yes [provider]  natalizumab (TYSABRI) 300 MG/15ML injection Inject 300 mg into the vein every 6 (six) weeks. Received at [facility] in [facility] 09/19/15 08/27/20 Yes [provider]  ONE TOUCH ULTRA TEST test strip USE 2 (TWO) TIMES DAILY USE AS INSTRUCTED. 12/26/17  Yes [provider]  ONETOUCH DELICA LANCETS 00P MISC USE 1 EACH 2 (TWO) TIMES DAILY USE AS INSTRUCTED. 12/26/17  Yes [provider]  oxybutynin (DITROPAN) 5 MG tablet Take 5 mg by mouth every 8 (eight) hours  as needed (for urinary incontinence).  08/19/15  Yes [provider]  PAIN MANAGEMENT INTRATHECAL, IT, PUMP 1 each by Intrathecal route continuous. Intrathecal (IT) medication:Baclofen   Yes [provider]  pantoprazole (PROTONIX) 40 MG tablet Take 40 mg by mouth 2 (two) times daily.  08/27/15  Yes [provider]  sulfamethoxazole-trimethoprim (BACTRIM DS) 800-160 MG tablet Take 1 tablet by mouth every 12 (twelve) hours. 12/03/19  Yes McGowan,  Hunt Oris, PA-C     Family History  Problem Relation Age of Onset  . Ulcerative colitis Mother   . Diabetes Mother   . Diabetes Sister   . Colon polyps Sister   . Prostate cancer Neg Hx   . Bladder Cancer Neg Hx   . Kidney cancer Neg Hx     Social History   Socioeconomic History  . Marital status: Married    Spouse name: Not on file  . Number of children: Not on file  . Years of education: Not on file  . Highest education level: Not on file  Occupational History  . Not on file  Tobacco Use  . Smoking status: Former Smoker    Packs/day: 2.00    Years: 25.00    Pack years: 50.00    Quit date: 08/24/1978    Years since quitting: 41.3  . Smokeless tobacco: Former Systems developer    Quit date: 05/20/1979  Substance and Sexual Activity  . Alcohol use: No  . Drug use: No  . Sexual activity: Not Currently  Other Topics Concern  . Not on file  Social History Narrative  . Not on file   Social Determinants of Health   Financial Resource Strain:   . Difficulty of Paying Living Expenses:   Food Insecurity:   . Worried About Charity fundraiser in the Last Year:   . Arboriculturist in the Last Year:   Transportation Needs:   . Film/video editor (Medical):   Marland Kitchen Lack of Transportation (Non-Medical):   Physical Activity:   . Days of Exercise per Week:   . Minutes of Exercise per Session:   Stress:   . Feeling of Stress :   Social Connections:   . Frequency of Communication with Friends and Family:   . Frequency of Social Gatherings with Friends and Family:   . Attends Religious Services:   . Active Member of Clubs or Organizations:   . Attends Archivist Meetings:   Marland Kitchen Marital Status:       Review of Systems: A 12 point ROS discussed and pertinent positives are indicated in the HPI above.  All other systems are negative.  Review of Systems  Constitutional: Negative for fever.  HENT: Negative for congestion.   Respiratory: Negative for cough and shortness of  breath.   Cardiovascular: Negative for chest pain.  Gastrointestinal: Negative for abdominal pain.  Neurological: Negative for headaches.  Psychiatric/Behavioral: Negative for behavioral problems and confusion.    Vital Signs: BP (!) 178/82   Pulse 61   Temp 97.7 F (36.5 C) (Oral)   Resp 20   Ht _0  (1.778 m)   Wt 178 lb (80.7 kg)   SpO2 98%   BMI 25.54 kg/m   Physical Exam Vitals and nursing note reviewed.  Constitutional:      Appearance: He is well-developed.  HENT:     Head: Normocephalic.  Cardiovascular:     Rate and Rhythm: Normal rate and regular rhythm.  Pulmonary:  Effort: Pulmonary effort is normal.     Breath sounds: Normal breath sounds.  Musculoskeletal:        General: Normal range of motion.     Cervical back: Normal range of motion.  Skin:    General: Skin is dry.  Neurological:     Mental Status: He is alert and oriented to person, place, and time.     Imaging: No results found.  Labs:  CBC: Recent Labs    11/20/19 1251  WBC 8.7  HGB 15.0  HCT 43.4  PLT 132*    COAGS: No results for input(s): INR, APTT in the last 8760 hours.  BMP: No results for input(s): NA, K, CL, CO2, GLUCOSE, BUN, CALCIUM, CREATININE, GFRNONAA, GFRAA in the last 8760 hours.  Invalid input(s): CMP  LIVER FUNCTION TESTS: No results for input(s): BILITOT, AST, ALT, ALKPHOS, PROT, ALBUMIN in the last 8760 hours.   Assessment and Plan:  69 y.o, male outpatient. History of neurogenic bladder s/p supra pubic catheter placement on 5.4.21  with recurrent UTI. Team is requesting supra pubic cathter upsize. Patient currently has a 14 Fr  . Pertinent Imaging None   Pertinent IR History 5.4.21 - Supra pubic catheter placement - 14 Fr  Pertinent Allergies Iodinated diagnostic agents percocet  No recent labs and medications are within acceptable parameters.  Patient is afebrile.  Risks and benefits discussed with the patient including bleeding,  infection, damage to adjacent structures, bowel perforation/fistula connection, and sepsis.  All of the patient's questions were answered, patient is agreeable to proceed. Consent signed and in chart.     Thank you for this interesting consult.  I greatly enjoyed meeting ISAIAHS CHANCY and look forward to participating in their care.  A copy of this report was sent to the requesting provider on this date.  Electronically Signed: Avel Peace, NP 12/21/2019, 8:34 AM   I spent a total of  40 Minutes   in face to face in clinical consultation, greater than 50% of which was counseling/coordinating care for supra pubic catheter upsize

## 2019-12-21 NOTE — Discharge Instructions (Signed)
Suprapubic Catheter Replacement  Suprapubic catheter replacement is a procedure to remove an old catheter and insert a new, clean catheter. A suprapubic catheter is a rubber tube that drains urine from the bladder into a collection bag outside the body. The catheter is inserted into the bladder through a small opening in the lower abdomen, near the center of the body, above the pubic bone (suprapubicarea). There is a tiny balloon filled with germ-free (sterile) water on the end of the catheter that is in the bladder. The balloon helps to keep the catheter in place. If you need to wear a catheter for a long period of time, you may be instructed to replace the catheter yourself. Usually, suprapubic catheters need to be replaced every 4-6 weeks, or as often as told by your health care provider. What are the risks? Generally, this is a safe procedure. However, problems may occur, including failure to get the catheter into the bladder. What happens before the procedure?  You may have an exam or testing, including a blood or urine sample.  Ask your health care provider what steps will be taken to help prevent infection. What happens during the procedure?  You will lie on your back.  The water from the balloon will be removed using a syringe.  The catheter will be slowly removed.  Lubricant will be applied to the end of the new catheter that will go into your bladder.  The new catheter will be inserted through the opening in your abdomen. Your health care provider will slide the catheter into your bladder.  Your health care provider will wait for some urine to start flowing through the catheter. When this happens, a syringe will be used to fill the balloon with sterile water.  A collection bag will be attached to the end of the catheter. The procedure may vary among health care providers and hospitals. What can I expect after procedure? After the procedure, it is common to have:  Some  discomfort around the opening in your abdomen. Follow these instructions at home: Caring for the skin around the catheter Use a clean washcloth and soapy water to clean the skin around your catheter every day. Pat the area dry with a clean towel.  Do not pull on the catheter.  Do not use ointment or lotion on this area unless told by your health care provider.  Check the skin around the catheter every day for signs of infection. Check for: ? Redness, swelling, or pain. ? Fluid or blood. ? Warmth. ? Pus or a bad smell. Caring for the catheter  Clean the catheter with soap and water as often as told by your health care provider.  Always make sure there are no twists or curls (kinks) in the catheter.  As soon as you are able to move, you may use a leg bag to collect the urine. ? Make sure that the tubing is straight and without kinks. ? Wrap an ace bandage gently over the tubing and around your leg to minimize the risk of the bag getting pulled out. Emptying the collection bag Empty the large collection bag every 8 hours. Empty the small collection bag when it is about ? full. To empty your large or small collection bag, take the following steps:  Always keep the bag below the level of the catheter. This keeps urine from flowing backward into the catheter.  Hold the bag over the toilet or another container. Turn the valve (spigot) at the bottom of   the bag to empty the urine. ? Do not touch the opening of the spigot. ? Do not let the opening touch the toilet or container.  Close the spigot tightly when the bag is empty. Cleaning the collection bag   Wash your hands with soap and water. If soap and water are not available, use hand sanitizer.  Disconnect the bag from the catheter and immediately attach a new bag to the catheter.  Empty the used bag completely.  Clean the used bag according to the manufacturer's instructions, or as told by your health care provider.  Let the bag  dry completely, and put it in a clean plastic bag before storing it. General instructions   Always wash your hands before and after caring for your catheter and collection bag. Use soap and water. If soap and water are not available, use hand sanitizer.  Always make sure there are no leaks in the catheter or collection bag.  Drink enough fluid to keep your urine pale yellow.  If you were prescribed an antibiotic medicine, take it as told by your health care provider. Do not stop taking the antibiotic even if you start to feel better.  Do not take baths, swim, or use a hot tub.  Keep all follow-up visits as told by your health care provider. This is important. Contact a health care provider if:  You leak urine.  You have redness, swelling, or pain around your catheter opening.  You have fluid or blood coming from your catheter opening.  Your catheter opening feels warm to the touch.  You have pus or a bad smell coming from your catheter opening.  You have a fever or chills.  Your urine flow slows down.  Your urine becomes cloudy or smelly. Get help right away if:  Your catheter comes out.  You feel nauseous.  You have back pain.  You have difficulty changing your catheter.  You have blood in your urine.  You have no urine flow for 1 hour. Summary  Suprapubic catheter replacement is a procedure to remove an old catheter and insert a new, clean catheter.  Make sure that you understand how to care for your catheter, your collection bag, and the opening in your abdomen.  Always wash your hands before and after caring for your catheter and collection bag.  Contact a health care provider if you leak urine or have a fever or any signs of infection around the catheter opening, or if your urine becomes cloudy or smelly.  Get help right away if your catheter comes out or you have nausea, back pain, blood in your urine, no urine flow for 1 hour, or difficulty changing your  catheter. This information is not intended to replace advice given to you by your health care provider. Make sure you discuss any questions you have with your health care provider. Document Revised: 02/22/2018 Document Reviewed: 02/22/2018 Elsevier Patient Education  2020 [employer]  

## 2020-01-16 NOTE — Progress Notes (Signed)
Suprapubic Cath Change  Patient is present today for a suprapubic catheter change due to urinary retention.  5 ml of water was drained from the balloon, a 16 FR council tip foley cath was removed from the tract with out difficulty.  Site was cleaned and prepped in a sterile fashion with betadine.  A 16 FR foley cath was replaced into the tract no complications were noted. Urine return was noted, 10 ml of sterile water was inflated into the balloon and an overnight bag was attached for drainage.  Patient tolerated well. A night bag was given to patient and proper instruction was given on how to switch bags.    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  He has a very large defect and when palpated I detect bowel.  It is not painful to palpation.  He does work status been present for 2 to 3 weeks.  He has not had any change in his bowel movements.  He states he has had several surgeries in that area.  Will obtain a CT scan for further evaluation  I spent 15 minutes on the day of the encounter to include pre-visit record review, face-to-face time with the patient, and post-visit ordering of tests.Marland Kitchen

## 2020-01-17 ENCOUNTER — Telehealth: Payer: Self-pay | Admitting: Urology

## 2020-01-17 ENCOUNTER — Ambulatory Visit (INDEPENDENT_AMBULATORY_CARE_PROVIDER_SITE_OTHER): Payer: Medicare Other | Admitting: Urology

## 2020-01-17 ENCOUNTER — Other Ambulatory Visit: Payer: Self-pay

## 2020-01-17 DIAGNOSIS — N319 Neuromuscular dysfunction of bladder, unspecified: Secondary | ICD-10-CM | POA: Diagnosis not present

## 2020-01-17 DIAGNOSIS — K439 Ventral hernia without obstruction or gangrene: Secondary | ICD-10-CM | POA: Diagnosis not present

## 2020-01-27 NOTE — Telephone Encounter (Signed)
Error

## 2020-02-07 ENCOUNTER — Ambulatory Visit
Admission: RE | Admit: 2020-02-07 | Discharge: 2020-02-07 | Disposition: A | Discharge status: Home / Self Care | Payer: Medicare Other | Source: Ambulatory Visit | Attending: Urology | Admitting: Urology

## 2020-02-07 ENCOUNTER — Other Ambulatory Visit: Payer: Self-pay

## 2020-02-07 ENCOUNTER — Telehealth: Payer: Self-pay | Admitting: Family Medicine

## 2020-02-07 DIAGNOSIS — K439 Ventral hernia without obstruction or gangrene: Secondary | ICD-10-CM | POA: Insufficient documentation

## 2020-02-07 NOTE — Telephone Encounter (Signed)
-----   Message from Harle Battiest, PA-C sent at 02/07/2020  3:44 PM EDT ----- Please let Mr. Hanning know that his abdominal muscles are weak in that area on the right that we were concerned about.  There was not hernia.

## 2020-02-07 NOTE — Telephone Encounter (Signed)
Patient notified and voiced understanding.

## 2020-02-20 NOTE — Progress Notes (Deleted)
Suprapubic Cath Change  Patient is present today for a suprapubic catheter change due to urinary retention.  ***ml of water was drained from the balloon, a ***FR foley cath was removed from the tract with out difficulty.  Site was cleaned and prepped in a sterile fashion with betadine.  A ***FR foley cath was replaced into the tract {dnt complications:20057}. Urine return was noted, 10 ml of sterile water was inflated into the balloon and a *** bag was attached for drainage.  Patient tolerated well. A night bag was given to patient and proper instruction was given on how to switch bags.    Preformed by: ***  Follow up: ***  RUS and cysto 08/2020

## 2020-02-21 ENCOUNTER — Ambulatory Visit: Payer: Self-pay | Admitting: Urology

## 2020-02-22 ENCOUNTER — Encounter: Payer: Self-pay | Admitting: Urology

## 2020-02-24 ENCOUNTER — Telehealth: Payer: Self-pay | Admitting: Urology

## 2020-02-24 NOTE — Telephone Encounter (Signed)
Would you call Jesus Sanchez and have him reschedule his missed appointment for his SPT exchange?

## 2020-02-25 NOTE — Progress Notes (Signed)
Suprapubic Cath Change  Patient is present today for a suprapubic catheter change due to urinary retention.  8 ml of water was drained from the balloon, a 16 FR foley cath was removed from the tract with out difficulty.  Site was cleaned and prepped in a sterile fashion with betadine.  A 16 FR foley cath was replaced into the tract no complications were noted. Urine return was noted, 10 ml of sterile water was inflated into the balloon and an overnight bag was attached for drainage.  Patient tolerated well. A night bag was given to patient and proper instruction was given on how to switch bags.    Performed by: Michiel Cowboy, PA-C  Follow up: One month for SPT exchange.  RUS and cysto 08/2020

## 2020-02-26 ENCOUNTER — Ambulatory Visit (INDEPENDENT_AMBULATORY_CARE_PROVIDER_SITE_OTHER): Payer: Medicare Other | Admitting: Urology

## 2020-02-26 ENCOUNTER — Encounter: Payer: Self-pay | Admitting: Urology

## 2020-02-26 ENCOUNTER — Other Ambulatory Visit: Payer: Self-pay

## 2020-02-26 VITALS — BP 184/82 | HR 62

## 2020-02-26 DIAGNOSIS — N319 Neuromuscular dysfunction of bladder, unspecified: Secondary | ICD-10-CM | POA: Diagnosis not present

## 2020-04-02 ENCOUNTER — Ambulatory Visit: Payer: Self-pay | Admitting: Urology

## 2020-04-03 ENCOUNTER — Ambulatory Visit: Payer: Self-pay | Admitting: Urology

## 2020-04-09 NOTE — Progress Notes (Signed)
Suprapubic Cath Change  Patient is present today for a suprapubic catheter change due to urinary retention.  8 ml of water was drained from the balloon, a 16 FR foley cath was removed from the tract with out difficulty.  Site was cleaned and prepped in a sterile fashion with betadine.  A 16 FR foley cath was replaced into the tract no complications were noted. Urine return was noted, 10 ml of sterile water was inflated into the balloon and an overnight bag was attached for drainage.  Patient tolerated well.   Performed by: Michiel Cowboy, PA-C and Gerarda Gunther, CMA  Follow up: One month for SPT exchange   Patient suffered a skin laceration last night for which they would like [place] to take a look at.  On the right knee, there is a small area (nickle-size) avulsion.  The tissue is pink and healthy and not actively bleeding.  We applied triple antibiotic ointment, non-stick guaze and secured with Coban.

## 2020-04-10 ENCOUNTER — Other Ambulatory Visit: Payer: Self-pay

## 2020-04-10 ENCOUNTER — Ambulatory Visit (INDEPENDENT_AMBULATORY_CARE_PROVIDER_SITE_OTHER): Payer: Medicare Other | Admitting: Urology

## 2020-04-10 DIAGNOSIS — T83010A Breakdown (mechanical) of cystostomy catheter, initial encounter: Secondary | ICD-10-CM

## 2020-04-14 ENCOUNTER — Encounter (INDEPENDENT_AMBULATORY_CARE_PROVIDER_SITE_OTHER): Payer: Self-pay | Admitting: Vascular Surgery

## 2020-04-14 ENCOUNTER — Encounter (INDEPENDENT_AMBULATORY_CARE_PROVIDER_SITE_OTHER): Payer: Self-pay

## 2020-05-12 ENCOUNTER — Other Ambulatory Visit: Payer: Self-pay

## 2020-05-12 ENCOUNTER — Ambulatory Visit (INDEPENDENT_AMBULATORY_CARE_PROVIDER_SITE_OTHER): Payer: Medicare Other | Admitting: Urology

## 2020-05-12 DIAGNOSIS — T83010A Breakdown (mechanical) of cystostomy catheter, initial encounter: Secondary | ICD-10-CM

## 2020-05-12 DIAGNOSIS — R339 Retention of urine, unspecified: Secondary | ICD-10-CM | POA: Diagnosis not present

## 2020-05-12 NOTE — Progress Notes (Signed)
Suprapubic Cath Change  Patient is present today for a suprapubic catheter change due to urinary retention.  9 ml of water was drained from the balloon, a 16 FR foley cath was removed from the tract with out difficulty.  Site was cleaned and prepped in a sterile fashion with betadine.  A 16 FR foley cath was replaced into the tract no complications were noted. Urine return was noted, 10 ml of sterile water was inflated into the balloon and a night bag was given to patient and proper instruction was given on how to switch bags.    Performed by: Michiel Cowboy, PA-C  Follow up: One month for SPT exchange

## 2020-06-08 NOTE — Progress Notes (Signed)
Suprapubic Cath Change  Patient is present today for a suprapubic catheter change due to urinary retention.  9 ml of water was drained from the balloon, a 16 FR foley cath was removed from the tract with out difficulty.  Site was cleaned and prepped in a sterile fashion with betadine.  A 16 FR foley cath was replaced into the tract no complications were noted. Urine return was noted, 10 ml of sterile water was inflated into the balloon and a night bag was attached for drainage.  Patient tolerated well.   Performed by: Michiel Cowboy, PA-C and Eligha Bridegroom, CMA   Follow up: One month for SPT exchange

## 2020-06-09 ENCOUNTER — Other Ambulatory Visit: Payer: Self-pay

## 2020-06-09 ENCOUNTER — Ambulatory Visit (INDEPENDENT_AMBULATORY_CARE_PROVIDER_SITE_OTHER): Payer: Medicare Other | Admitting: Urology

## 2020-06-09 DIAGNOSIS — N319 Neuromuscular dysfunction of bladder, unspecified: Secondary | ICD-10-CM | POA: Diagnosis not present

## 2020-06-09 DIAGNOSIS — T83010A Breakdown (mechanical) of cystostomy catheter, initial encounter: Secondary | ICD-10-CM

## 2020-07-06 NOTE — Progress Notes (Signed)
Suprapubic Cath Change  Patient is present today for a suprapubic catheter change due to urinary retention.  10 ml of water was drained from the balloon, a 16 FR foley cath was removed from the tract with out difficulty.  Site was cleaned and prepped in a sterile fashion with betadine.  A 16 FR foley cath was replaced into the tract no complications were noted. Urine return was noted, 10 ml of sterile water was inflated into the balloon and a night bag was attached for drainage.  Patient tolerated well.     Performed by:  Michiel Cowboy, PA-C and Mervin Hack, CMA  Follow up: one month for SPT exchange

## 2020-07-07 ENCOUNTER — Encounter: Payer: Self-pay | Admitting: Urology

## 2020-07-07 ENCOUNTER — Other Ambulatory Visit: Payer: Self-pay

## 2020-07-07 ENCOUNTER — Ambulatory Visit: Payer: Medicare Other | Admitting: Urology

## 2020-07-07 VITALS — BP 161/83 | HR 56 | Ht 70.0 in | Wt 180.0 lb

## 2020-07-07 DIAGNOSIS — T83010A Breakdown (mechanical) of cystostomy catheter, initial encounter: Secondary | ICD-10-CM

## 2020-08-18 ENCOUNTER — Ambulatory Visit (INDEPENDENT_AMBULATORY_CARE_PROVIDER_SITE_OTHER): Payer: Medicare Other | Admitting: Urology

## 2020-08-18 ENCOUNTER — Encounter: Payer: Self-pay | Admitting: Urology

## 2020-08-18 ENCOUNTER — Other Ambulatory Visit: Payer: Self-pay

## 2020-08-18 VITALS — BP 159/81 | HR 59 | Ht 69.0 in | Wt 180.0 lb

## 2020-08-18 DIAGNOSIS — T83010A Breakdown (mechanical) of cystostomy catheter, initial encounter: Secondary | ICD-10-CM

## 2020-08-18 NOTE — Progress Notes (Signed)
Suprapubic Cath Change  Patient is present today for a suprapubic catheter change due to urinary retention.  8 ml of water was drained from the balloon, a 16 FR foley cath was removed from the tract with out difficulty.  Site was cleaned and prepped in a sterile fashion with betadine.  A 16 FR foley cath was replaced into the tract no complications were noted. Urine return was noted, 10 ml of sterile water was inflated into the balloon and a nihgt bag was attached for drainage.  Patient tolerated well.   Performed by: Michiel Cowboy, PA-C and Mervin Hack, CMA  Follow up: One month for SPT exchange

## 2020-08-28 ENCOUNTER — Other Ambulatory Visit: Payer: Self-pay | Admitting: Urology

## 2020-09-14 NOTE — Progress Notes (Signed)
Suprapubic Cath Change  Patient is present today for a suprapubic catheter change due to urinary retention.  8 ml of water was drained from the balloon, a 16 FR foley cath was removed from the tract with out difficulty.  Site was cleaned and prepped in a sterile fashion with betadine.  A 16 FR foley cath was replaced into the tract no complications were noted. Urine return was noted, 10 ml of sterile water was inflated into the balloon and an overnight bag was attached for drainage.  Patient tolerated well.    Performed by: Michiel Cowboy, PA-C  Follow up: One month for SPT exchange   During catheter change, I noticed an small circular area of erythema.  Mr. Bellanca stated his wife pulled a tick off from that area three days ago.  I have sent in a script for doxycycline 100 mg, twice daily x 10 days to treat any potential Lyme disease or [facility] fever.   He has not had any fevers or chills since the tick bite.    I spent 15 minutes on the day of the encounter to include pre-visit record review, face-to-face time with the patient, and post-visit ordering of tests.

## 2020-09-15 ENCOUNTER — Ambulatory Visit (INDEPENDENT_AMBULATORY_CARE_PROVIDER_SITE_OTHER): Payer: Medicare Other | Admitting: Urology

## 2020-09-15 ENCOUNTER — Other Ambulatory Visit: Payer: Self-pay | Admitting: *Deleted

## 2020-09-15 ENCOUNTER — Other Ambulatory Visit: Payer: Self-pay

## 2020-09-15 ENCOUNTER — Encounter: Payer: Self-pay | Admitting: Urology

## 2020-09-15 ENCOUNTER — Other Ambulatory Visit
Admission: RE | Admit: 2020-09-15 | Discharge: 2020-09-15 | Disposition: A | Discharge status: Home / Self Care | Payer: Medicare Other | Attending: Urology | Admitting: Urology

## 2020-09-15 VITALS — BP 150/81 | HR 59 | Ht 70.0 in | Wt 180.0 lb

## 2020-09-15 DIAGNOSIS — S30861A Insect bite (nonvenomous) of abdominal wall, initial encounter: Secondary | ICD-10-CM | POA: Diagnosis not present

## 2020-09-15 DIAGNOSIS — N319 Neuromuscular dysfunction of bladder, unspecified: Secondary | ICD-10-CM | POA: Insufficient documentation

## 2020-09-15 DIAGNOSIS — T83010A Breakdown (mechanical) of cystostomy catheter, initial encounter: Secondary | ICD-10-CM | POA: Diagnosis not present

## 2020-09-15 LAB — PSA: Prostatic Specific Antigen: 0.96 ng/mL (ref 0.00–4.00)

## 2020-09-15 MED ORDER — DOXYCYCLINE HYCLATE 100 MG PO CAPS
100.0000 mg | ORAL_CAPSULE | Freq: Two times a day (BID) | ORAL | 0 refills | Status: DC
Start: 2020-09-15 — End: 2020-10-13

## 2020-09-15 NOTE — Addendum Note (Signed)
Addended by: Sueanne Margarita on: 09/15/2020 12:47 PM   Modules accepted: Orders

## 2020-09-16 ENCOUNTER — Telehealth: Payer: Self-pay | Admitting: Family Medicine

## 2020-09-16 NOTE — Telephone Encounter (Signed)
Patient notified and voiced understanding.

## 2020-09-16 NOTE — Telephone Encounter (Signed)
-----   Message from Harle Battiest, PA-C sent at 09/15/2020  9:52 PM EST ----- Please let Mr. Formanek know that his PSA is normal.

## 2020-10-06 ENCOUNTER — Telehealth: Payer: Self-pay

## 2020-10-10 NOTE — Progress Notes (Signed)
Suprapubic Cath Change Patient is present today for a suprapubic catheter change due to urinary retention.  9 ml of water was drained from the balloon, a 16 FR foley cath was removed from the tract with out difficulty.  Site was cleaned and prepped in a sterile fashion with betadine.  An 1 FR council tip foley cath was replaced (it was the only available 69fr in [place] available to me) into the tract no complications were noted.  I placed an 65fr as he was having issues with sediment clogging his 24fr.  Urine return was noted, 10 ml of sterile water was inflated into the balloon and an overnight bag was attached for drainage.  Patient tolerated well.   Performed by: Michiel Cowboy, PA-C  Follow up: One month for SPT exchange

## 2020-10-13 ENCOUNTER — Encounter: Payer: Self-pay | Admitting: Urology

## 2020-10-13 ENCOUNTER — Other Ambulatory Visit: Payer: Self-pay

## 2020-10-13 ENCOUNTER — Ambulatory Visit (INDEPENDENT_AMBULATORY_CARE_PROVIDER_SITE_OTHER): Payer: Medicare Other | Admitting: Urology

## 2020-10-13 VITALS — BP 168/91 | HR 83 | Ht 70.0 in | Wt 179.0 lb

## 2020-10-13 DIAGNOSIS — T83010A Breakdown (mechanical) of cystostomy catheter, initial encounter: Secondary | ICD-10-CM

## 2020-10-13 DIAGNOSIS — N319 Neuromuscular dysfunction of bladder, unspecified: Secondary | ICD-10-CM

## 2020-11-04 ENCOUNTER — Other Ambulatory Visit: Payer: Self-pay

## 2020-11-04 ENCOUNTER — Ambulatory Visit (INDEPENDENT_AMBULATORY_CARE_PROVIDER_SITE_OTHER): Payer: Medicare Other | Admitting: Physician Assistant

## 2020-11-04 DIAGNOSIS — T83010A Breakdown (mechanical) of cystostomy catheter, initial encounter: Secondary | ICD-10-CM

## 2020-11-04 DIAGNOSIS — R339 Retention of urine, unspecified: Secondary | ICD-10-CM

## 2020-11-04 NOTE — Progress Notes (Signed)
Suprapubic Cath Change  Patient is present today for a suprapubic catheter change due to urinary retention.  73ml of water was drained from the balloon, a 16FR foley cath was removed from the tract without difficulty.  Site was cleaned and prepped in a sterile fashion with betadine.  A 18FR silicone foley cath was replaced into the tract no complications were noted. Urine return was noted, 10 ml of sterile water was inflated into the balloon and a night bag was attached for drainage.  Patient tolerated well.  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 She has been irrigating his SPT at home with sterile saline. Counseled her to switch to vinegar bladder instillations twice daily; mixing instructions provided today. May consider Renacidin in the future if he continues to have encrustation issues.  Follow up: Return in about 4 weeks (around 12/02/2020) for SPT exchange.

## 2020-11-04 NOTE — Patient Instructions (Signed)
Vinegar Bladder Irrigation Protocol  Please start irrigating your bladder with a vinegar solution as described below to reduce urinary encrustation.  Most grocery stores carry white vinegar as a 5% solution. To make an appropriate bladder irrigation solution, you will need to dilute this at a ratio of roughly 20:1.  To achieve this, see the chart below to determine what amount of 5% white vinegar solution you should mix with your normal bladder irrigation (homemade saline or sterile saline from the pharmacy).  If you are starting with a saline volume of... ...250mL, then add 2.5 tsp (12.5mL) of white vinegar ...500mL, then add 5 tsp (25mL) of white vinegar ...1000mL, then add 10 tsp (50mL) of white vinegar ...1 gallon, then add about 6 oz of white vinegar  Instill 30mL of the vinegar solution through your catheter into the bladder three times daily. Leave the solution in the bladder for 10 minutes each time, then drain. Discontinue irrigation if it causes pain or discomfort. You may store any leftover solution in the refrigerator for up to 1 week.  

## 2020-11-04 NOTE — Telephone Encounter (Signed)
error 

## 2020-11-10 ENCOUNTER — Ambulatory Visit: Payer: Medicare Other | Admitting: Urology

## 2020-11-25 ENCOUNTER — Telehealth: Payer: Self-pay

## 2020-11-25 NOTE — Telephone Encounter (Signed)
Incoming message on triage voicemail from patients wife asking to speak to someone about patient, no other details. Returned her call, [facility] for her to return call.

## 2020-11-26 ENCOUNTER — Telehealth: Payer: Self-pay | Admitting: Family Medicine

## 2020-11-26 NOTE — Telephone Encounter (Signed)
Patient went to [facility] for vomiting and not responsive well. He had a UTI and it was Sepsis. He was in for 5 days. They had him on IV ABX and is currently still on ABX. His wife noticed blood in the bag on Monday and it small clots every now and then. She states it cleared up during the day yesterday and this morning the blood is back. She states urine is flowing well in the bag. She wants to be sure this is ok or if she will need to do anything. She is hoping she does not need to come in to the office. The blood is not dark as [place] it's just dark red and not bright red. Please advise

## 2020-11-26 NOTE — Telephone Encounter (Signed)
As long as the blood in the urine is off and on with clearing in between and he does not clot off, he should be fine to wait until the 16th for his appointment with [place].  If he should start experiencing fever, fatigue or the blood remains consistent, please have her call the office or seek treatment in the ED.  I assume he has enough of the antibiotic to last until his appointment with me on the 16th, correct?

## 2020-11-26 NOTE — Telephone Encounter (Signed)
Spoke to wife and gave message below. She voiced understanding.

## 2020-11-30 NOTE — Progress Notes (Deleted)
Suprapubic Cath Change  Patient is present today for a suprapubic catheter change due to urinary retention.  ***ml of water was drained from the balloon, a ***FR foley cath was removed from the tract with out difficulty.  Site was cleaned and prepped in a sterile fashion with betadine.  A ***FR foley cath was replaced into the tract {dnt complications:20057}. Urine return was noted, 10 ml of sterile water was inflated into the balloon and a *** bag was attached for drainage.  Patient tolerated well. A night bag was given to patient and proper instruction was given on how to switch bags.    Preformed by: ***  Follow up: ***  

## 2020-12-01 ENCOUNTER — Ambulatory Visit: Payer: Medicare Other | Admitting: Urology

## 2020-12-01 DIAGNOSIS — T83010A Breakdown (mechanical) of cystostomy catheter, initial encounter: Secondary | ICD-10-CM

## 2021-01-09 ENCOUNTER — Other Ambulatory Visit: Payer: Self-pay

## 2021-01-09 ENCOUNTER — Ambulatory Visit (INDEPENDENT_AMBULATORY_CARE_PROVIDER_SITE_OTHER): Payer: Medicare Other | Admitting: Urology

## 2021-01-09 DIAGNOSIS — N319 Neuromuscular dysfunction of bladder, unspecified: Secondary | ICD-10-CM | POA: Diagnosis not present

## 2021-01-09 DIAGNOSIS — N390 Urinary tract infection, site not specified: Secondary | ICD-10-CM

## 2021-01-09 LAB — URINALYSIS, COMPLETE
Bilirubin, UA: NEGATIVE
Ketones, UA: NEGATIVE
Nitrite, UA: NEGATIVE
Specific Gravity, UA: 1.01 (ref 1.005–1.030)
Urobilinogen, Ur: 0.2 mg/dL (ref 0.2–1.0)
pH, UA: 6 (ref 5.0–7.5)

## 2021-01-09 LAB — MICROSCOPIC EXAMINATION: Bacteria, UA: NONE SEEN

## 2021-01-09 MED ORDER — NITROFURANTOIN MONOHYD MACRO 100 MG PO CAPS: 100.0000 mg | ORAL_CAPSULE | Freq: Every day | ORAL | 0 refills | Status: DC

## 2021-01-09 NOTE — Progress Notes (Signed)
Suprapubic Cath Change  Patient is present today for a suprapubic catheter change due to urinary retention.  6 ml of water was drained from the balloon, a 18 FR coude foley cath was removed from the tract with out difficulty.  Site was cleaned and prepped in a sterile fashion with betadine.  A 20 FR foley cath was replaced into the tract no complications were noted. Urine return was noted, 10 ml of sterile water was inflated into the balloon and a night bag was attached for drainage.  Patient tolerated well.   Performed by: Quintella Reichert, PA-S and Michiel Cowboy, PA-C   Follow up: One month for SPT exchange.  Patient and wife are concerned about rUTI's causing sepsis.  We discussed starting a prophylactic antibiotic, the risk and benefits, and I have sent in a script for Macrobid 100 mg daily for him.

## 2021-01-12 ENCOUNTER — Telehealth: Payer: Self-pay | Admitting: Urology

## 2021-01-12 LAB — CULTURE, URINE COMPREHENSIVE

## 2021-01-12 NOTE — Telephone Encounter (Signed)
Spoke with patient's wife and advised results  

## 2021-01-12 NOTE — Telephone Encounter (Signed)
Please let Jesus Sanchez know that his urine culture was negative.

## 2021-02-13 NOTE — Progress Notes (Addendum)
Suprapubic Cath Change  Patient is present today for a suprapubic catheter change due to urinary retention.  9 ml of water was drained from the balloon, a 20 FR foley cath was removed from the tract with out difficulty.  Site was cleaned and prepped in a sterile fashion with betadine.  An 68 FR foley cath was replaced into the tract no complications were noted. Urine return was noted, 10 ml of sterile water was inflated into the balloon and a night bag was attached for drainage.  Patient tolerated well.   Performed by: Michiel Cowboy, PA-C and DeShannon Maggie Schwalbe, CMA  Follow up: He has been taking the nitrofurantoin 100 mg daily.  He has been experiencing dark colored urine recently.  No other urinary symptoms.  Granulation tissue around the SPT site was addressed today with silver nitrate as this may be the source of the dark-colored urine.   KUB in the next few days to evaluate for possible bladder stones as well.

## 2021-02-16 ENCOUNTER — Ambulatory Visit (INDEPENDENT_AMBULATORY_CARE_PROVIDER_SITE_OTHER): Payer: Medicare Other | Admitting: Urology

## 2021-02-16 ENCOUNTER — Encounter: Payer: Self-pay | Admitting: Urology

## 2021-02-16 ENCOUNTER — Other Ambulatory Visit: Payer: Self-pay

## 2021-02-16 VITALS — BP 143/80 | HR 74 | Ht 69.75 in | Wt 185.0 lb

## 2021-02-16 DIAGNOSIS — N319 Neuromuscular dysfunction of bladder, unspecified: Secondary | ICD-10-CM | POA: Diagnosis not present

## 2021-02-16 DIAGNOSIS — N390 Urinary tract infection, site not specified: Secondary | ICD-10-CM

## 2021-03-13 NOTE — Progress Notes (Signed)
Suprapubic Cath Change  Patient is present today for a suprapubic catheter change due to urinary retention.  9 ml of water was drained from the balloon, a 18 FR foley cath was removed from the tract with out difficulty.  Site was cleaned and prepped in a sterile fashion with betadine.  A 18 FR foley cath was replaced into the tract no complications were noted. Urine return was noted, 10 ml of sterile water was inflated into the balloon and a night bag was attached for drainage.  Patient tolerated well.   Performed by: Michiel Cowboy, PA-C   Follow up:  Patient has been having foul smelling urine which he states is a precursor to UTI's.  He will continue the nitrofurantoin 100 mg daily.  UA > 50 WBC's, > 50 RBC's and few bacteria.  Urine sent for culture.  Cysto with Dr. Lonna Cobb in one month

## 2021-03-16 ENCOUNTER — Other Ambulatory Visit: Payer: Self-pay

## 2021-03-16 ENCOUNTER — Other Ambulatory Visit
Admission: RE | Admit: 2021-03-16 | Discharge: 2021-03-16 | Disposition: A | Discharge status: Home / Self Care | Payer: Medicare Other | Source: Ambulatory Visit | Attending: Urology | Admitting: Urology

## 2021-03-16 ENCOUNTER — Ambulatory Visit (INDEPENDENT_AMBULATORY_CARE_PROVIDER_SITE_OTHER): Payer: Medicare Other | Admitting: Urology

## 2021-03-16 ENCOUNTER — Encounter: Payer: Self-pay | Admitting: Urology

## 2021-03-16 VITALS — BP 160/80 | HR 74 | Ht 70.0 in | Wt 183.0 lb

## 2021-03-16 DIAGNOSIS — T83010A Breakdown (mechanical) of cystostomy catheter, initial encounter: Secondary | ICD-10-CM | POA: Diagnosis present

## 2021-03-16 DIAGNOSIS — R8290 Unspecified abnormal findings in urine: Secondary | ICD-10-CM | POA: Diagnosis present

## 2021-03-16 LAB — URINALYSIS, COMPLETE (UACMP) WITH MICROSCOPIC
Bilirubin Urine: NEGATIVE
Glucose, UA: NEGATIVE mg/dL
Ketones, ur: NEGATIVE mg/dL
Nitrite: NEGATIVE
Protein, ur: 100 mg/dL — AB
RBC / HPF: >50 RBC/hpf (ref 0–5)
Specific Gravity, Urine: 1.02 (ref 1.005–1.030)
WBC, UA: >50 WBC/hpf (ref 0–5)
pH: 8.5 — ABNORMAL HIGH (ref 5.0–8.0)

## 2021-03-19 ENCOUNTER — Other Ambulatory Visit: Payer: Self-pay | Admitting: Urology

## 2021-03-19 ENCOUNTER — Telehealth: Payer: Self-pay | Admitting: Family Medicine

## 2021-03-19 DIAGNOSIS — Z1624 Resistance to multiple antibiotics: Secondary | ICD-10-CM

## 2021-03-19 LAB — URINE CULTURE: Culture: >=100000 — AB

## 2021-03-19 NOTE — Telephone Encounter (Signed)
-----   Message from Harle Battiest, PA-C sent at 03/19/2021  8:17 AM EDT ----- Please let Jesus Sanchez know that his urine culture grew out two highly resistant organisms.  I will need to refer to ID at this time.

## 2021-03-19 NOTE — Telephone Encounter (Signed)
LMOM for patient to return call.

## 2021-03-19 NOTE — Telephone Encounter (Signed)
Patient's wife notified and has requested we change the referral to [facility]. Carollee Herter has made the change. The urine culture result has been faxed to Dr. Elwyn Reach patient's Neurologist at [facility].

## 2021-03-20 NOTE — Telephone Encounter (Signed)
Patient's wife called again today and wanted clarification on the referral. I informed her that [facility] will be reaching out to them to set up the appointment to start the IV abx. She will wait on call from [facility] She is to contact our office if there are any problems with getting ID appointment.

## 2021-03-26 ENCOUNTER — Ambulatory Visit: Payer: Medicare Other | Attending: Infectious Diseases | Admitting: Infectious Diseases

## 2021-03-26 ENCOUNTER — Other Ambulatory Visit: Payer: Self-pay

## 2021-03-26 VITALS — BP 176/84 | HR 76 | Temp 98.1°F | Resp 16 | Ht 70.0 in | Wt 183.0 lb

## 2021-03-26 DIAGNOSIS — Z955 Presence of coronary angioplasty implant and graft: Secondary | ICD-10-CM | POA: Diagnosis not present

## 2021-03-26 DIAGNOSIS — Z1624 Resistance to multiple antibiotics: Secondary | ICD-10-CM | POA: Insufficient documentation

## 2021-03-26 DIAGNOSIS — I10 Essential (primary) hypertension: Secondary | ICD-10-CM | POA: Diagnosis not present

## 2021-03-26 DIAGNOSIS — I2510 Atherosclerotic heart disease of native coronary artery without angina pectoris: Secondary | ICD-10-CM | POA: Diagnosis not present

## 2021-03-26 DIAGNOSIS — B965 Pseudomonas (aeruginosa) (mallei) (pseudomallei) as the cause of diseases classified elsewhere: Secondary | ICD-10-CM | POA: Insufficient documentation

## 2021-03-26 DIAGNOSIS — Z9359 Other cystostomy status: Secondary | ICD-10-CM | POA: Diagnosis not present

## 2021-03-26 DIAGNOSIS — N319 Neuromuscular dysfunction of bladder, unspecified: Secondary | ICD-10-CM | POA: Insufficient documentation

## 2021-03-26 DIAGNOSIS — Z79899 Other long term (current) drug therapy: Secondary | ICD-10-CM | POA: Diagnosis not present

## 2021-03-26 DIAGNOSIS — Z881 Allergy status to other antibiotic agents status: Secondary | ICD-10-CM | POA: Diagnosis not present

## 2021-03-26 DIAGNOSIS — Z7984 Long term (current) use of oral hypoglycemic drugs: Secondary | ICD-10-CM | POA: Diagnosis not present

## 2021-03-26 DIAGNOSIS — G35 Multiple sclerosis: Secondary | ICD-10-CM | POA: Insufficient documentation

## 2021-03-26 DIAGNOSIS — Z7982 Long term (current) use of aspirin: Secondary | ICD-10-CM | POA: Insufficient documentation

## 2021-03-26 DIAGNOSIS — Z87891 Personal history of nicotine dependence: Secondary | ICD-10-CM | POA: Diagnosis not present

## 2021-03-26 DIAGNOSIS — Z22322 Carrier or suspected carrier of Methicillin resistant Staphylococcus aureus: Secondary | ICD-10-CM

## 2021-03-26 NOTE — Progress Notes (Signed)
NAME: Remington Highbaugh [facility]  DOB: 1950/09/14  MRN: [id number]  Date/Time: 03/26/2021 9:22 AM   Subjective:   ?pt here with his wife. Referred by urologist  Cassandria Anger [facility] is a 70 y.o.male  with a history of Multiple sclerosis on monthly natalizumab, neurogenic bladder supra pubic catheter is here referred by urologist for MDRO in the urine- Pt went to his urologist and was c/o smelly urin- So urine culture was sent and it had pseudomonas and morganella resistant to many Iv and all Po antibiotic- pt does not have any symptoms like abdominal pain, hematuria, back pain fever etc. Wife was worried that in [place] he had UTI and was hospitalized with sepsis and was treated for ESBL e.coli infection. She did not want another sepsis Pt does not drink much water Pt works in his shed carving wood- he has received multiple injuries to his legs by hitting on stuff- also his 1 yr old dog has scratched him in many places. No bites as per patient  Past Medical History:  Diagnosis Date   Complication of anesthesia    with sedation had to have narcan 2020   Coronary artery disease    Depression    Diabetes mellitus without complication (HCC)    diet controlled   GERD (gastroesophageal reflux disease)    Hypertension    MS (multiple sclerosis) (HCC) 10/02/2015   Neuromuscular disorder (HCC)    multiple sclerosis   Sleep apnea     Past Surgical History:  Procedure Laterality Date   CHOLECYSTECTOMY N/A 09/02/2016   Procedure: LAPAROSCOPIC CHOLECYSTECTOMY WITH CHOLANGIOGRAM;  Surgeon: Ricarda Frame, MD;  Location: [facility] OR[facility];  Service: General;  Laterality: N/A;   CORONARY ANGIOPLASTY     stent 2000   CYSTOSCOPY WITH URETHRAL DILATATION N/A 09/04/2019   Procedure: CYSTOSCOPY WITH Suprapubic Tract DILATATION;  Surgeon: Riki Altes, MD;  Location: [facility] OR[facility];  Service: Urology;  Laterality: N/A;   HERNIA REPAIR  2014   [facility]- Dr. Egbert Garibaldi   IR CATHETER TUBE CHANGE  12/21/2019   IR GENERIC HISTORICAL   07/02/2016   IR PERC CHOLECYSTOSTOMY 07/02/2016 Richarda Overlie, MD [facility]   suprapubic tube insertion      TONSILLECTOMY      Social History   Socioeconomic History   Marital status: Married    Spouse name: Not on file   Number of children: Not on file   Years of education: Not on file   Highest education level: Not on file  Occupational History   Not on file  Tobacco Use   Smoking status: Former    Packs/day: 2.00    Years: 25.00    Pack years: 50.00    Types: Cigarettes    Quit date: 08/24/1978    Years since quitting: 42.6   Smokeless tobacco: Former    Quit date: 05/20/1979  Vaping Use   Vaping Use: Never used  Substance and Sexual Activity   Alcohol use: No   Drug use: No   Sexual activity: Not Currently  Other Topics Concern   Not on file  Social History Narrative   Not on file   Social Determinants of Health   Financial Resource Strain: Not on file  Food Insecurity: Not on file  Transportation Needs: Not on file  Physical Activity: Not on file  Stress: Not on file  Social Connections: Not on file  Intimate Partner Violence: Not on file    Family History  Problem Relation Age of Onset   Ulcerative colitis  Mother    Diabetes Mother    Diabetes Sister    Colon polyps Sister    Prostate cancer Neg Hx    Bladder Cancer Neg Hx    Kidney cancer Neg Hx    Allergies  Allergen Reactions   Ciprofloxacin     itchi9ng and area proximal to IV site red and blotchy    Iodinated Diagnostic Agents Itching    Itching x 3 days   Oxycodone-Acetaminophen Itching   I? Current Outpatient Medications  Medication Sig Dispense Refill   aspirin EC 81 MG tablet Take 81 mg by mouth at bedtime.      atorvastatin (LIPITOR) 10 MG tablet Take 10 mg by mouth at bedtime.      citalopram (CELEXA) 40 MG tablet Take 40 mg by mouth daily.      furosemide (LASIX) 20 MG tablet Take 20 mg by mouth daily as needed (fluid retention.).      gabapentin (NEURONTIN) 300 MG capsule Take  900 mg by mouth 3 (three) times daily.      ibuprofen (ADVIL,MOTRIN) 200 MG tablet Take 600 mg by mouth every 8 (eight) hours as needed (for pain.).      losartan (COZAAR) 50 MG tablet Take 50 mg by mouth daily.     metFORMIN (GLUCOPHAGE) 500 MG tablet Take 500 mg by mouth daily with breakfast.      natalizumab (TYSABRI) 300 MG/15ML injection Inject 300 mg into the vein every 6 (six) weeks. Received at [facility].     nitrofurantoin, macrocrystal-monohydrate, (MACROBID) 100 MG capsule Take 1 capsule (100 mg total) by mouth at bedtime. 90 capsule 0   oxybutynin (DITROPAN) 5 MG tablet Take 5 mg by mouth every 8 (eight) hours as needed (for urinary incontinence).      PAIN MANAGEMENT INTRATHECAL, IT, PUMP 1 each by Intrathecal route continuous. Intrathecal (IT) medication:Baclofen     pantoprazole (PROTONIX) 40 MG tablet Take 40 mg by mouth 2 (two) times daily.      No current facility-administered medications for this visit.     Abtx:  Anti-infectives (From admission, onward)    None       REVIEW OF SYSTEMS:  Const: negative fever, negative chills, negative weight loss Eyes: negative diplopia or visual changes, negative eye pain ENT: negative coryza, negative sore throat Resp: negative cough, hemoptysis, dyspnea Cards: negative for chest pain, palpitations, lower extremity edema GU: as above GI: Negative for abdominal pain, diarrhea, bleeding, constipation Skin: multiple wounds.scabs legs and hands Heme: negative for easy bruising and gum/nose bleeding MS: negative for myalgias, arthralgias, back pain and muscle weakness Neurolo:wheel chair bound- stiffness legs,   Psych: negative for feelings of anxiety, depression  Endocrine: negative for thyroid, diabetes Allergy/Immunology-as above ?  Objective:  VITALS:  BP (!) 176/84   Pulse 76   Temp 98.1 F (36.7 C) (Oral)   Resp 16   Ht 5\' 10"  (1.778 m)   Wt 183 lb (83 kg)   BMI 26.26 kg/m  PHYSICAL EXAM:  General: Alert,  cooperative, no distress, appears stated age.  Head: Normocephalic, without obvious abnormality, atraumatic. Eyes: Conjunctivae clear, anicteric sclerae. Pupils are equal ENT Nares normal. No drainage or sinus tenderness. Lips, mucosa, and tongue normal. No Thrush Neck: Supple, symmetrical, no adenopathy, thyroid: non tender no carotid bruit and no JVD. Back: No CVA tenderness. Lungs: Clear to auscultation bilaterally. No Wheezing or Rhonchi. No rales. Heart: Regular rate and rhythm, no murmur, rub or gallop. Abdomen: Soft, protruberant on the rt  side Baclofen pump left side Extremities: multiple scabs and abrasions and wounds on the legs  Scratch marks  Skin: as above Lymph: Cervical, supraclavicular normal. Neurologic: did not examine in detail Pertinent Labs Lab Results CBC    Component Value Date/Time   WBC 8.7 11/20/2019 1251   RBC 4.64 11/20/2019 1251   HGB 15.0 11/20/2019 1251   HGB 13.4 06/18/2013 0510   HCT 43.4 11/20/2019 1251   HCT 37.8 (L) 06/18/2013 0510   PLT 132 (L) 11/20/2019 1251   PLT 162 06/18/2013 0510   MCV 93.5 11/20/2019 1251   MCV 99 06/18/2013 0510   MCH 32.3 11/20/2019 1251   MCHC 34.6 11/20/2019 1251   RDW 14.8 11/20/2019 1251   RDW 14.6 (H) 06/18/2013 0510   LYMPHSABS 2.6 08/24/2016 1336   LYMPHSABS 3.5 06/18/2013 0510   MONOABS 0.7 08/24/2016 1336   MONOABS 0.7 06/18/2013 0510   EOSABS 0.9 (H) 08/24/2016 1336   EOSABS 0.5 06/18/2013 0510   BASOSABS 0.0 08/24/2016 1336   BASOSABS 0.1 06/18/2013 0510    CMP Latest Ref Rng & Units 09/03/2016 09/02/2016 08/24/2016  Glucose 65 - 99 mg/dL 545(G) - 256(L)  BUN 6 - 20 mg/dL 6 - 8  Creatinine 8.93 - 1.24 mg/dL 7.34 2.87 6.81  Sodium 135 - 145 mmol/L 139 - 137  Potassium 3.5 - 5.1 mmol/L 4.3 - 3.5  Chloride 101 - 111 mmol/L 100(L) - 94(L)  CO2 22 - 32 mmol/L 32 - 34(H)  Calcium 8.9 - 10.3 mg/dL 1.5(B) - 9.1  Total Protein 6.5 - 8.1 g/dL 6.9 - 7.5  Total Bilirubin 0.3 - 1.2 mg/dL 0.8 - 0.7   Alkaline Phos 38 - 126 U/L 49 - 57  AST 15 - 41 U/L 46(H) - 24  ALT 17 - 63 U/L 48 - 27      Microbiology: Recent Results (from the past 240 hour(s))  Urine Culture     Status: Abnormal   Collection Time: 03/16/21  2:00 PM   Specimen: Urine, Clean Catch  Result Value Ref Range Status   Specimen Description   Final    URINE, CLEAN CATCH Performed at [facility], [address] [place], [place] [address]    Special Requests   Final    NONE Performed at [facility], [address] [place], [place] [address]    Culture (A)  Final    >=100,000 COLONIES/mL MORGANELLA MORGANII 60,000 COLONIES/mL PSEUDOMONAS AERUGINOSA    Report Status 03/19/2021 FINAL  Final   Organism ID, Bacteria MORGANELLA MORGANII (A)  Final   Organism ID, Bacteria PSEUDOMONAS AERUGINOSA (A)  Final      Susceptibility   Morganella morganii - MIC*    AMPICILLIN >=32 RESISTANT Resistant     CEFAZOLIN >=64 RESISTANT Resistant     CIPROFLOXACIN 1 SENSITIVE Sensitive     GENTAMICIN 2 SENSITIVE Sensitive     IMIPENEM 8 INTERMEDIATE Intermediate     NITROFURANTOIN 128 RESISTANT Resistant     TRIMETH/SULFA >=320 RESISTANT Resistant     AMPICILLIN/SULBACTAM >=32 RESISTANT Resistant     PIP/TAZO <=4 SENSITIVE Sensitive     * >=100,000 COLONIES/mL MORGANELLA MORGANII   Pseudomonas aeruginosa - MIC*    CEFTAZIDIME 16 INTERMEDIATE Intermediate     CIPROFLOXACIN >=4 RESISTANT Resistant     GENTAMICIN <=1 SENSITIVE Sensitive     IMIPENEM >=16 RESISTANT Resistant     * 60,000 COLONIES/mL PSEUDOMONAS AERUGINOSA    ? Impression/Recommendation ?70 y.o.male  with a  history of Multiple sclerosis on monthly natalizumab, neurogenic bladder supra pubic catheter is here referred by urologist for MDRO in the urine  Neurogenic bladder with suprapubic catheter ? ?MDRO pseudomonas and mirganella in the urine- currently they are colonizing the bladder/catheter and not a pathogen as no  symptoms Smelly urine is not an indication of UTI- told patient and wife that he should be drinking 2 liters of water to increase the urine amount  Treating colonization  will not prevent a true infection later on even in the setting of MS on mab. As asymptomatic will not treat these organisms which will need IV gentamycin- currently the risk of Rx outweighs benefit- Asked patient to increase his water intake May check his wbc if needed Treat if patient is going for cystoscopy or becomes symptomatic including worsening of MS symptoms  Neurogenic bladder with SPC cathter- recent change and upsize- some bleeding at the site- she will discuss with urologist  MS- on natalizumab  Baclofen pump on the left side The previous pump insertion was complicated by a hematoma-and infection  Avoid reinsertion unless it is imperative  Discussed with patient,and his wife in great detail and his wife was happy with the plan Communicated with the  requesting provider Will do a phone check next week  Follow up PRN  Note:  This document was prepared using Dragon voice recognition software and may include unintentional dictation errors.

## 2021-03-26 NOTE — Patient Instructions (Addendum)
You have a suprapubic catheter and you are colonized with morganella and pseudomonas- you have no symptoms curently- your urine having a smell is not an indicator of infection but more a concentrated urine, you  need more water close to 2 litres a day. If you develop any symptoms like increasing fatigue, pain abdomen, flank pain fever, chills, or poor appetite you need to be treated. Please let  your urologist or PCP or myself know if you have any of these symptoms

## 2021-04-16 ENCOUNTER — Other Ambulatory Visit: Payer: Self-pay

## 2021-04-16 ENCOUNTER — Encounter: Payer: Self-pay | Admitting: Urology

## 2021-04-16 ENCOUNTER — Ambulatory Visit (INDEPENDENT_AMBULATORY_CARE_PROVIDER_SITE_OTHER): Payer: Medicare Other | Admitting: Urology

## 2021-04-16 VITALS — BP 183/94 | HR 67 | Ht 72.0 in | Wt 183.0 lb

## 2021-04-16 DIAGNOSIS — N319 Neuromuscular dysfunction of bladder, unspecified: Secondary | ICD-10-CM

## 2021-04-16 NOTE — Progress Notes (Signed)
Suprapubic Cath Change  Patient is present today for a suprapubic catheter change due to urinary retention.  90ml of water was drained from the balloon, a 18FR foley cath was removed from the tract with out difficulty.  Site was cleaned and prepped in a sterile fashion with betadine.  A 18FR foley cath was replaced into the tract no complications were noted. Urine return was noted, 10 ml of sterile water was inflated into the balloon and a night bag was attached for drainage.  Patient tolerated well.    Preformed by: Teressa Lower, CMA, Irineo Axon, MD  Follow up: 1 month SPT change

## 2021-04-16 NOTE — Progress Notes (Signed)
   04/16/21  CC:  Chief Complaint  Patient presents with   Cysto    HPI: Refer to Shannon's note 03/16/2021  Refer to rooming tab for vitals NED. A&Ox3.   No respiratory distress   Abd soft, NT, ND Normal phallus with bilateral descended testicles  Cystoscopy Procedure Note  Patient identification was confirmed, informed consent was obtained, and patient was prepped using Betadine solution.      Pre-Procedure: - SP tube was removed - Inspection reveals a normal suprapubic tube tract  Procedure: The flexible cystoscope was introduced without difficulty - Bilateral ureteral orifices identified - Bladder mucosa  reveals mild inflammatory change secondary to indwelling SP tube.  No solid or papillary lesions identified - No bladder stones -18 FR SP catheter placed   Post-Procedure: - Patient tolerated the procedure well  Assessment/ Plan: No bladder mucosal abnormalities Continue monthly SP tube changes    Riki Altes, MD

## 2021-04-20 ENCOUNTER — Other Ambulatory Visit: Payer: Self-pay | Admitting: Urology

## 2021-04-20 DIAGNOSIS — N390 Urinary tract infection, site not specified: Secondary | ICD-10-CM

## 2021-04-24 DIAGNOSIS — R252 Cramp and spasm: Secondary | ICD-10-CM | POA: Insufficient documentation

## 2021-05-12 ENCOUNTER — Ambulatory Visit (INDEPENDENT_AMBULATORY_CARE_PROVIDER_SITE_OTHER): Payer: Medicare Other | Admitting: Physician Assistant

## 2021-05-12 ENCOUNTER — Ambulatory Visit
Admission: RE | Admit: 2021-05-12 | Discharge: 2021-05-12 | Disposition: A | Discharge status: Home / Self Care | Payer: Medicare Other | Source: Ambulatory Visit | Attending: Physician Assistant | Admitting: Physician Assistant

## 2021-05-12 ENCOUNTER — Other Ambulatory Visit: Payer: Self-pay

## 2021-05-12 VITALS — BP 178/104 | HR 89 | Temp 97.8°F

## 2021-05-12 DIAGNOSIS — R310 Gross hematuria: Secondary | ICD-10-CM | POA: Diagnosis present

## 2021-05-12 NOTE — Patient Instructions (Addendum)
I saw you in clinic today for significant gross hematuria which started yesterday and was notable for clot passage. Despite exchanging your suprapubic catheter and irrigating it, you continued to have evidence of significant bleeding in your urine. We obtained a STAT bladder ultrasound, which showed evidence of clot material in your bladder but was otherwise normal. A urine microscopy in clinic was only notable for >30 RBCs/hpf and was without WBCs or bacteria. Urine dip was unable to be read due to a hemolyzed and clotted sample. Your vitals upon presentation were notable for BP 178/104 and pulse 89. You were afebrile with an oral temp of 97.45F.  Based on your ongoing bleeding, we placed a 16Fr coude urethral Foley catheter with 20ccs in the balloon and upsized your chronic suprapubic catheter to a 20Fr with 30ccs in the balloon. You continued to have significant bleeding even after placing both these catheters on tension. At this point, we started you on continuous bladder irrigation (CBI) in the office. You continued to have significant bleeding despite this. I was able to manually irrigate about 250ccs of clot material from your bladder during this time.  Ultimately, we wanted to direct admit you to [facility] from clinic for CBI, labs, and imaging. Unfortunately due to bed constraints, we were unable to do this. At that point, we recommended transferring you to the Emergency Department so you may be admitted via that route.  You are not showing concerning signs for infection today. You deny any recent catheter pulls or trauma. You do not have a history of pelvic radiation or prostatic enlargement. You had a normal cystoscopy with Dr. Lonna Cobb on 04/16/2021. You take baby aspirin but no other blood thinners. There is no obvious urologic source for your ongoing bleeding right now and we are concerned there may be a hematologic source for this, especially given your low platelets during  your last admission at [facility].

## 2021-05-12 NOTE — Progress Notes (Signed)
05/12/2021 11:27 AM   Jesus Sanchez [facility] Apr 28, 1951 [id number]  CC: Chief Complaint  Patient presents with   Urinary Retention   Hematuria   HPI: Jesus Sanchez is a 70 y.o. male with PMH MS with neurogenic bladder managed with suprapubic catheter who presents today for evaluation of gross hematuria.  He is accompanied today by his wife, who contributes to HPI.  Today he reports noticing dark urine yesterday morning which spontaneously worsened yesterday evening to grossly bloody with some clots.  His wife exchanged his suprapubic catheter at home when it stopped draining secondary to hematuria, but only had a 16 French catheter on hand.  He denies traumatic Foley pulls, bladder pain, flank pain, fever, chills, nausea, vomiting, or increased spasticity.  He was recently admitted at [facility] and discharged 10 days ago with malfunctioning baclofen pump.  He underwent CTAP with and without contrast at that time.  I am not able to review original imaging, however radiology report states no hydronephrosis, nonenlarged prostate, and does not mention urolithiasis.  He underwent cystoscopy with Dr. Lonna Cobb less than 1 month ago with no significant findings.  He does not have a history of pelvic radiation or prostate cancer.  Most recent PSA was 0.96 on 09/15/2020.  He takes a baby aspirin, but no other blood thinners.  In-office UA today unable to be read due to hemolyzed and clotted sample; urine microscopy with >30 RBCs/HPF. Per chart review, platelets were low at 129 on 05/02/2021 during recent admission at [facility]. He reports receiving Heparin during that admission.  He was sent from clinic today for stat pelvic ultrasound.  Report notable for SPT balloon inflated within the bladder lumen with complex, hypodense material in the bladder representing debris vs clot. Last meal 0530 this morning.  PMH: Past Medical History:  Diagnosis Date   Complication of anesthesia    with sedation had  to have narcan 2020   Coronary artery disease    Depression    Diabetes mellitus without complication (HCC)    diet controlled   GERD (gastroesophageal reflux disease)    Hypertension    MS (multiple sclerosis) (HCC) 10/02/2015   Neuromuscular disorder (HCC)    multiple sclerosis   Sleep apnea    Surgical History: Past Surgical History:  Procedure Laterality Date   CHOLECYSTECTOMY N/A 09/02/2016   Procedure: LAPAROSCOPIC CHOLECYSTECTOMY WITH CHOLANGIOGRAM;  Surgeon: Ricarda Frame, MD;  Location: [facility] ORS;  Service: General;  Laterality: N/A;   CORONARY ANGIOPLASTY     stent 2000   CYSTOSCOPY WITH URETHRAL DILATATION N/A 09/04/2019   Procedure: CYSTOSCOPY WITH Suprapubic Tract DILATATION;  Surgeon: Riki Altes, MD;  Location: [facility] ORS;  Service: Urology;  Laterality: N/A;   HERNIA REPAIR  2014   [facility]- Dr. Egbert Garibaldi   IR CATHETER TUBE CHANGE  12/21/2019   IR GENERIC HISTORICAL  07/02/2016   IR PERC CHOLECYSTOSTOMY 07/02/2016 Richarda Overlie, MD [facility]INTER[facility] RAD   suprapubic tube insertion      TONSILLECTOMY      Home Medications:  Allergies as of 05/12/2021       Reactions   Ciprofloxacin    itchi9ng and area proximal to IV site red and blotchy    Iodinated Diagnostic Agents Itching   Itching x 3 days   Oxycodone-acetaminophen Itching        Medication List        Accurate as of May 12, 2021 11:27 AM. If you have any questions, ask your nurse or doctor.  aspirin EC 81 MG tablet Take 81 mg by mouth at bedtime.   atorvastatin 10 MG tablet Commonly known as: LIPITOR Take 10 mg by mouth at bedtime.   citalopram 40 MG tablet Commonly known as: CELEXA Take 40 mg by mouth daily.   furosemide 20 MG tablet Commonly known as: LASIX Take 20 mg by mouth daily as needed (fluid retention.).   gabapentin 300 MG capsule Commonly known as: NEURONTIN Take 900 mg by mouth 3 (three) times daily.   ibuprofen 200 MG tablet Commonly known as: ADVIL Take 600 mg by  mouth every 8 (eight) hours as needed (for pain.).   losartan 50 MG tablet Commonly known as: COZAAR Take 50 mg by mouth daily.   metFORMIN 500 MG tablet Commonly known as: GLUCOPHAGE Take 500 mg by mouth daily with breakfast.   natalizumab 300 MG/15ML injection Commonly known as: TYSABRI Inject 300 mg into the vein every 6 (six) weeks. Received at [facility].   nitrofurantoin (macrocrystal-monohydrate) 100 MG capsule Commonly known as: MACROBID TAKE 1 CAPSULE(100 MG) BY MOUTH AT BEDTIME   oxybutynin 5 MG tablet Commonly known as: DITROPAN Take 5 mg by mouth every 8 (eight) hours as needed (for urinary incontinence).   PAIN MANAGEMENT INTRATHECAL (IT) PUMP 1 each by Intrathecal route continuous. Intrathecal (IT) medication:Baclofen   pantoprazole 40 MG tablet Commonly known as: PROTONIX Take 40 mg by mouth 2 (two) times daily.        Allergies:  Allergies  Allergen Reactions   Ciprofloxacin     itchi9ng and area proximal to IV site red and blotchy    Iodinated Diagnostic Agents Itching    Itching x 3 days   Oxycodone-Acetaminophen Itching    Family History: Family History  Problem Relation Age of Onset   Ulcerative colitis Mother    Diabetes Mother    Diabetes Sister    Colon polyps Sister    Prostate cancer Neg Hx    Bladder Cancer Neg Hx    Kidney cancer Neg Hx     Social History:   reports that he quit smoking about 42 years ago. His smoking use included cigarettes. He has a 50.00 pack-year smoking history. He quit smokeless tobacco use about 42 years ago. He reports that he does not drink alcohol and does not use drugs.  Physical Exam: BP (!) 178/104   Pulse 89   Temp 97.8 F (36.6 C) (Oral)   Constitutional:  Alert and oriented, no acute distress, nontoxic appearing HEENT: [place], AT Cardiovascular: No clubbing, cyanosis, or edema Respiratory: Normal respiratory effort, no increased work of breathing GU: SPT in place draining grossly bloody efflux with  small clots. Blood leaking from urethral meatus. Uncircumcised phallus. Skin: No rashes, bruises or suspicious lesions Neurologic: Grossly intact, no focal deficits, moving all 4 extremities Psychiatric: Normal mood and affect  Laboratory Data: Results for orders placed or performed in visit on 05/12/21  Microscopic Examination   Urine  Result Value Ref Range   WBC, UA None seen 0 - 5 /hpf   RBC >30 (H) 0 - 2 /hpf   Epithelial Cells (non renal) 0-10 0 - 10 /hpf   Bacteria, UA WILL FOLLOW   Urinalysis, Complete  Result Value Ref Range   Specific Gravity, UA CANCELED    pH, UA CANCELED    Color, UA Red (A) Yellow   Appearance Ur Turbid (A) Clear   Protein,UA CANCELED    Glucose, UA CANCELED    Ketones, UA CANCELED  Microscopic Examination See below:    Pertinent Imaging: STAT Pelvic US, 05/12/2021: CLINICAL DATA:  Gross hematuria.   EXAM: LIMITED ULTRASOUND OF PELVIS   TECHNIQUE: Limited transabdominal ultrasound examination of the pelvis was performed.   COMPARISON:  February 07, 2020.   FINDINGS: Limited sonographic evaluation was performed of the bladder. Balloon tip of suprapubic catheter appears to be within the bladder lumen. There appears to be predominantly hypodense but complex material within the urinary bladder which may represent debris or possibly clot.   IMPRESSION: Balloon tip of suprapubic catheter appears to be within bladder lumen. Predominantly hypodense but complex material is noted within the urinary bladder lumen which may represent debris or possibly clot.     Electronically Signed   By: Lupita Raider M.D.   On: 05/12/2021 12:34  I personally reviewed the images referenced above and note clot material within the bladder.  In Office Procedures:  Suprapubic Cath Change  Patient is present today for a suprapubic catheter change due to gross hematuria.  50ml of water was drained from the balloon, a 16FR foley cath was removed from the  tract without difficulty.  Site was cleaned and prepped in a sterile fashion with betadine.  A 18FR foley cath was replaced into the tract no complications were noted. Urine return was noted, 10 ml of sterile water was inflated into the balloon and a night bag was attached for drainage.  Patient tolerated well.    Performed by: Carman Ching, PA-C and Franchot Erichsen, CMA  Bladder Irrigation  Due to gross hematuria patient is present today for a bladder irrigation. Patient was cleaned and prepped in a sterile fashion. 500 ml of sterile water was instilled and irrigated into the bladder with a 9ml Toomey syringe through the catheter in place.  After irrigation urine flow remained dark red and I was able to clear about 25ccs of clot material. Patient tolerated well.   Performed by: Carman Ching, PA-C   Simple URETHRAL Catheter Placement  Due to gross hematuria patient is present today for a urethral foley cath placement.  Patient was cleaned and prepped in a sterile fashion with betadine. A 16 FR coude foley catheter was inserted, urine return was noted  51ml, urine was dark red in color.  The balloon was filled with 20cc of sterile water.  Patient tolerated well, no complications were noted   Performed by: Carman Ching, PA-C   Suprapubic Cath Change  Patient is present today for a suprapubic catheter change due to gross hematuria/clot retention.  52ml of water was drained from the balloon, a 18FR foley cath was removed from the tract without difficulty.  Site was cleaned and prepped in a sterile fashion with betadine.  A 20FR foley cath was replaced into the tract no complications were noted. Urine return was noted, 30 ml of sterile water was inflated into the balloon and a night bag was attached for drainage.  Patient tolerated well.     Performed by: Carman Ching, PA-C   Bladder Irrigation  Due to gross hematuria/clot retention patient is present today for  a bladder irrigation. Patient was cleaned and prepped in a sterile fashion. 3L sterile water was instilled and irrigated into the bladder with a 39ml Toomey syringe through the catheter in place.  250ccs of clot material were removed from the bladder. After irrigation urine flow remained cherry red. Patient tolerated well.   Performed by: Carman Ching, PA-C   Assessment & Plan:   1. Hematuria,  gross Significant, ongoing gross hematuria today in this patient with a recent normal cystoscopy not on significant anticoagulation.  Low clinical suspicion for infection or catheter trauma at this time.  No history of pelvic radiation.  Overall, there is no obvious urologic source for his significant gross hematuria today.  We started CBI in clinic and his urine did not clear significantly.  It remained light pink to cherry red and had a rather pulsatile quality to its darkening.  At this point, we recommended direct admission to [facility] for further evaluation, however a bed was not available.  We recommended proceeding to the emergency department and patient elected to go to [facility] given financial aid available at that institution.  Patient was sent from clinic today with a 12 French coud urethral Foley catheter with 20 cc in the balloon, plugged, and a 20 French suprapubic catheter with 30 cc in the balloon attached to a night bag.  I recapped his visit in his AVS to give to staff at the [facility] emergency department. - US Pelvis Limited; Future - Urinalysis, Complete  Return for go immediately to the Emergency Department for further evaluation.  Carman Ching, PA-C  I spent 54+ minutes on the day of the encounter to include pre-visit record review, face-to-face time with the patient, and post-visit ordering of tests.   [facility] [address] [place], [place] [address] [phone]

## 2021-05-13 DIAGNOSIS — R310 Gross hematuria: Secondary | ICD-10-CM | POA: Insufficient documentation

## 2021-05-15 LAB — MICROSCOPIC EXAMINATION
Bacteria, UA: NONE SEEN
RBC, Urine: >30 /hpf — ABNORMAL HIGH (ref 0–2)
WBC, UA: NONE SEEN /hpf (ref 0–5)

## 2021-05-15 LAB — URINALYSIS, COMPLETE

## 2021-05-17 LAB — CULTURE, URINE COMPREHENSIVE

## 2021-05-18 ENCOUNTER — Ambulatory Visit: Payer: Medicare Other | Admitting: Urology

## 2021-06-16 NOTE — Progress Notes (Signed)
Suprapubic Cath Change  Patient is present today for a suprapubic catheter change due to urinary retention.  7 ml of water was drained from the balloon, a 20 FR foley cath was removed from the tract with out difficulty.  Site was cleaned and prepped in a sterile fashion with betadine.  A 20 FR foley cath was replaced into the tract no complications were noted. Urine return was noted, 10 ml of sterile water was inflated into the balloon and a night bag was attached for drainage.  Patient tolerated well.   Performed by: Harle Battiest, PA-C  Follow up: One month follow for SPT exchange

## 2021-06-17 ENCOUNTER — Other Ambulatory Visit: Payer: Self-pay

## 2021-06-17 ENCOUNTER — Ambulatory Visit (INDEPENDENT_AMBULATORY_CARE_PROVIDER_SITE_OTHER): Payer: Medicare Other | Admitting: Urology

## 2021-06-17 ENCOUNTER — Encounter: Payer: Self-pay | Admitting: Urology

## 2021-06-17 DIAGNOSIS — R339 Retention of urine, unspecified: Secondary | ICD-10-CM

## 2021-06-17 DIAGNOSIS — T83010A Breakdown (mechanical) of cystostomy catheter, initial encounter: Secondary | ICD-10-CM

## 2021-06-22 ENCOUNTER — Other Ambulatory Visit
Admission: RE | Admit: 2021-06-22 | Discharge: 2021-06-22 | Disposition: A | Discharge status: Home / Self Care | Payer: Medicare Other | Attending: Urology | Admitting: Urology

## 2021-06-22 ENCOUNTER — Encounter: Payer: Self-pay | Admitting: Urology

## 2021-06-22 ENCOUNTER — Other Ambulatory Visit: Payer: Self-pay

## 2021-06-22 ENCOUNTER — Other Ambulatory Visit: Payer: Self-pay | Admitting: *Deleted

## 2021-06-22 ENCOUNTER — Ambulatory Visit: Payer: Medicare Other | Admitting: Urology

## 2021-06-22 VITALS — BP 132/77 | HR 71 | Ht 69.5 in | Wt 180.0 lb

## 2021-06-22 DIAGNOSIS — N319 Neuromuscular dysfunction of bladder, unspecified: Secondary | ICD-10-CM

## 2021-06-22 DIAGNOSIS — T83010A Breakdown (mechanical) of cystostomy catheter, initial encounter: Secondary | ICD-10-CM

## 2021-06-22 DIAGNOSIS — R310 Gross hematuria: Secondary | ICD-10-CM

## 2021-06-22 DIAGNOSIS — B379 Candidiasis, unspecified: Secondary | ICD-10-CM | POA: Diagnosis not present

## 2021-06-22 LAB — BASIC METABOLIC PANEL
Anion gap: 3 — ABNORMAL LOW (ref 5–15)
BUN: 8 mg/dL (ref 8–23)
CO2: 31 mmol/L (ref 22–32)
Calcium: 8.6 mg/dL — ABNORMAL LOW (ref 8.9–10.3)
Chloride: 97 mmol/L — ABNORMAL LOW (ref 98–111)
Creatinine, Ser: 0.64 mg/dL (ref 0.61–1.24)
GFR, Estimated: >60 mL/min (ref >60)
Glucose, Bld: 257 mg/dL — ABNORMAL HIGH (ref 70–99)
Potassium: 3.5 mmol/L (ref 3.5–5.1)
Sodium: 131 mmol/L — ABNORMAL LOW (ref 135–145)

## 2021-06-22 LAB — URINALYSIS, COMPLETE (UACMP) WITH MICROSCOPIC
Bilirubin Urine: NEGATIVE
Glucose, UA: 500 mg/dL — AB
Ketones, ur: NEGATIVE mg/dL
Nitrite: POSITIVE — AB
Protein, ur: 30 mg/dL — AB
Specific Gravity, Urine: 1.02 (ref 1.005–1.030)
pH: 6.5 (ref 5.0–8.0)

## 2021-06-22 LAB — CBC WITH DIFFERENTIAL/PLATELET
Abs Immature Granulocytes: 0.03 10*3/uL (ref 0.00–0.07)
Basophils Absolute: 0 10*3/uL (ref 0.0–0.1)
Basophils Relative: 0 %
Eosinophils Absolute: 0.3 10*3/uL (ref 0.0–0.5)
Eosinophils Relative: 3 %
HCT: 41 % (ref 39.0–52.0)
Hemoglobin: 13.9 g/dL (ref 13.0–17.0)
Immature Granulocytes: 0 %
Lymphocytes Relative: 25 %
Lymphs Abs: 2.4 10*3/uL (ref 0.7–4.0)
MCH: 32.3 pg (ref 26.0–34.0)
MCHC: 33.9 g/dL (ref 30.0–36.0)
MCV: 95.3 fL (ref 80.0–100.0)
Monocytes Absolute: 0.9 10*3/uL (ref 0.1–1.0)
Monocytes Relative: 10 %
Neutro Abs: 5.8 10*3/uL (ref 1.7–7.7)
Neutrophils Relative %: 62 %
Platelets: 162 10*3/uL (ref 150–400)
RBC: 4.3 MIL/uL (ref 4.22–5.81)
RDW: 14 % (ref 11.5–15.5)
WBC: 9.6 10*3/uL (ref 4.0–10.5)
nRBC: 0.3 % — ABNORMAL HIGH (ref 0.0–0.2)

## 2021-06-22 LAB — LACTIC ACID, PLASMA: Lactic Acid, Venous: 1.1 mmol/L (ref 0.5–1.9)

## 2021-06-22 MED ORDER — SULFAMETHOXAZOLE-TRIMETHOPRIM 800-160 MG PO TABS: 1.0000 | ORAL_TABLET | Freq: Two times a day (BID) | ORAL | 0 refills | Status: DC

## 2021-06-22 MED ORDER — FLUCONAZOLE 100 MG PO TABS: 100.0000 mg | ORAL_TABLET | Freq: Every day | ORAL | 0 refills | Status: DC

## 2021-06-22 NOTE — Progress Notes (Signed)
06/22/2021 2:49 PM   [place] 06/08/1951 [id number]  Referring provider: Kirk Ruths, MD [place] [facility] [place],   [id number]  Chief Complaint  Patient presents with   Follow-up    Blood clotting in cath bag   Urological history 1. Neurogenic bladder -secondary to MS -managed with SPT exchanged monthly -last exchange 06/17/2021  2. High risk hematuria -former smoker -recently admitted for CBI at [facility] 04/2021 -non-contrast and contrast CT 04/2021 NED -cysto 2022 NED -reports of gross heme -UA 11-20 RBC's   3. Nephrolithiasis -Punctate nonobstructive nephrolithiasis in the inferior pole of the left kidney on CT 2022  HPI: Jesus Sanchez is a 70 y.o. male who presents today for having bright red blood from penis and clotting, blood and clots in urine bag over the weekend.  He and his wife state that over the weekend, he has been experiencing off and on suprapubic pain associated with an episode of bright red blood seen in the depends and some clot formation in the bag.  The urine has been red to dark brown and he hasn't felt good.   He also states that he has had some penile burning that reminds him of an UTI or passing a stone.  He has noted that when he needs to have a BM, this symptom occurs.    He had a very large stool this morning.  (As large as a horse's poop - per patient's wife)   Patient denies any modifying or aggravating factors.  Patient denies any fevers, chills, nausea or vomiting.    At the time of the visit, the urine in the overnight bag was amber clear.    SPT was plugged for specimen.   When the SPT was unplugged and allowed to drain, it was clear yellow.    UA nitrite positive, 6-10 WBC's, 11-20 RBC's and many bacteria with yeast present.    Patient's wife had a picture of the blood from the penis in the depends.  It was bright red with a smear in the "crotch" area of the pad.  The  whiteness of the pad was visible under the blood.    PMH: Past Medical History:  Diagnosis Date   Complication of anesthesia    with sedation had to have narcan 2020   Coronary artery disease    Depression    Diabetes mellitus without complication (HCC)    diet controlled   GERD (gastroesophageal reflux disease)    Hypertension    MS (multiple sclerosis) ([place]) 10/02/2015   Neuromuscular disorder ([place])    multiple sclerosis   Sleep apnea     Surgical History: Past Surgical History:  Procedure Laterality Date   CHOLECYSTECTOMY N/A 09/02/2016   Procedure: LAPAROSCOPIC CHOLECYSTECTOMY WITH CHOLANGIOGRAM;  Surgeon: Clayburn Pert, MD;  Location: [facility] ORS;  Service: General;  Laterality: N/A;   CORONARY ANGIOPLASTY     stent 2000   CYSTOSCOPY WITH URETHRAL DILATATION N/A 09/04/2019   Procedure: CYSTOSCOPY WITH Suprapubic Tract DILATATION;  Surgeon: Abbie Sons, MD;  Location: [facility] ORS;  Service: Urology;  Laterality: N/A;   HERNIA REPAIR  2014   [facility]- Dr. Marina Gravel   IR CATHETER TUBE CHANGE  12/21/2019   IR GENERIC HISTORICAL  07/02/2016   IR PERC CHOLECYSTOSTOMY 07/02/2016 Markus Daft, MD [facility]INTER[facility]   suprapubic tube insertion      TONSILLECTOMY      Home Medications:  Allergies as of 06/22/2021  Reactions   Ciprofloxacin Itching   itchi9ng and area proximal to IV site red and blotchy  Rash   Iodinated Diagnostic Agents Itching   Itching x 3 days   Oxycodone-acetaminophen Itching        Medication List        Accurate as of June 22, 2021  2:49 PM. If you have any questions, ask your nurse or doctor.          aspirin EC 81 MG tablet Take 81 mg by mouth at bedtime.   atorvastatin 10 MG tablet Commonly known as: LIPITOR Take 10 mg by mouth at bedtime.   citalopram 40 MG tablet Commonly known as: CELEXA Take 40 mg by mouth daily.   fluconazole 100 MG tablet Commonly known as: DIFLUCAN Take 1 tablet (100 mg total) by mouth daily. X 7  days Started by: Zara Council, PA-C   furosemide 20 MG tablet Commonly known as: LASIX Take 20 mg by mouth daily as needed (fluid retention.).   gabapentin 300 MG capsule Commonly known as: NEURONTIN Take 900 mg by mouth 3 (three) times daily.   ibuprofen 200 MG tablet Commonly known as: ADVIL Take 600 mg by mouth every 8 (eight) hours as needed (for pain.).   losartan 50 MG tablet Commonly known as: COZAAR Take 50 mg by mouth daily.   metFORMIN 500 MG tablet Commonly known as: GLUCOPHAGE Take 500 mg by mouth daily with breakfast.   natalizumab 300 MG/15ML injection Commonly known as: TYSABRI Inject 300 mg into the vein every 6 (six) weeks. Received at [facility].   nitrofurantoin (macrocrystal-monohydrate) 100 MG capsule Commonly known as: MACROBID TAKE 1 CAPSULE(100 MG) BY MOUTH AT BEDTIME   oxybutynin 5 MG tablet Commonly known as: DITROPAN Take 5 mg by mouth every 8 (eight) hours as needed (for urinary incontinence).   PAIN MANAGEMENT INTRATHECAL (IT) PUMP 1 each by Intrathecal route continuous. Intrathecal (IT) medication:Baclofen   pantoprazole 40 MG tablet Commonly known as: PROTONIX Take 40 mg by mouth 2 (two) times daily.   sulfamethoxazole-trimethoprim 800-160 MG tablet Commonly known as: BACTRIM DS Take 1 tablet by mouth every 12 (twelve) hours. Started by: Zara Council, PA-C        Allergies:  Allergies  Allergen Reactions   Ciprofloxacin Itching    itchi9ng and area proximal to IV site red and blotchy  Rash   Iodinated Diagnostic Agents Itching    Itching x 3 days   Oxycodone-Acetaminophen Itching    Family History: Family History  Problem Relation Age of Onset   Ulcerative colitis Mother    Diabetes Mother    Diabetes Sister    Colon polyps Sister    Prostate cancer Neg Hx    Bladder Cancer Neg Hx    Kidney cancer Neg Hx     Social History:  reports that he quit smoking about 42 years ago. His smoking use included cigarettes. He  has a 50.00 pack-year smoking history. He quit smokeless tobacco use about 42 years ago. He reports that he does not drink alcohol and does not use drugs.  ROS: Pertinent ROS in HPI  Physical Exam: BP 132/77   Pulse 71   Ht 5' 9.5" (1.765 m)   Wt 180 lb (81.6 kg)   BMI 26.20 kg/m   Constitutional:  Well nourished. Alert and oriented, No acute distress. HEENT: [place] AT, mask in place.  Trachea midline Cardiovascular: No clubbing, cyanosis, or edema. Respiratory: Normal respiratory effort, no increased work of breathing. Neurologic:  Grossly intact, no focal deficits, moving upper extremities.  In wheelchair.   Psychiatric: Normal mood and affect.  Laboratory Data: Results for orders placed or performed during the hospital encounter of 06/22/21  Lactic Acid, Plasma  Result Value Ref Range   Lactic Acid, Venous 1.1 0.5 - 1.9 mmol/L  Basic metabolic panel  Result Value Ref Range   Sodium 131 (L) 135 - 145 mmol/L   Potassium 3.5 3.5 - 5.1 mmol/L   Chloride 97 (L) 98 - 111 mmol/L   CO2 31 22 - 32 mmol/L   Glucose, Bld 257 (H) 70 - 99 mg/dL   BUN 8 8 - 23 mg/dL   Creatinine, Ser 2.44 0.61 - 1.24 mg/dL   Calcium 8.6 (L) 8.9 - 10.3 mg/dL   GFR, Estimated >01 >02 mL/min   Anion gap 3 (L) 5 - 15  CBC with Differential/Platelet  Result Value Ref Range   WBC 9.6 4.0 - 10.5 K/uL   RBC 4.30 4.22 - 5.81 MIL/uL   Hemoglobin 13.9 13.0 - 17.0 g/dL   HCT 72.5 36.6 - 44.0 %   MCV 95.3 80.0 - 100.0 fL   MCH 32.3 26.0 - 34.0 pg   MCHC 33.9 30.0 - 36.0 g/dL   RDW 34.7 42.5 - 95.6 %   Platelets 162 150 - 400 K/uL   nRBC 0.3 (H) 0.0 - 0.2 %   Neutrophils Relative % 62 %   Neutro Abs 5.8 1.7 - 7.7 K/uL   Lymphocytes Relative 25 %   Lymphs Abs 2.4 0.7 - 4.0 K/uL   Monocytes Relative 10 %   Monocytes Absolute 0.9 0.1 - 1.0 K/uL   Eosinophils Relative 3 %   Eosinophils Absolute 0.3 0.0 - 0.5 K/uL   Basophils Relative 0 %   Basophils Absolute 0.0 0.0 - 0.1 K/uL   Immature Granulocytes 0 %    Abs Immature Granulocytes 0.03 0.00 - 0.07 K/uL  Urinalysis, Complete w Microscopic  Result Value Ref Range   Color, Urine YELLOW YELLOW   APPearance CLEAR CLEAR   Specific Gravity, Urine 1.020 1.005 - 1.030   pH 6.5 5.0 - 8.0   Glucose, UA 500 (A) NEGATIVE mg/dL   Hgb urine dipstick MODERATE (A) NEGATIVE   Bilirubin Urine NEGATIVE NEGATIVE   Ketones, ur NEGATIVE NEGATIVE mg/dL   Protein, ur 30 (A) NEGATIVE mg/dL   Nitrite POSITIVE (A) NEGATIVE   Leukocytes,Ua SMALL (A) NEGATIVE   Squamous Epithelial / LPF 11-20 0 - 5   WBC, UA 6-10 0 - 5 WBC/hpf   RBC / HPF 11-20 0 - 5 RBC/hpf   Bacteria, UA MANY (A) NONE SEEN   Hyphae Yeast PRESENT    Granular Casts, UA PRESENT   I have reviewed the labs.    Pertinent Imaging: N/A  Assessment & Plan:    1. Gross hematuria -likely secondary to UTI as he had a recent work up for gross heme -UA grossly positive -urine culture -start Septra DS and Diflucan  -patient to report any return of gross heme  2. Constipation -MiraLax daily causes accidents -will try MiraLax every other day to see if the BM's become more regular   3. Neurogenic bladder -continue with monthly SPT exchanges   Return for pending urine culture results .  These notes generated with voice recognition software. I apologize for typographical errors.  Michiel Cowboy, PA-C  [facility] [address]  [address] [place], [place] [address] [phone]

## 2021-06-26 LAB — URINE CULTURE: Culture: >=100000 — AB

## 2021-06-29 ENCOUNTER — Other Ambulatory Visit: Payer: Self-pay | Admitting: Urology

## 2021-06-29 DIAGNOSIS — R310 Gross hematuria: Secondary | ICD-10-CM

## 2021-06-29 DIAGNOSIS — N390 Urinary tract infection, site not specified: Secondary | ICD-10-CM

## 2021-06-29 NOTE — Progress Notes (Unsigned)
I have placed an urgent referral for Jesus Sanchez to ID

## 2021-06-30 ENCOUNTER — Other Ambulatory Visit: Payer: Self-pay

## 2021-06-30 ENCOUNTER — Ambulatory Visit: Payer: Medicare Other | Attending: Infectious Diseases | Admitting: Infectious Diseases

## 2021-06-30 VITALS — BP 174/84 | HR 71 | Temp 97.6°F | Resp 16 | Ht 69.5 in | Wt 180.6 lb

## 2021-06-30 DIAGNOSIS — G35 Multiple sclerosis: Secondary | ICD-10-CM | POA: Diagnosis not present

## 2021-06-30 DIAGNOSIS — R8271 Bacteriuria: Secondary | ICD-10-CM

## 2021-06-30 DIAGNOSIS — Z2239 Carrier of other specified bacterial diseases: Secondary | ICD-10-CM | POA: Diagnosis not present

## 2021-06-30 DIAGNOSIS — N319 Neuromuscular dysfunction of bladder, unspecified: Secondary | ICD-10-CM | POA: Insufficient documentation

## 2021-06-30 DIAGNOSIS — Z87891 Personal history of nicotine dependence: Secondary | ICD-10-CM | POA: Insufficient documentation

## 2021-06-30 NOTE — Patient Instructions (Signed)
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

## 2021-06-30 NOTE — Progress Notes (Signed)
NAME: Jesus Sanchez  DOB: Dec 15, 1950  MRN: [id number]  Date/Time: 06/30/2021 10:46 AM   Subjective:   ?pt here with his wife.  He has been asked to see me by the urologist. I had seen him in September 2022 for multidrug-resistant organism in the urine culture.  He had Pseudomonas and Morganella.  He has a suprapubic catheter.  Decided not to treat the medication with IV antibiotics because it was considered to be colonization and  absent symptoms. He was doing fine until he had hematuria in October 2022 and was hospitalized at [facility]  He underwent bladder washes. He was seen by ID at that admission and they did not think it was related to UTI and did not treat  Last week the patient had some hematuria from his penis and from the [facility]. The urologist sent d urine culture and that showed Pseudomonas resistant to many antibiotics.  The patient has been referred back to me Patient says hematuria has cleared up now He is feeling fine He does not have any pain abdomen He has no fever He has not been on any antibiotics He went for hunting yesterday   The following is taken from the notes last visit. Jesus Sanchez [facility] is a 70 y.o.male  with a history of Multiple sclerosis on monthly natalizumab, neurogenic bladder supra pubic catheter is here referred by urologist for MDRO in the urine- Pt went to his urologist and was c/o smelly urin- So urine culture was sent and it had pseudomonas and morganella resistant to many Iv and all Po antibiotic- pt does not have any symptoms like abdominal pain, hematuria, back pain fever etc. Wife was worried that in [place] he had UTI and was hospitalized with sepsis and was treated for ESBL e.coli infection. She did not want another sepsis Pt does not drink much water Pt works in his shed carving wood- he has received multiple injuries to his legs by hitting on stuff- also his 28 yr old dog has scratched him in many places. No bites as per patient  Past Medical  History:  Diagnosis Date   Complication of anesthesia    with sedation had to have narcan 2020   Coronary artery disease    Depression    Diabetes mellitus without complication (HCC)    diet controlled   GERD (gastroesophageal reflux disease)    Hypertension    MS (multiple sclerosis) ([place]) 10/02/2015   Neuromuscular disorder ([place])    multiple sclerosis   Sleep apnea     Past Surgical History:  Procedure Laterality Date   CHOLECYSTECTOMY N/A 09/02/2016   Procedure: LAPAROSCOPIC CHOLECYSTECTOMY WITH CHOLANGIOGRAM;  Surgeon: Clayburn Pert, MD;  Location: [facility] ORS;  Service: General;  Laterality: N/A;   CORONARY ANGIOPLASTY     stent 2000   CYSTOSCOPY WITH URETHRAL DILATATION N/A 09/04/2019   Procedure: CYSTOSCOPY WITH Suprapubic Tract DILATATION;  Surgeon: Abbie Sons, MD;  Location: [facility] ORS;  Service: Urology;  Laterality: N/A;   HERNIA REPAIR  2014   [facility]- Dr. Marina Gravel   IR CATHETER TUBE CHANGE  12/21/2019   IR GENERIC HISTORICAL  07/02/2016   IR PERC CHOLECYSTOSTOMY 07/02/2016 Markus Daft, MD [facility]INTER[facility] RAD   suprapubic tube insertion      TONSILLECTOMY      Social History   Socioeconomic History   Marital status: Married    Spouse name: Not on file   Number of children: Not on file   Years of education: Not on file  Highest education level: Not on file  Occupational History   Not on file  Tobacco Use   Smoking status: Former    Packs/day: 2.00    Years: 25.00    Pack years: 50.00    Types: Cigarettes    Quit date: 08/24/1978    Years since quitting: 42.8   Smokeless tobacco: Former    Quit date: 05/20/1979  Vaping Use   Vaping Use: Never used  Substance and Sexual Activity   Alcohol use: No   Drug use: No   Sexual activity: Not Currently  Other Topics Concern   Not on file  Social History Narrative   Not on file   Social Determinants of Health   Financial Resource Strain: Not on file  Food Insecurity: Not on file  Transportation Needs: Not on file   Physical Activity: Not on file  Stress: Not on file  Social Connections: Not on file  Intimate Partner Violence: Not on file    Family History  Problem Relation Age of Onset   Ulcerative colitis Mother    Diabetes Mother    Diabetes Sister    Colon polyps Sister    Prostate cancer Neg Hx    Bladder Cancer Neg Hx    Kidney cancer Neg Hx    Allergies  Allergen Reactions   Ciprofloxacin Itching    itchi9ng and area proximal to IV site red and blotchy  Rash   Iodinated Diagnostic Agents Itching    Itching x 3 days   Oxycodone-Acetaminophen Itching   I? Current Outpatient Medications  Medication Sig Dispense Refill   aspirin EC 81 MG tablet Take 81 mg by mouth at bedtime.      atorvastatin (LIPITOR) 10 MG tablet Take 10 mg by mouth at bedtime.      citalopram (CELEXA) 40 MG tablet Take 40 mg by mouth daily.      furosemide (LASIX) 20 MG tablet Take 20 mg by mouth daily as needed (fluid retention.).      gabapentin (NEURONTIN) 300 MG capsule Take 900 mg by mouth 3 (three) times daily.      ibuprofen (ADVIL,MOTRIN) 200 MG tablet Take 600 mg by mouth every 8 (eight) hours as needed (for pain.).      losartan (COZAAR) 50 MG tablet Take 50 mg by mouth daily.     metFORMIN (GLUCOPHAGE) 500 MG tablet Take 500 mg by mouth daily with breakfast.      nitrofurantoin, macrocrystal-monohydrate, (MACROBID) 100 MG capsule TAKE 1 CAPSULE(100 MG) BY MOUTH AT BEDTIME 90 capsule 3   oxybutynin (DITROPAN) 5 MG tablet Take 5 mg by mouth every 8 (eight) hours as needed (for urinary incontinence).      PAIN MANAGEMENT INTRATHECAL, IT, PUMP 1 each by Intrathecal route continuous. Intrathecal (IT) medication:Baclofen     pantoprazole (PROTONIX) 40 MG tablet Take 40 mg by mouth 2 (two) times daily.      fluconazole (DIFLUCAN) 100 MG tablet Take 1 tablet (100 mg total) by mouth daily. X 7 days (Patient not taking: Reported on 06/30/2021) 7 tablet 0   natalizumab (TYSABRI) 300 MG/15ML injection Inject 300  mg into the vein every 6 (six) weeks. [facility] at [facility].     sulfamethoxazole-trimethoprim (BACTRIM DS) 800-160 MG tablet Take 1 tablet by mouth every 12 (twelve) hours. (Patient not taking: Reported on 06/30/2021) 14 tablet 0   No current facility-administered medications for this visit.     Abtx:  Anti-infectives (From admission, onward)    None  REVIEW OF SYSTEMS:  Const: negative fever, negative chills, negative weight loss Eyes: negative diplopia or visual changes, negative eye pain ENT: negative coryza, negative sore throat Resp: negative cough, hemoptysis, dyspnea Cards: negative for chest pain, palpitations, lower extremity edema GU: as above GI: Negative for abdominal pain, diarrhea, bleeding, has constipation Skin: multiple wounds.scabs legs and hands Heme: negative for easy bruising and gum/nose bleeding MS: Has some muscle spasm occasionally Neurolo:wheel chair bound- stiffness legs,   Psych: negative for feelings of anxiety, depression  Endocrine: negative for thyroid, diabetes Allergy/Immunology-as above ?  Objective:  VITALS:  BP (!) 174/84    Pulse 71    Temp 97.6 F (36.4 C) (Oral)    Resp 16    Ht 5' 9.5" (1.765 m)    Wt 180 lb 9.6 oz (81.9 kg)    SpO2 97%    BMI 26.29 kg/m  PHYSICAL EXAM:  General: Alert, cooperative, no distress,   Head: Normocephalic, without obvious abnormality, atraumatic. Eyes: Conjunctivae clear, anicteric sclerae. Pupils are equal ENT Nares normal. No drainage or sinus tenderness. Lips, mucosa, and tongue normal. No Thrush Neck: , symmetrical, no adenopathy, thyroid: non tender no carotid bruit and no JVD. Back: No CVA tenderness. Lungs: Clear to auscultation bilaterally. No Wheezing or Rhonchi. No rales. Heart: Regular rate and rhythm, no murmur, rub or gallop. Abdomen: Soft, protruberant on the rt side Baclofen pump left side SPC Extremities: multiple scabs and abrasions and wounds on the legs  Scratch marks  Skin:  as above Lymph: Cervical, supraclavicular normal. Neurologic: did not examine in detail Pertinent Labs Lab Results CBC    Component Value Date/Time   WBC 9.6 06/22/2021 1147   RBC 4.30 06/22/2021 1147   HGB 13.9 06/22/2021 1147   HGB 13.4 06/18/2013 0510   HCT 41.0 06/22/2021 1147   HCT 37.8 (L) 06/18/2013 0510   PLT 162 06/22/2021 1147   PLT 162 06/18/2013 0510   MCV 95.3 06/22/2021 1147   MCV 99 06/18/2013 0510   MCH 32.3 06/22/2021 1147   MCHC 33.9 06/22/2021 1147   RDW 14.0 06/22/2021 1147   RDW 14.6 (H) 06/18/2013 0510   LYMPHSABS 2.4 06/22/2021 1147   LYMPHSABS 3.5 06/18/2013 0510   MONOABS 0.9 06/22/2021 1147   MONOABS 0.7 06/18/2013 0510   EOSABS 0.3 06/22/2021 1147   EOSABS 0.5 06/18/2013 0510   BASOSABS 0.0 06/22/2021 1147   BASOSABS 0.1 06/18/2013 0510    CMP Latest Ref Rng & Units 06/22/2021 09/03/2016 09/02/2016  Glucose 70 - 99 mg/dL 257(H) 158(H) -  BUN 8 - 23 mg/dL 8 6 -  Creatinine 0.61 - 1.24 mg/dL 0.64 0.71 0.89  Sodium 135 - 145 mmol/L 131(L) 139 -  Potassium 3.5 - 5.1 mmol/L 3.5 4.3 -  Chloride 98 - 111 mmol/L 97(L) 100(L) -  CO2 22 - 32 mmol/L 31 32 -  Calcium 8.9 - 10.3 mg/dL 8.6(L) 8.4(L) -  Total Protein 6.5 - 8.1 g/dL - 6.9 -  Total Bilirubin 0.3 - 1.2 mg/dL - 0.8 -  Alkaline Phos 38 - 126 U/L - 49 -  AST 15 - 41 U/L - 46(H) -  ALT 17 - 63 U/L - 48 -      Microbiology: Recent Results (from the past 240 hour(s))  Urine Culture     Status: Abnormal   Collection Time: 06/22/21 12:57 PM   Specimen: Urine, Random  Result Value Ref Range Status   Specimen Description   Final    URINE, RANDOM  Performed at [facility], [address], [place], [place] [address]    Special Requests   Final    NONE Performed at [facility], [address] [place], [place] [address]    Culture (A)  Final    >=100,000 COLONIES/mL MORGANELLA MORGANII >=100,000 COLONIES/mL PSEUDOMONAS AERUGINOSA    Report Status 06/26/2021 FINAL   Final   Organism ID, Bacteria PSEUDOMONAS AERUGINOSA (A)  Final   Organism ID, Bacteria MORGANELLA MORGANII (A)  Final      Susceptibility   Morganella morganii - MIC*    AMPICILLIN >=32 RESISTANT Resistant     CEFAZOLIN >=64 RESISTANT Resistant     CIPROFLOXACIN 2 RESISTANT Resistant     GENTAMICIN 2 SENSITIVE Sensitive     IMIPENEM 0.5 SENSITIVE Sensitive     NITROFURANTOIN 128 RESISTANT Resistant     TRIMETH/SULFA >=320 RESISTANT Resistant     AMPICILLIN/SULBACTAM >=32 RESISTANT Resistant     * >=100,000 COLONIES/mL MORGANELLA MORGANII   Pseudomonas aeruginosa - MIC*    CEFTAZIDIME 16 INTERMEDIATE Intermediate     CIPROFLOXACIN >=4 RESISTANT Resistant     GENTAMICIN 2 SENSITIVE Sensitive     IMIPENEM >=16 RESISTANT Resistant     * >=100,000 COLONIES/mL PSEUDOMONAS AERUGINOSA    ? Impression/Recommendation ?70 y.o.male  with a history of Multiple sclerosis on monthly natalizumab, neurogenic bladder supra pubic catheter is here referred by urologist for MDRO in the urine  Neurogenic bladder with suprapubic catheter ? ?MDRO pseudomonas and morganella in the urine- currently they are colonizing the bladder/catheter   patient recently had hematuria but that has cleared now.  He was in the hospital at [facility] but was not treated with antibiotics. Last week he again had hematuria but that has cleared again. Patient is feeling fine now. As asymptomatic will not treat these organisms  Asked patient to increase his water intake Treat if patient is going for cystoscopy or becomes symptomatic including worsening of MS symptoms.worsening hematuria IV gent , IV Avycaz  Neurogenic bladder with SPC catheter-   MS- on natalizumab   Discussed with patient,and his wife in great detail and his wife was happy with the plan Communicated with urology NP  Follow  PRN  Note:  This document was prepared using Dragon voice recognition software and may include unintentional dictation errors.

## 2021-07-21 ENCOUNTER — Ambulatory Visit: Payer: Medicare Other | Admitting: Physician Assistant

## 2021-07-21 ENCOUNTER — Ambulatory Visit: Payer: Medicare Other | Admitting: Urology

## 2021-07-27 ENCOUNTER — Ambulatory Visit: Payer: Medicare Other | Admitting: Urology

## 2021-08-02 NOTE — Progress Notes (Signed)
Suprapubic Cath Change  Patient is present today for a suprapubic catheter change due to urinary retention.  9 ml of water was drained from the balloon, a 20 silastic FR foley cath was removed from the tract with out difficulty.  Site was cleaned and prepped in a sterile fashion with betadine.  A 20 FR coude foley cath was replaced into the tract no complications were noted. Urine return was noted, 10 ml of sterile water was inflated into the balloon and a night bag was attached for drainage.  Patient tolerated well.   Performed by: Zara Council, PA-C   Follow up: CT's and cysto 2022.  Follow up in one month for SPT exchange.

## 2021-08-03 ENCOUNTER — Ambulatory Visit (INDEPENDENT_AMBULATORY_CARE_PROVIDER_SITE_OTHER): Payer: Medicare Other | Admitting: Urology

## 2021-08-03 ENCOUNTER — Encounter: Payer: Self-pay | Admitting: Urology

## 2021-08-03 ENCOUNTER — Other Ambulatory Visit: Payer: Self-pay

## 2021-08-03 VITALS — BP 162/82 | HR 60 | Ht 69.0 in | Wt 180.0 lb

## 2021-08-03 DIAGNOSIS — R339 Retention of urine, unspecified: Secondary | ICD-10-CM

## 2021-08-03 DIAGNOSIS — T83010A Breakdown (mechanical) of cystostomy catheter, initial encounter: Secondary | ICD-10-CM

## 2021-08-27 ENCOUNTER — Telehealth: Payer: Self-pay

## 2021-08-27 NOTE — Telephone Encounter (Signed)
Responded back in [place] message

## 2021-08-27 NOTE — Telephone Encounter (Signed)
Pt's wife call and states that she called on Tuesday, she left a voicemail but has not received a call back. She states that the patient has had green sediment in his catheter back after emptying as well as some green sediment in the tubing. She nor her husband has ever experienced this before. Pt denies UTI symptoms. No fever or chills. He has not started any new medications or foods lately. Urine looks normal. They question what if anything should do. Please advise.

## 2021-09-04 NOTE — Progress Notes (Signed)
Cath Change/ Replacement  Patient is present today for a catheter change due to urinary retention.  54ml of water was removed from the balloon, a 20FR foley cath was removed with out difficulty.  Patient was cleaned and prepped in a sterile fashion with betadine. A 20 silastic  FR foley cath was replaced into the bladder complications were noted as:    Urine return was noted 20 ml and urine was red in color. The balloon was filled with 65ml of sterile water. A  bag was attached for drainage.  A night bag was also given to the patient and patient was given instruction on how to change from one bag to another. Patient was given proper instruction on catheter care.    Performed by: Michiel Cowboy, PA-C  Follow up: Return in about 1 month (around 10/05/2021) for SPT exchange .  I,Ramez Arrona,acting as a Neurosurgeon for [employer], PA-C.,have documented all relevant documentation on the behalf of Jesus Hunley, PA-C,as directed by  [facility], PA-C while in the presence of Jesus Azam, PA-C.

## 2021-09-07 ENCOUNTER — Other Ambulatory Visit
Admission: RE | Admit: 2021-09-07 | Discharge: 2021-09-07 | Disposition: A | Discharge status: Home / Self Care | Payer: Medicare Other | Source: Ambulatory Visit | Attending: Urology | Admitting: Urology

## 2021-09-07 ENCOUNTER — Other Ambulatory Visit: Payer: Self-pay

## 2021-09-07 ENCOUNTER — Ambulatory Visit: Payer: Medicare Other | Admitting: Urology

## 2021-09-07 ENCOUNTER — Encounter: Payer: Self-pay | Admitting: Urology

## 2021-09-07 ENCOUNTER — Ambulatory Visit (INDEPENDENT_AMBULATORY_CARE_PROVIDER_SITE_OTHER): Payer: Medicare Other | Admitting: Urology

## 2021-09-07 VITALS — BP 177/79 | HR 61 | Ht 69.0 in | Wt 180.0 lb

## 2021-09-07 DIAGNOSIS — N319 Neuromuscular dysfunction of bladder, unspecified: Secondary | ICD-10-CM | POA: Insufficient documentation

## 2021-09-07 LAB — URINALYSIS, COMPLETE (UACMP) WITH MICROSCOPIC
Bilirubin Urine: NEGATIVE
Glucose, UA: NEGATIVE mg/dL
Hgb urine dipstick: NEGATIVE
Ketones, ur: NEGATIVE mg/dL
Nitrite: POSITIVE — AB
Protein, ur: 100 mg/dL — AB
Specific Gravity, Urine: <1.005 — ABNORMAL LOW (ref 1.005–1.030)
pH: >9 — ABNORMAL HIGH (ref 5.0–8.0)

## 2021-10-04 NOTE — Progress Notes (Signed)
Cath Change/ Replacement ? ?Patient is present today for a catheter change due to urinary retention.  9 ml of water was removed from the balloon, a 20 silastic  FR foley cath was removed with out difficulty.  Patient was cleaned and prepped in a sterile fashion with betadine. A 20 FR foley cath was replaced into the bladder no complications were noted Urine return was noted 20 ml and urine was medium/pink urine in color that we will send for urinalysis for culture. The balloon was filled with 64ml of sterile water. A  bag was attached for drainage.  A night bag was also given to the patient and patient was given instruction on how to change from one bag to another. Patient was given proper instruction on catheter care.   ? ?Performed by: Michiel Cowboy, PA-C  ? ?Follow up: Return in about 1 month (around 11/05/2021) for SPT exchange .   Urine is sent for urinalysis and culture.  UA nitrite positive, many bacteria, amorphous crystal present and triple phosphate crystals present.   ? ? ?I,Brayten Komar,acting as a scribe for [employer], PA-C.,have documented all relevant documentation on the behalf of Jamarri Vuncannon, PA-C,as directed by  [facility], PA-C while in the presence of Tijana Walder, PA-C. ? ?I have reviewed the above documentation for accuracy and completeness, and I agree with the above.   ? ?Michiel Cowboy, PA-C  ?

## 2021-10-05 ENCOUNTER — Other Ambulatory Visit
Admission: RE | Admit: 2021-10-05 | Discharge: 2021-10-05 | Disposition: A | Discharge status: Home / Self Care | Payer: Medicare Other | Attending: Urology | Admitting: Urology

## 2021-10-05 ENCOUNTER — Ambulatory Visit: Payer: Medicare Other | Admitting: Urology

## 2021-10-05 ENCOUNTER — Other Ambulatory Visit: Payer: Self-pay

## 2021-10-05 ENCOUNTER — Encounter: Payer: Self-pay | Admitting: Urology

## 2021-10-05 VITALS — Ht 69.0 in | Wt 180.0 lb

## 2021-10-05 DIAGNOSIS — N319 Neuromuscular dysfunction of bladder, unspecified: Secondary | ICD-10-CM | POA: Insufficient documentation

## 2021-10-05 DIAGNOSIS — R310 Gross hematuria: Secondary | ICD-10-CM | POA: Diagnosis not present

## 2021-10-05 LAB — URINALYSIS, COMPLETE (UACMP) WITH MICROSCOPIC
RBC / HPF: >50 RBC/hpf (ref 0–5)
Squamous Epithelial / HPF: NONE SEEN (ref 0–5)

## 2021-10-07 ENCOUNTER — Telehealth: Payer: Self-pay

## 2021-10-07 ENCOUNTER — Other Ambulatory Visit
Admission: RE | Admit: 2021-10-07 | Discharge: 2021-10-07 | Disposition: A | Discharge status: Home / Self Care | Payer: Medicare Other | Source: Other Acute Inpatient Hospital | Attending: Urology | Admitting: Urology

## 2021-10-07 ENCOUNTER — Other Ambulatory Visit: Payer: Self-pay

## 2021-10-07 DIAGNOSIS — R310 Gross hematuria: Secondary | ICD-10-CM | POA: Diagnosis not present

## 2021-10-07 DIAGNOSIS — Z87440 Personal history of urinary (tract) infections: Secondary | ICD-10-CM | POA: Insufficient documentation

## 2021-10-07 LAB — URINE CULTURE

## 2021-10-07 NOTE — Telephone Encounter (Signed)
Pt's wife (ok per DPR) states pt is still having some blood in bag although not as much as yesterday, and pt is still taking Macrobid daily. Advised wife we would need to recollect for another culture. Pt's wife states she will drop off sample at [facility] lab today. Orders placed for lab.  ?

## 2021-10-07 NOTE — Telephone Encounter (Signed)
-----   Message from Harle Battiest, PA-C sent at 10/07/2021  9:10 AM EDT ----- ?Please ask Jesus Sanchez if he is still having blood in the urine and if he is still taking his Macrobid daily.  His urine culture grew out multiple species, so we may need to recollect.  ?

## 2021-10-09 ENCOUNTER — Telehealth: Payer: Self-pay

## 2021-10-09 LAB — URINE CULTURE

## 2021-10-09 NOTE — Telephone Encounter (Signed)
Advised pt's wife we will hold Abx at this time and [place] will speak w Dr Bernardo Heater next week and get in touch with them. She expressed understanding.  ?

## 2021-10-09 NOTE — Telephone Encounter (Signed)
Spoke w/ pt's wife, advised her of results and message. She notes pt is still having some blood in urine however he is not showing any symptoms of UTI.  ?

## 2021-10-09 NOTE — Telephone Encounter (Signed)
-----   Message from Harle Battiest, PA-C sent at 10/09/2021  9:46 AM EDT ----- ?Mr. Gut's urine culture is still resulting as multiple species.  If the blood has cleared, I recommend no antibiotics.  I will also speak with Dr. Lonna Cobb regarding our next steps.  ?

## 2021-10-14 ENCOUNTER — Telehealth: Payer: Self-pay | Admitting: Urology

## 2021-10-14 DIAGNOSIS — R310 Gross hematuria: Secondary | ICD-10-CM

## 2021-10-14 NOTE — Telephone Encounter (Signed)
Will you let the [facility]'s know that I spoke with Dr. Lonna Cobb about the recurring blood in the urine and he would like Mr. Gadomski to have a CT urogram. ?

## 2021-10-15 NOTE — Telephone Encounter (Signed)
Left detailed message on VM that CT has been ordered and to call with any questions ?

## 2021-10-21 ENCOUNTER — Other Ambulatory Visit: Payer: Self-pay | Admitting: Urology

## 2021-10-21 ENCOUNTER — Telehealth: Payer: Self-pay | Admitting: Family Medicine

## 2021-10-21 DIAGNOSIS — F419 Anxiety disorder, unspecified: Secondary | ICD-10-CM

## 2021-10-21 MED ORDER — DIAZEPAM 10 MG PO TABS: 10.0000 mg | ORAL_TABLET | Freq: Once | ORAL | 0 refills | Status: AC

## 2021-10-21 NOTE — Telephone Encounter (Signed)
Patient's wife called and states patient needs to have sedation to do the CT scan. Imaging called them to schedule the appointment and they did not have a note to have it under sedation. Please advise. ?

## 2021-10-22 ENCOUNTER — Encounter: Payer: Self-pay | Admitting: *Deleted

## 2021-11-16 NOTE — Progress Notes (Deleted)
11/17/2021 11:18 PM   [place] [birth year] [id number]  Referring provider: Kirk Ruths, MD [facility] [place],  [place] [address]  No chief complaint on file.  Urological history 1. Neurogenic bladder -secondary to MS -managed with SPT exchanged monthly -last exchange 11/13/2021  2. High risk hematuria -former smoker -admitted for CBI at [facility] 04/2021 -non-contrast and contrast CT 04/2021 NED -cysto 2022 NED -contrast CT 10/2021 - NED -reports of gross heme   HPI: Jesus Sanchez is a 71 y.o. male who presents today for having been admitted to the hospital over the weekend for possible early sepsis due to UTI or cellulitis.  Suprapubic tube was exchanged on November 13, 2021.    His urine culture during his hospitalization grew out mixed flora    PMH: Past Medical History:  Diagnosis Date   Complication of anesthesia    with sedation had to have narcan 2020   Coronary artery disease    Depression    Diabetes mellitus without complication ([place])    diet controlled   GERD (gastroesophageal reflux disease)    Hypertension    MS (multiple sclerosis) ([place]) 10/02/2015   Neuromuscular disorder ([place])    multiple sclerosis   Sleep apnea     Surgical History: Past Surgical History:  Procedure Laterality Date   CHOLECYSTECTOMY N/A 09/02/2016   Procedure: LAPAROSCOPIC CHOLECYSTECTOMY WITH CHOLANGIOGRAM;  Surgeon: Clayburn Pert, MD;  Location: [facility] ORS;  Service: General;  Laterality: N/A;   CORONARY ANGIOPLASTY     stent 2000   CYSTOSCOPY WITH URETHRAL DILATATION N/A 09/04/2019   Procedure: CYSTOSCOPY WITH Suprapubic Tract DILATATION;  Surgeon: Abbie Sons, MD;  Location: [facility] ORS;  Service: Urology;  Laterality: N/A;   HERNIA REPAIR  2014   [facility]- Dr. Marina Gravel   IR CATHETER TUBE CHANGE  12/21/2019   IR GENERIC HISTORICAL  07/02/2016   IR PERC CHOLECYSTOSTOMY 07/02/2016 Markus Daft, MD [facility]-INTERV RAD    suprapubic tube insertion      TONSILLECTOMY      Home Medications:  Allergies as of 11/17/2021       Reactions   Ciprofloxacin Itching   itchi9ng and area proximal to IV site red and blotchy  Rash   Iodinated Contrast Media Itching   Itching x 3 days   Oxycodone-acetaminophen Itching        Medication List        Accurate as of Nov 16, 2021 11:18 PM. If you have any questions, ask your nurse or doctor.          aspirin EC 81 MG tablet Take 81 mg by mouth at bedtime.   atorvastatin 10 MG tablet Commonly known as: LIPITOR Take 10 mg by mouth at bedtime.   citalopram 40 MG tablet Commonly known as: CELEXA Take 40 mg by mouth daily.   furosemide 20 MG tablet Commonly known as: LASIX Take 20 mg by mouth daily as needed (fluid retention.).   gabapentin 300 MG capsule Commonly known as: NEURONTIN Take 900 mg by mouth 3 (three) times daily.   ibuprofen 200 MG tablet Commonly known as: ADVIL Take 600 mg by mouth every 8 (eight) hours as needed (for pain.).   losartan 50 MG tablet Commonly known as: COZAAR Take 50 mg by mouth daily.   metFORMIN 500 MG tablet Commonly known as: GLUCOPHAGE Take 500 mg by mouth daily with breakfast.   natalizumab 300 MG/15ML injection Commonly known as: TYSABRI Inject 300  mg into the vein every 6 (six) weeks. Received at [facility].   nitrofurantoin (macrocrystal-monohydrate) 100 MG capsule Commonly known as: MACROBID TAKE 1 CAPSULE(100 MG) BY MOUTH AT BEDTIME   oxybutynin 5 MG tablet Commonly known as: DITROPAN Take 5 mg by mouth every 8 (eight) hours as needed (for urinary incontinence).   PAIN MANAGEMENT INTRATHECAL (IT) PUMP 1 each by Intrathecal route continuous. Intrathecal (IT) medication:Baclofen   pantoprazole 40 MG tablet Commonly known as: PROTONIX Take 40 mg by mouth 2 (two) times daily.        Allergies:  Allergies  Allergen Reactions   Ciprofloxacin Itching    itchi9ng and area proximal to IV site red and  blotchy  Rash   Iodinated Contrast Media Itching    Itching x 3 days   Oxycodone-Acetaminophen Itching    Family History: Family History  Problem Relation Age of Onset   Ulcerative colitis Mother    Diabetes Mother    Diabetes Sister    Colon polyps Sister    Prostate cancer Neg Hx    Bladder Cancer Neg Hx    Kidney cancer Neg Hx     Social History:  reports that he quit smoking about 43 years ago. His smoking use included cigarettes. He has a 50.00 pack-year smoking history. He has never been exposed to tobacco smoke. He quit smokeless tobacco use about 42 years ago. He reports that he does not drink alcohol and does not use drugs.  ROS: Pertinent ROS in HPI  Physical Exam: There were no vitals taken for this visit.  Constitutional:  Well nourished. Alert and oriented, No acute distress. HEENT: [place] AT, moist mucus membranes.  Trachea midline Cardiovascular: No clubbing, cyanosis, or edema. Respiratory: Normal respiratory effort, no increased work of breathing. GU: No CVA tenderness.  No bladder fullness or masses.  Patient with circumcised/uncircumcised phallus. ***Foreskin easily retracted***  Urethral meatus is patent.  No penile discharge. No penile lesions or rashes. Scrotum without lesions, cysts, rashes and/or edema.  Testicles are located scrotally bilaterally. No masses are appreciated in the testicles. Left and right epididymis are normal. Rectal: Patient with  normal sphincter tone. Anus and perineum without scarring or rashes. No rectal masses are appreciated. Prostate is approximately *** grams, *** nodules are appreciated. Seminal vesicles are normal. Neurologic: Grossly intact, no focal deficits, moving all 4 extremities. Psychiatric: Normal mood and affect.   Laboratory Data: WBC (White Blood Cell Count) 3.2 - 9.8 x10^9/L 5.8   Hemoglobin 13.7 - 17.3 g/dL 14.7   Hematocrit 39.0 - 49.0 % 40.7   Platelets 150 - 450 x10^9/L 141 Low    MCV (Mean Corpuscular Volume)  80 - 98 fL 94   MCH (Mean Corpuscular Hemoglobin) 26.5 - 34.0 pg 34.0   MCHC (Mean Corpuscular Hemoglobin Concentration) 31.5 - 36.3 % 36.1   RBC (Red Blood Cell Count) 4.37 - 5.74 x10^12/L 4.32 Low    RDW-CV (Red Cell Distribution Width) 11.5 - 14.5 % 14.0   NRBC (Nucleated Red Blood Cell Count) 0 x10^9/L 0.04 High    NRBC % (Nucleated Red Blood Cell %) % 0.7   MPV (Mean Platelet Volume) 7.2 - 11.7 fL 10.1   Resulting Agency  [facility]  Specimen Collected: 11/16/21 07:42 Last Resulted: 11/16/21 08:17  Received From: [facility]  Result Received: 11/16/21 23:09   Sodium 135 - 145 mmol/L 139   Potassium 3.5 - 5.0 mmol/L 3.5   Chloride 98 - 108 mmol/L 101  Carbon Dioxide (CO2) 21 - 30 mmol/L 29   Urea Nitrogen (BUN) 7 - 20 mg/dL 3 Low    Creatinine 0.6 - 1.3 mg/dL 0.6   Glucose 70 - 140 mg/dL 182 High    Comment: Interpretive Data:  Above is the NONFASTING reference range.   Below are the FASTING reference ranges:  NORMAL:      70-99 mg/dL  PREDIABETES: 100-125 mg/dL  DIABETES:    > 125 mg/dL   Calcium 8.7 - 10.2 mg/dL 8.7   AST (Aspartate Aminotransferase) 15 - 41 U/L 15   ALT (Alanine Aminotransferase) 17 - 63 U/L 15 Low    Bilirubin, Total 0.4 - 1.5 mg/dL 0.7   Alk Phos (Alkaline Phosphatase) 24 - 110 U/L 51   Albumin 3.5 - 4.8 g/dL 3.1 Low    Protein, Total 6.2 - 8.1 g/dL 6.3   Anion Gap 3 - 12 mmol/L 9   BUN/CREA Ratio 6 - 27 5 Low    Glomerular Filtration Rate (eGFR)  mL/min/1.73sq m 104   Comment: CKD-EPI (2021) does not include patient's race in the calculation of eGFR. Monitoring changes of plasma creatinine and eGFR over time is useful for monitoring kidney function.  This change was made on 09/16/2020.   Interpretive Ranges for eGFR(CKD-EPI 2021):   eGFR:              > 60 mL/min/1.73 sq m - Normal  eGFR:              30 - 59 mL/min/1.73 sq m - Moderately Decreased  eGFR:              15 - 29 mL/min/1.73 sq m - Severely  Decreased  eGFR:              < 15 mL/min/1.73 sq m -  Kidney Failure    Note: These eGFR calculations do not apply in acute situations  when eGFR is changing rapidly or in patients on dialysis.   Resulting Agency  [facility]  Specimen Collected: 11/16/21 07:42 Last Resulted: 11/16/21 08:14  Received From: [facility]  Result Received: 11/16/21 23:09  I have reviewed the labs.    Pertinent Imaging: CT abdomen and pelvis with IV contrast   Comparison:  CT May 15, 2021 and May 12, 2021.   Indication:  Abdominal abscess/infection suspected, R50.9 Fever,  unspecified, Z93.59 Other cystostomy status (CMS-HCC), R31.0 Gross  hematuria, E11.69 Type 2 diabetes mellitus with other specified  complication (CMS-HCC).   Technique:  CT imaging was performed of the abdomen and pelvis following  the administration of intravenous contrast.  Iodinated contrast was used  due to the indications for the examination, to improve disease detection  and further define anatomy. Coronal and sagittal reformatted images were  generated and reviewed.   Findings:  - Lower Thorax: Bibasilar atelectasis. No pleural or pericardial effusions.  Small hiatal hernia.   - Liver: Normal in morphology and enhancement.  No suspicious hepatic  masses are identified.  The portal and hepatic veins are patent.   - Biliary and Gallbladder: No intrahepatic or extrahepatic bile duct  dilatation. The gallbladder is surgically absent.   - Spleen: Normal in appearance.     - Pancreas: Normal in appearance.   - Adrenal Glands: Normal in appearance.   - Kidneys: Symmetric in size and enhancement. No suspicious renal lesions.  No hydronephrosis.   - Abdominal and Pelvic Vasculature: No abdominal aortic aneurysm.   -  Gastrointestinal Tract: No abnormal dilation or wall thickening. Appendix  is normal. Large stool burden in the rectum.   -  Peritoneum/Mesentery/Retroperitoneum: No free fluid.  No free  intraperitoneal air.   - Lymph Nodes: No retroperitoneal, mesenteric, pelvic, or inguinal  lymphadenopathy.     - Bladder: Urinary bladder is decompressed with suprapubic catheter in  place.   - Pelvic Organs: Unremarkable.   - Body Wall: Unremarkable.   - Musculoskeletal:  No aggressive appearing osseous lesions.   Impression:  No acute process in the abdomen or pelvis.   Electronically Reviewed by:  Dorthy Cooler, MD, [facility]  Electronically Reviewed on:  10/27/2021 8:59 AM   I have reviewed the images and concur with the above findings.   Electronically Signed by:  Louie Bun, MD, [facility]  Electronically Signed on:  10/27/2021 1:23 PM Procedure Note  Louie Bun, MD - 10/27/2021  Formatting of this note might be different from the original.  CT abdomen and pelvis with IV contrast   Comparison:  CT May 15, 2021 and May 12, 2021.   Indication:  Abdominal abscess/infection suspected, R50.9 Fever,  unspecified, Z93.59 Other cystostomy status (CMS-HCC), R31.0 Gross  hematuria, E11.69 Type 2 diabetes mellitus with other specified  complication (CMS-HCC).   Technique:  CT imaging was performed of the abdomen and pelvis following  the administration of intravenous contrast.  Iodinated contrast was used  due to the indications for the examination, to improve disease detection  and further define anatomy. Coronal and sagittal reformatted images were  generated and reviewed.   Findings:  - Lower Thorax: Bibasilar atelectasis. No pleural or pericardial effusions.  Small hiatal hernia.   - Liver: Normal in morphology and enhancement.  No suspicious hepatic  masses are identified.  The portal and hepatic veins are patent.   - Biliary and Gallbladder: No intrahepatic or extrahepatic bile duct  dilatation. The gallbladder is surgically absent.   - Spleen: Normal in appearance.     - Pancreas: Normal  in appearance.   - Adrenal Glands: Normal in appearance.   - Kidneys: Symmetric in size and enhancement. No suspicious renal lesions.  No hydronephrosis.   - Abdominal and Pelvic Vasculature: No abdominal aortic aneurysm.   - Gastrointestinal Tract: No abnormal dilation or wall thickening. Appendix  is normal. Large stool burden in the rectum.   - Peritoneum/Mesentery/Retroperitoneum: No free fluid.  No free  intraperitoneal air.   - Lymph Nodes: No retroperitoneal, mesenteric, pelvic, or inguinal  lymphadenopathy.     - Bladder: Urinary bladder is decompressed with suprapubic catheter in  place.   - Pelvic Organs: Unremarkable.   - Body Wall: Unremarkable.   - Musculoskeletal:  No aggressive appearing osseous lesions.   Impression:  No acute process in the abdomen or pelvis.   Electronically Reviewed by:  Dorthy Cooler, MD, [facility]  Electronically Reviewed on:  10/27/2021 8:59 AM   I have reviewed the images and concur with the above findings.   Electronically Signed by:  Louie Bun, MD, [facility]  Electronically Signed on:  10/27/2021 1:23 PM Exam End: 10/27/21 03:21   Specimen Collected: 10/27/21 03:13 Last Resulted: 10/27/21 13:23  Received From: [facility]  Result Received: 11/12/21 10:20    Assessment & Plan:    1. rUTI's -Difficult to diagnose as more recent urine cultures have only grown out mixed flora  2. Neurogenic bladder -continue with monthly SPT exchanges   No follow-ups on file.  These notes generated  with voice recognition software. I apologize for typographical errors.  Zara Council, PA-C  [facility] [address]  [place], [place] [address] [phone]

## 2021-11-17 ENCOUNTER — Ambulatory Visit: Payer: Medicare Other | Admitting: Urology

## 2021-11-17 DIAGNOSIS — N390 Urinary tract infection, site not specified: Secondary | ICD-10-CM

## 2021-11-17 DIAGNOSIS — N319 Neuromuscular dysfunction of bladder, unspecified: Secondary | ICD-10-CM

## 2021-12-15 IMAGING — CT CT IMAGE GUIDED DRAINAGE BY PERCUTANEOUS CATHETER
1 of 3 series · 13 of 32 positions shown, 18 images · non-contrast
Comparison: CT of the abdomen and pelvis on 07/01/2016

INDICATION: History of multiple sclerosis and neurogenic bladder with history
prior suprapubic bladder catheter placement. After recent
dislodgement a long-term suprapubic catheter, there has been
inability to replace the catheter through the previous tract.
Request now made to place a new suprapubic catheter via a new tract.

EXAM:
CT IMAGE GUIDED SUPRAPUBIC BLADDER DRAINAGE CATHETER PLACEMENT

[Series 2: i-spiral 5.0 b30f · axial · 0.92mm/px · z∈[+862,+970]mm · 13 of 37 slices shown, 18 images]
[im 3/37  soft-tissue]
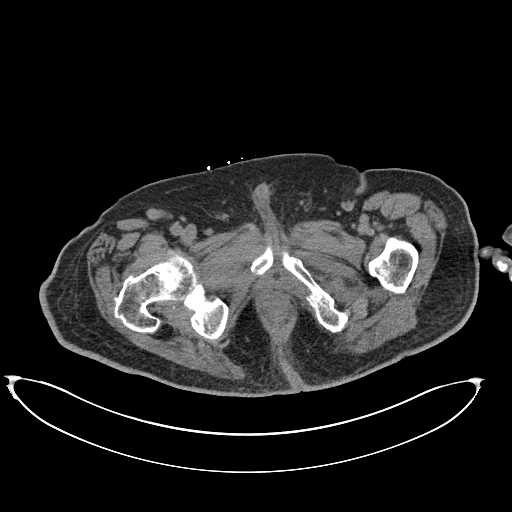
[im 3/37  bone]
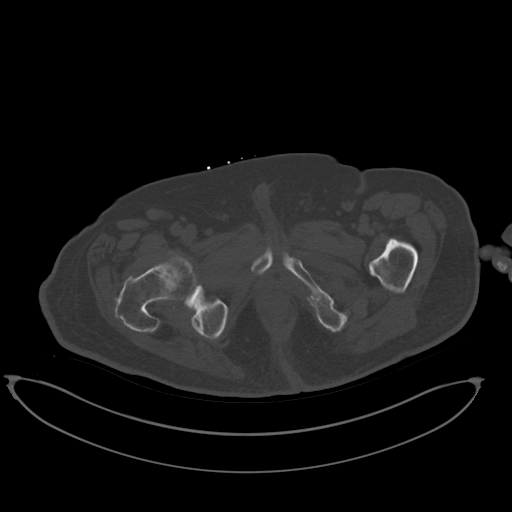
[im 5/37  soft-tissue]
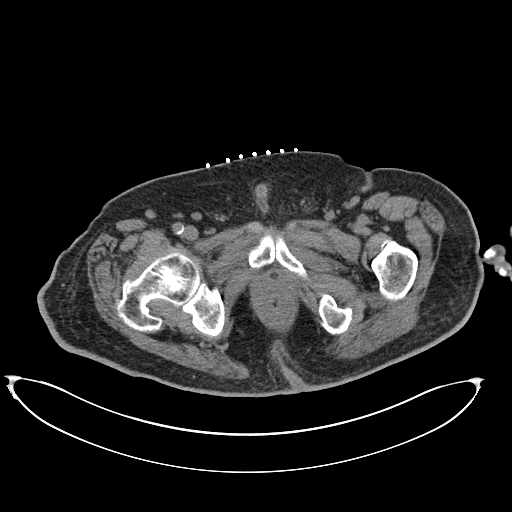
[im 10/37  soft-tissue]
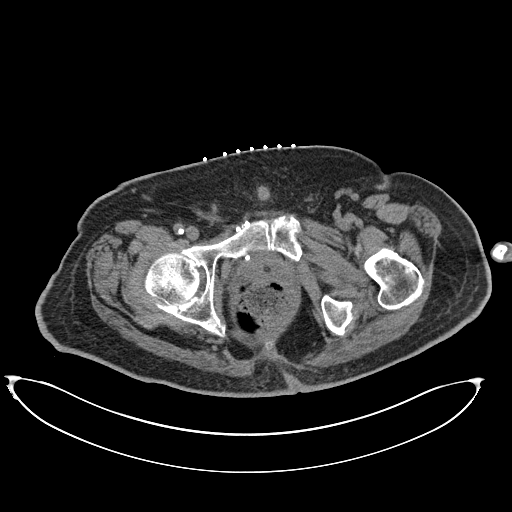
[im 12/37  soft-tissue]
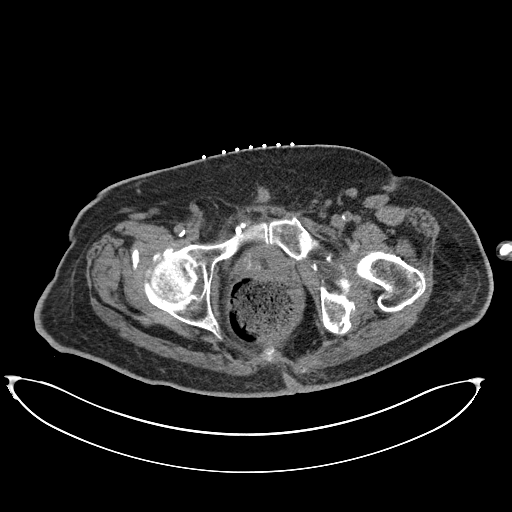
[im 14/37  soft-tissue]
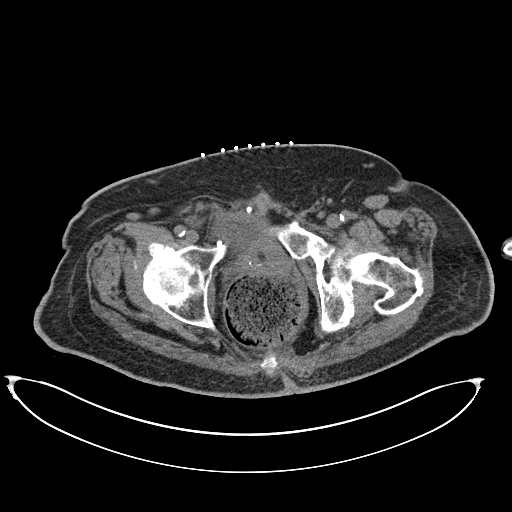
[im 16/37  soft-tissue]
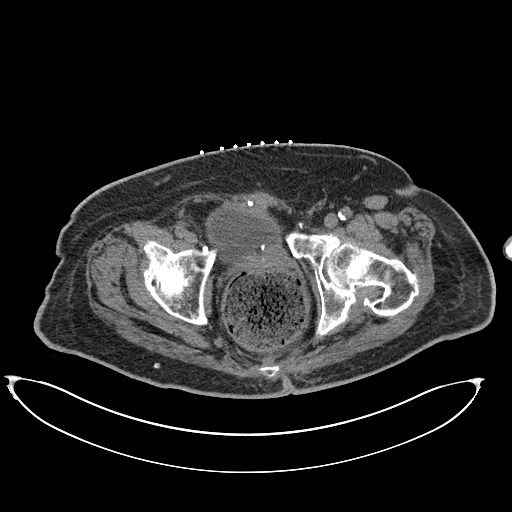
[im 21/37  soft-tissue]
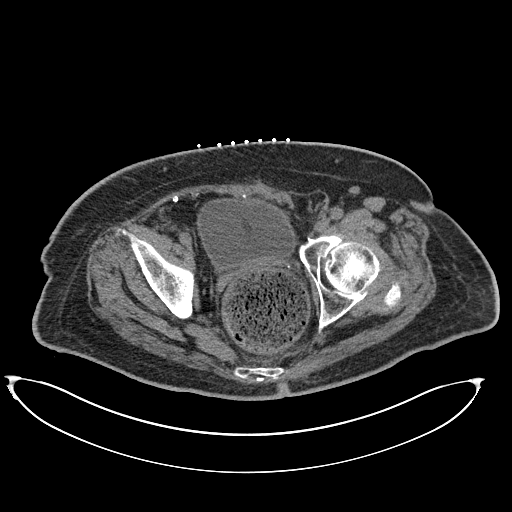
[im 23/37  soft-tissue]
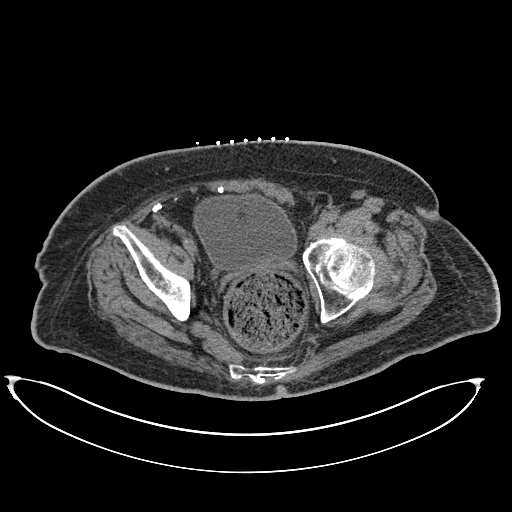
[im 25/37  soft-tissue]
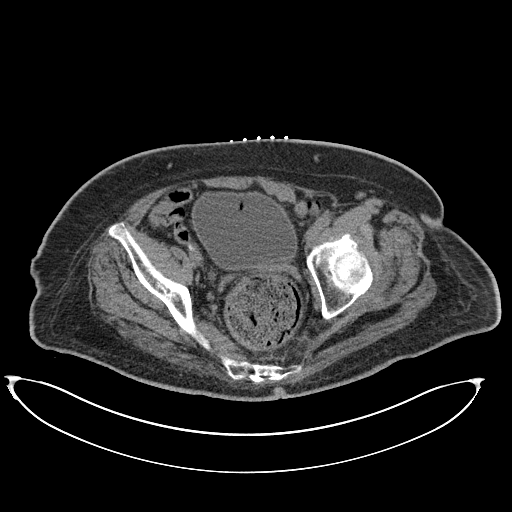
[im 25/37  bone]
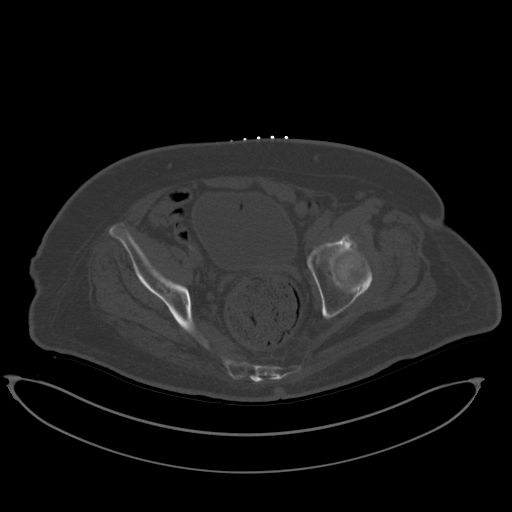
[im 28/37  soft-tissue]
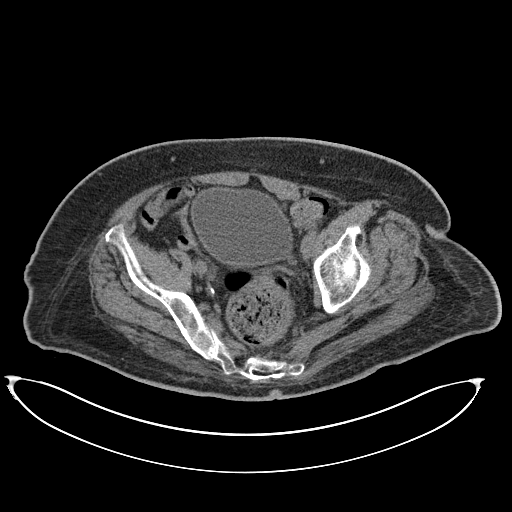
[im 28/37  lung]
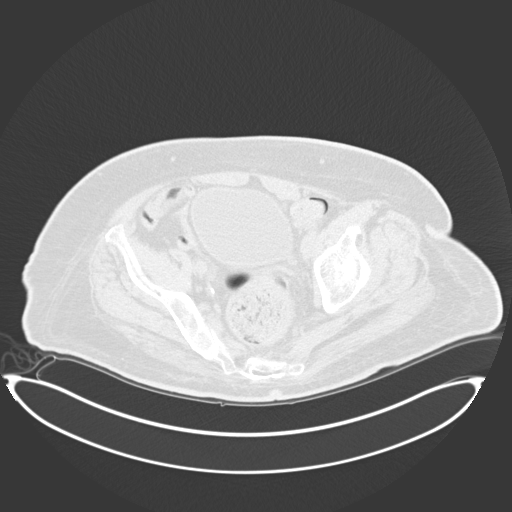
[im 30/37  lung]
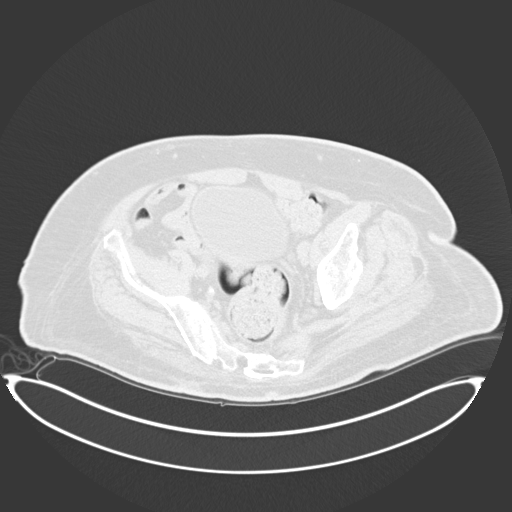
[im 32/37  soft-tissue]
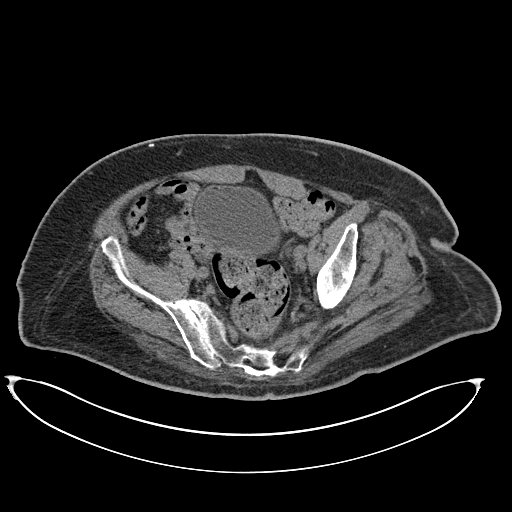
[im 32/37  lung]
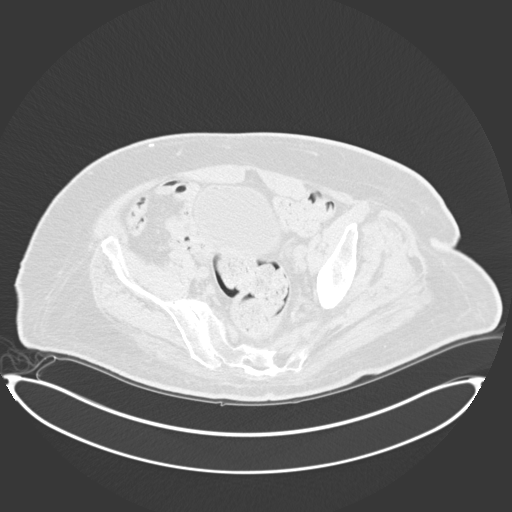
[im 34/37  soft-tissue]
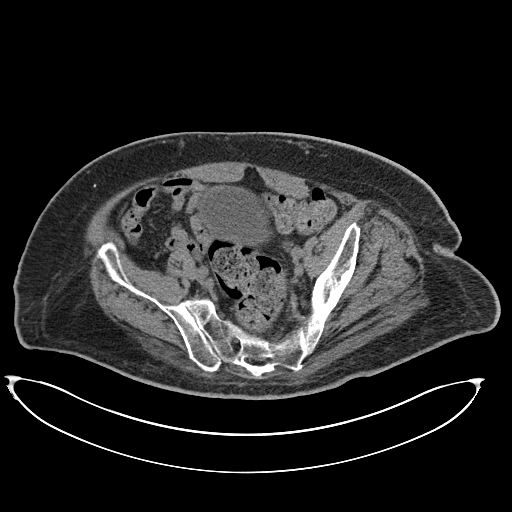
[im 34/37  lung]
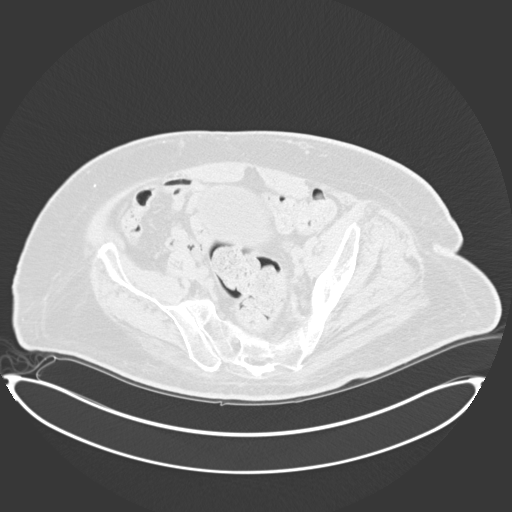

[13 of 32 positions shown; findings below may reference images not displayed]

MEDICATIONS:
Partial administration of IV Cipro. The patient received 2 g IV
Ancef and 25 mg IV Benadryl.

ANESTHESIA/SEDATION:
Fentanyl 25 mcg IV; Versed  mg IV

Moderate Sedation Time:  22 minutes.

The patient was continuously monitored during the procedure by the
interventional radiology nurse under my direct supervision.

CONTRAST:  None

FLUOROSCOPY TIME:  CT guidance

COMPLICATIONS:
None immediate.

PROCEDURE:
Informed written consent was obtained from the patient and his wife
after a thorough discussion of the procedural risks, benefits and
alternatives. All questions were addressed. Maximal Sterile Barrier
Technique was utilized including caps, mask, sterile gowns, sterile
gloves, sterile drape, hand hygiene and skin antiseptic. A timeout
was performed prior to the initiation of the procedure.

An indwelling Foley catheter was clamped for approximately 1 hour
prior to the procedure. Supine imaging through the pelvis was
performed. Under CT guidance, an 18 gauge trocar needle was advanced
into the bladder lumen. After confirming needle tip position, a
guidewire was advanced through the needle. After needle removal the
percutaneous tract was dilated and a 14 French pigtail catheter
advanced into the bladder lumen. The catheter was formed and
attached to a gravity drainage bag. The catheter was secured at the
skin with a Prolene retention suture and StatLock device.
FINDINGS: A new suprapubic catheter was placed into the bladder lumen with
good return of urine. A sample of urine was sent for urinalysis and
urine culture at the request of the patient's wife who is concerned
about a urinary tract infection due to some hematuria and foul odor
of urine. The new suprapubic catheter was placed in the midline well
above the puncture site of the whole catheter and also superior to
indwelling abdominal wall hernia mesh.
IMPRESSION: Placement of new 14 French suprapubic bladder drainage catheter via
a new suprapubic puncture of the bladder. A urine sample was sent
for urinalysis and urine culture. The patient will be scheduled for
tube exchange and up sizing in approximately 4 weeks.

## 2021-12-21 ENCOUNTER — Ambulatory Visit (INDEPENDENT_AMBULATORY_CARE_PROVIDER_SITE_OTHER): Payer: Medicare Other | Admitting: Urology

## 2021-12-21 DIAGNOSIS — R339 Retention of urine, unspecified: Secondary | ICD-10-CM

## 2021-12-21 DIAGNOSIS — Z436 Encounter for attention to other artificial openings of urinary tract: Secondary | ICD-10-CM | POA: Diagnosis not present

## 2021-12-21 DIAGNOSIS — T83010A Breakdown (mechanical) of cystostomy catheter, initial encounter: Secondary | ICD-10-CM

## 2021-12-21 MED ORDER — TRIMETHOPRIM 100 MG PO TABS: 100.0000 mg | ORAL_TABLET | Freq: Every day | ORAL | 11 refills | Status: DC

## 2021-12-21 NOTE — Progress Notes (Signed)
Cath Change/ Replacement  Patient is present today for a catheter change due to urinary retention.  24ml of water was removed from the balloon, a 20 FR foley cath was removed with out difficulty.  Patient was cleaned and prepped in a sterile fashion with betadine. A 20 FR foley cath was replaced into the bladder no complications were noted Urine return was noted 52ml and urine was yellow in color. The balloon was filled with 13ml of sterile water. A  bag was attached for drainage.  A night bag was also given to the patient and patient was given instruction on how to change from one bag to another. Patient was given proper instruction on catheter care.    Performed by: Michiel Cowboy, PA-C   Follow up: Three weeks for SPT exchange.  Change the nitrofurantoin to trimethoprim 100 mg daily as he has been hospitalized twice since April for infections.

## 2022-01-14 ENCOUNTER — Ambulatory Visit (INDEPENDENT_AMBULATORY_CARE_PROVIDER_SITE_OTHER): Payer: Medicare Other | Admitting: Urology

## 2022-01-14 DIAGNOSIS — R339 Retention of urine, unspecified: Secondary | ICD-10-CM | POA: Diagnosis not present

## 2022-01-14 DIAGNOSIS — T83010A Breakdown (mechanical) of cystostomy catheter, initial encounter: Secondary | ICD-10-CM

## 2022-01-14 NOTE — Progress Notes (Signed)
Suprapubic Cath Change  Patient is present today for a suprapubic catheter change due to urinary retention.  3ml of water was drained from the balloon, a 20FR foley cath was removed from the tract with out difficulty.  Site was cleaned and prepped in a sterile fashion with betadine.  A 22 FR foley cath was replaced into the tract no complications were noted. Urine return was noted, 10 ml of sterile water was inflated into the balloon and a  bag was attached for drainage.  Patient tolerated well. A night bag was given to patient and proper instruction was given on how to switch bags.    Performed by: Michiel Cowboy, PA-C and Gerarda Gunther, CMA   Follow up: Return in 1 month for SPT change   I,Kailey Littlejohn,acting as a scribe for [facility], PA-C.,have documented all relevant documentation on the behalf of Jesus Layton, PA-C,as directed by  [employer], PA-C while in the presence of Conan Mcmanaway, PA-C.  I have reviewed the above documentation for accuracy and completeness, and I agree with the above.    Michiel Cowboy, PA-C

## 2022-01-21 ENCOUNTER — Ambulatory Visit: Payer: Medicare Other | Admitting: Urology

## 2022-02-04 ENCOUNTER — Ambulatory Visit: Payer: Medicare Other | Admitting: Physician Assistant

## 2022-02-18 ENCOUNTER — Encounter: Payer: Self-pay | Admitting: Physician Assistant

## 2022-02-18 ENCOUNTER — Ambulatory Visit (INDEPENDENT_AMBULATORY_CARE_PROVIDER_SITE_OTHER): Payer: Medicare Other | Admitting: Physician Assistant

## 2022-02-18 DIAGNOSIS — Z435 Encounter for attention to cystostomy: Secondary | ICD-10-CM | POA: Diagnosis not present

## 2022-02-18 DIAGNOSIS — R339 Retention of urine, unspecified: Secondary | ICD-10-CM

## 2022-02-18 NOTE — Progress Notes (Signed)
Suprapubic Cath Change  Patient is present today for a suprapubic catheter change due to urinary retention.  18ml of water was drained from the balloon, a 22FR foley cath was removed from the tract without difficulty.  Site was cleaned and prepped in a sterile fashion with betadine.  A 22FR foley cath was replaced into the tract no complications were noted. Urine return was noted, 10 ml of sterile water was inflated into the balloon and a night bag was attached for drainage.  Patient tolerated well.    Performed by: Carman Ching, PA-C and DeShannon Katrinka Blazing, CMA  Follow up: Return in about 3 weeks (around 03/11/2022) for SPT exchange.

## 2022-03-08 ENCOUNTER — Ambulatory Visit (INDEPENDENT_AMBULATORY_CARE_PROVIDER_SITE_OTHER): Payer: Medicare Other | Admitting: Urology

## 2022-03-08 DIAGNOSIS — Z435 Encounter for attention to cystostomy: Secondary | ICD-10-CM | POA: Diagnosis not present

## 2022-03-08 DIAGNOSIS — R339 Retention of urine, unspecified: Secondary | ICD-10-CM

## 2022-03-08 NOTE — Progress Notes (Signed)
Suprapubic Cath Change  Patient is present today for a suprapubic catheter change due to urinary retention.  8 ml of water was drained from the balloon, a 22 FR foley cath was removed from the tract with out difficulty.  Site was cleaned and prepped in a sterile fashion with betadine.  A 20 FR foley cath was replaced into the tract no complications were noted. 10 cc Urine return was noted, 10 ml of sterile water was inflated into the balloon and a overnight bag was attached for drainage.  Patient tolerated well. A night bag was given to patient and proper instruction was given on how to switch bags.    Performed by: Michiel Cowboy, PA-C and Martha Clan, CMA   Follow up: Return for 3wk cath exchange.

## 2022-03-28 NOTE — Progress Notes (Unsigned)
Suprapubic Cath Change  Patient is present today for a suprapubic catheter change due to urinary retention.  8 ml of water was drained from the balloon, a 20 FR foley cath was removed from the tract with out difficulty.  Site was cleaned and prepped in a sterile fashion with betadine.  A 22 FR foley cath was replaced into the tract no complications were noted. Urine return was noted, 10 ml of sterile water was inflated into the balloon.  A night bag was given to patient and proper instruction was given on how to switch bags.    Performed by: Michiel Cowboy, PA-C and Luther Hearing, CMA  Follow up: 3 weeks for SPT exchange

## 2022-03-29 ENCOUNTER — Ambulatory Visit (INDEPENDENT_AMBULATORY_CARE_PROVIDER_SITE_OTHER): Payer: Medicare Other | Admitting: Urology

## 2022-03-29 ENCOUNTER — Encounter: Payer: Self-pay | Admitting: Urology

## 2022-03-29 VITALS — BP 126/77 | HR 90

## 2022-03-29 DIAGNOSIS — R339 Retention of urine, unspecified: Secondary | ICD-10-CM

## 2022-03-29 DIAGNOSIS — Z435 Encounter for attention to cystostomy: Secondary | ICD-10-CM

## 2022-04-17 NOTE — Progress Notes (Unsigned)
Suprapubic Cath Change  Patient is present today for a suprapubic catheter change due to urinary retention.  8 ml of water was drained from the balloon, a 22 FR foley cath was removed from the tract with out difficulty.  Site was cleaned and prepped in a sterile fashion with betadine.  A 22 FR foley cath was replaced into the tract no complications were noted. Urine return was noted, 10 ml of sterile water was inflated into the balloon and a night bag was attached for drainage.  Patient tolerated well.   Performed by: Michiel Cowboy, PA-C   Follow up: Three weeks for SPT exchange

## 2022-04-19 ENCOUNTER — Encounter: Payer: Self-pay | Admitting: Urology

## 2022-04-19 ENCOUNTER — Ambulatory Visit (INDEPENDENT_AMBULATORY_CARE_PROVIDER_SITE_OTHER): Payer: Medicare Other | Admitting: Urology

## 2022-04-19 VITALS — BP 166/73 | HR 61 | Ht 69.0 in

## 2022-04-19 DIAGNOSIS — R339 Retention of urine, unspecified: Secondary | ICD-10-CM | POA: Diagnosis not present

## 2022-04-19 DIAGNOSIS — T83010A Breakdown (mechanical) of cystostomy catheter, initial encounter: Secondary | ICD-10-CM

## 2022-05-14 NOTE — Progress Notes (Deleted)
Suprapubic Cath Change  Patient is present today for a suprapubic catheter change due to urinary retention.  8 ml of water was drained from the balloon, a 16 FR foley cath was removed from the tract with out difficulty.  Site was cleaned and prepped in a sterile fashion with betadine.  A 16 FR foley cath was replaced into the tract no complications were noted. Urine return was noted, 10 ml of sterile water was inflated into the balloon and a leg bag was attached for drainage.  Patient tolerated well.   Performed by: Michole Lecuyer, PA-C   Follow up: One month for SPT exchange  

## 2022-05-17 ENCOUNTER — Encounter (INDEPENDENT_AMBULATORY_CARE_PROVIDER_SITE_OTHER): Payer: Self-pay

## 2022-05-17 ENCOUNTER — Ambulatory Visit: Payer: Medicare Other | Admitting: Urology

## 2022-05-17 ENCOUNTER — Emergency Department
Admission: EM | Admit: 2022-05-17 | Discharge: 2022-05-17 | Disposition: A | Discharge status: Home / Self Care | Payer: Medicare Other | Attending: Emergency Medicine | Admitting: Emergency Medicine

## 2022-05-17 ENCOUNTER — Encounter: Payer: Self-pay | Admitting: Emergency Medicine

## 2022-05-17 ENCOUNTER — Other Ambulatory Visit: Payer: Self-pay

## 2022-05-17 DIAGNOSIS — I2510 Atherosclerotic heart disease of native coronary artery without angina pectoris: Secondary | ICD-10-CM | POA: Insufficient documentation

## 2022-05-17 DIAGNOSIS — I10 Essential (primary) hypertension: Secondary | ICD-10-CM | POA: Insufficient documentation

## 2022-05-17 DIAGNOSIS — K08409 Partial loss of teeth, unspecified cause, unspecified class: Secondary | ICD-10-CM

## 2022-05-17 DIAGNOSIS — K91840 Postprocedural hemorrhage and hematoma of a digestive system organ or structure following a digestive system procedure: Secondary | ICD-10-CM | POA: Insufficient documentation

## 2022-05-17 DIAGNOSIS — R58 Hemorrhage, not elsewhere classified: Secondary | ICD-10-CM

## 2022-05-17 DIAGNOSIS — T83010A Breakdown (mechanical) of cystostomy catheter, initial encounter: Secondary | ICD-10-CM

## 2022-05-17 MED ORDER — NALOXONE HCL 2 MG/2ML IJ SOSY
0.4000 mg | PREFILLED_SYRINGE | Freq: Once | INTRAMUSCULAR | Status: DC
  Filled 2022-05-17: qty 2

## 2022-05-17 MED ORDER — TRANEXAMIC ACID FOR EPISTAXIS
500.0000 mg | Freq: Once | TOPICAL | Status: AC
  Administered 2022-05-17: 500 mg via TOPICAL
  Filled 2022-05-17: qty 10

## 2022-05-17 MED ORDER — LIDOCAINE-EPINEPHRINE 1 %-1:100000 IJ SOLN
10.0000 mL | Freq: Once | INTRAMUSCULAR | Status: AC
Start: 2022-05-17 — End: 2022-05-17
  Administered 2022-05-17: 10 mL

## 2022-05-17 MED ORDER — TRAMADOL HCL 50 MG PO TABS
50.0000 mg | ORAL_TABLET | Freq: Once | ORAL | Status: AC
  Administered 2022-05-17: 50 mg via ORAL
  Filled 2022-05-17: qty 1

## 2022-05-17 MED ORDER — MICROFIBRILLAR COLL HEMOSTAT EX POWD
1.0000 g | Freq: Once | CUTANEOUS | Status: DC
  Filled 2022-05-17: qty 5

## 2022-05-17 MED ORDER — SILVER NITRATE-POT NITRATE 75-25 % EX MISC
5.0000 | Freq: Once | CUTANEOUS | Status: AC
  Administered 2022-05-17: 5 via TOPICAL

## 2022-05-17 NOTE — Discharge Instructions (Signed)
Please avoid solid foods for the next 7 days.  Return to your dentist in the next several days for recheck/reevaluation.  If you begin to have significant bleeding from the area please return to the emergency department.

## 2022-05-17 NOTE — ED Provider Notes (Signed)
[facility] Provider Note    Event Date/Time   First MD Initiated Contact with Patient 05/17/22 1119     (approximate)  History   Chief Complaint: Dental Problem  HPI  Lonald Troiani is a 71 y.o. male with a past medical history of CAD, gastric reflux, hypertension presents to the emergency department for dental bleeding.  According to the patient and EMS report patient had a left lower molar removed this morning at his dental office.  After 1 hour of trying to get the bleeding to stop and being unsuccessful so they sent the patient to the emergency department for evaluation.  Physical Exam   Triage Vital Signs: ED Triage Vitals [05/17/22 1125]  Enc Vitals Group     BP      Pulse      Resp      Temp      Temp src      SpO2      Weight 179 lb 14.3 oz (81.6 kg)     Height 5\' 9"  (1.753 m)     Head Circumference      Peak Flow      Pain Score 0     Pain Loc      Pain Edu?      Excl. in [place]?     Most recent vital signs: There were no vitals filed for this visit.  General: Awake, no distress.  CV:  Good peripheral perfusion.  Regular rate and rhythm  Resp:  Normal effort.  Equal breath sounds bilaterally.  Abd:  No distention.  Soft, nontender.  No rebound or guarding. Other:  Patient has a small appears to be possibly arterial bleed from the left lower molar extraction site.   ED Results / Procedures / Treatments   MEDICATIONS ORDERED IN ED: Medications - No data to display   IMPRESSION / MDM / [place] / ED COURSE  I reviewed the triage vital signs and the nursing notes.  Patient's presentation is most consistent with acute presentation with potential threat to life or bodily function.  Patient presents emergency department for bleeding from a dental extraction.  Patient had a left lower molar extracted this morning after 1 hour being unsuccessful in stopping the bleeding they sent the patient to the emergency department.   Here patient appears to have a small bleed possibly arterial into this area.  Sitting up in bed, no distress but frequently spitting blood into a container.  I have placed a small amount of Surgicel over the extraction site placed gauze within the extraction site and having the patient apply pressure by biting down.  We will recheck in approximately 10 minutes.  Tried packing the wound with gauze, no reduction in bleeding.  I then injected the extraction site with TXA, slight reduction in bleeding, packed the area again.  Checked the area approximately 10 to 15 minutes later still bleeding although less so than at first.  Packed the area with Surgicel and then gauze for pressure once again.  On recheck after 10 to 15 minutes continues to bleed.  I injected the area with epinephrine with lidocaine, and used several silver nitrate sticks for chemical cauterization.  After this noted fairly significant reduction in bleeding which is very mild oozing currently.  Replace the Surgicel and have the patient bite.  After approximately 10 to 15 minutes checked and the wound appears largely hemostatic, Surgicel removed wound was irrigated remains largely hemostatic with there  is a very slight amount of oozing.  Patient currently biting down on gauze to apply pressure to the wound we will recheck in 10 to 15 minutes.  Patient's wound has remained hemostatic for approximately 1 hour now.  The patient is requesting to be discharged home.  Patient is requesting something for pain while he is in the emergency department.  We will dose one-time dose of tramadol we will discharge patient with dental follow-up.  Discussed returning to the emergency department for any significant bleed.  FINAL CLINICAL IMPRESSION(S) / ED DIAGNOSES   Dental extraction bleed    Note:  This document was prepared using Dragon voice recognition software and may include unintentional dictation errors.   Harvest Dark, MD 05/17/22  1352

## 2022-05-17 NOTE — ED Notes (Signed)
Checked on patient. Patients states bleeding has slowed down. Patient made award of waiting of medication from pharmacy. Pharmacy called and sent a chat message to obtain Avitene. Wife in room. Patient alert and oriented,.

## 2022-05-17 NOTE — ED Triage Notes (Signed)
Presents via EMS from dental office  States he went to have tooth pulled this am  States he has been bleeding for about 1 hour

## 2022-05-28 NOTE — Progress Notes (Unsigned)
Suprapubic Cath Change  Patient is present today for a suprapubic catheter change due to urinary retention.  8 ml of water was drained from the balloon, a 22 FR Silastic foley cath was removed from the tract with out difficulty.  Site was cleaned and prepped in a sterile fashion with betadine.  A 22 FR foley cath was replaced into the tract no complications were noted. Urine return was noted, 10 ml of sterile water was inflated into the balloon and a night bag was attached for drainage.  Patient tolerated well. A night bag was given to patient and proper instruction was given on how to switch bags.    Performed by: Michiel Cowboy, PA-C and Luther Hearing, CMA   Follow up: Patient's wife, Britta Mccreedy, has concerns regarding her husband's lower platelet clots and incidences of excessive bleeding without finding a definite cause.  1 such case was last October where he had gross hematuria that was unable to be controlled in the office and he was admitted for several days, but no definite cause was identified.  More recently, he had a tooth extraction which continued to bleed and ended up in the emergency room and they had a difficult time stopping the bleeding.  He does receive services through [facility] system, so I placed a referral to [facility] hematology so that he can have an appointment with them to rule out any hematological disorder.  He will return with [place] in 3 weeks for suprapubic catheter exchange.

## 2022-05-31 ENCOUNTER — Ambulatory Visit (INDEPENDENT_AMBULATORY_CARE_PROVIDER_SITE_OTHER): Payer: Medicare Other | Admitting: Urology

## 2022-05-31 DIAGNOSIS — R58 Hemorrhage, not elsewhere classified: Secondary | ICD-10-CM

## 2022-05-31 DIAGNOSIS — R339 Retention of urine, unspecified: Secondary | ICD-10-CM | POA: Diagnosis not present

## 2022-05-31 DIAGNOSIS — T83010A Breakdown (mechanical) of cystostomy catheter, initial encounter: Secondary | ICD-10-CM

## 2022-06-25 NOTE — Progress Notes (Unsigned)
Suprapubic Cath Change  Patient is present today for a suprapubic catheter change due to urinary retention.  8 ml of water was drained from the balloon, a 22 FR foley cath was removed from the tract with out difficulty.  Site was cleaned and prepped in a sterile fashion with betadine.  A 22 FR foley cath was replaced into the tract no complications were noted. Urine return was noted, 10 ml of sterile water was inflated into the balloon and a night bag was attached for drainage.  Patient tolerated well.   Performed by: Michiel Cowboy, PA-C and Luther Hearing, CMA  Follow up: 3 weeks for SPT exchange

## 2022-06-28 ENCOUNTER — Ambulatory Visit (INDEPENDENT_AMBULATORY_CARE_PROVIDER_SITE_OTHER): Payer: Medicare Other | Admitting: Urology

## 2022-06-28 DIAGNOSIS — T83010A Breakdown (mechanical) of cystostomy catheter, initial encounter: Secondary | ICD-10-CM

## 2022-06-28 DIAGNOSIS — R339 Retention of urine, unspecified: Secondary | ICD-10-CM

## 2022-06-28 MED ORDER — TRIMETHOPRIM 100 MG PO TABS: 100.0000 mg | ORAL_TABLET | Freq: Every day | ORAL | 11 refills | Status: DC

## 2022-07-23 NOTE — Progress Notes (Deleted)
Suprapubic Cath Change  Patient is present today for a suprapubic catheter change due to urinary retention.  8 ml of water was drained from the balloon, a 16 FR foley cath was removed from the tract with out difficulty.  Site was cleaned and prepped in a sterile fashion with betadine.  A 16 FR foley cath was replaced into the tract no complications were noted. Urine return was noted, 10 ml of sterile water was inflated into the balloon and a leg bag was attached for drainage.  Patient tolerated well.   Performed by: Elynor Kallenberger, PA-C   Follow up: One month for SPT exchange  

## 2022-07-26 ENCOUNTER — Ambulatory Visit: Payer: Medicare Other | Admitting: Urology

## 2022-07-26 DIAGNOSIS — T83010A Breakdown (mechanical) of cystostomy catheter, initial encounter: Secondary | ICD-10-CM

## 2022-08-02 ENCOUNTER — Telehealth: Payer: Self-pay | Admitting: Urology

## 2022-08-02 NOTE — Telephone Encounter (Signed)
He missed his catheter exchange last week because his wheelchair was broken.  They haven't called back to reschedule the appointment.  Would you call them and see if they are ready to come in now or if they need a catheter?  His wife can exchange the SPT at home if he needs it.

## 2022-08-03 NOTE — Telephone Encounter (Signed)
I spoke to patient's wife, his chair is still broken. They have ordered the part. She has all the supplies she needs to hange the catheter. She will change it and schedule a follow up in 1 month.

## 2022-08-06 NOTE — Telephone Encounter (Signed)
Pt's wife will stop by [facility] today to pick up a cath kit.

## 2022-08-06 NOTE — Telephone Encounter (Signed)
Wife calling stating that she has everything to change the catheter except the cath kit. Wife is asking if she can stop by [facility] to pick up a kit? Please advise

## 2022-08-06 NOTE — Telephone Encounter (Signed)
Cath kit placed with reception at front desk.

## 2022-09-14 NOTE — Progress Notes (Unsigned)
Suprapubic Cath Change  Patient is present today for a suprapubic catheter change due to urinary retention.  8 ml of water was drained from the balloon, a 24 FR foley cath was removed from the tract with out difficulty.  Site was cleaned and prepped in a sterile fashion with betadine.  A 24 FR foley cath was replaced into the tract no complications were noted. Urine return was noted, 10 ml of sterile water was inflated into the balloon and a night bag was attached for drainage.  Patient tolerated well.   Performed by: Michiel Cowboy, PA-C   Follow up: 3 weeks for SPT exchange

## 2022-09-15 ENCOUNTER — Encounter: Payer: Self-pay | Admitting: Urology

## 2022-09-15 ENCOUNTER — Ambulatory Visit: Payer: Medicare Other | Admitting: Urology

## 2022-09-15 VITALS — BP 152/67 | HR 82 | Ht 69.0 in

## 2022-09-15 DIAGNOSIS — T83010A Breakdown (mechanical) of cystostomy catheter, initial encounter: Secondary | ICD-10-CM

## 2022-09-15 DIAGNOSIS — R339 Retention of urine, unspecified: Secondary | ICD-10-CM

## 2022-10-08 NOTE — Progress Notes (Unsigned)
Suprapubic Cath Change  Patient is present today for a suprapubic catheter change due to urinary retention.  9 ml of water was drained from the balloon, a 20 FR foley cath was removed from the tract with out difficulty.  Site was cleaned and prepped in a sterile fashion with betadine.  A 20 FR foley cath was replaced into the tract no complications were noted. Urine return was noted, 10 ml of sterile water was inflated into the balloon and a night bag was attached for drainage.  Patient tolerated well.   Performed by: Zara Council, PA-C and Kyra Manges, CMA  Follow up: 3 weeks for SPT exchange

## 2022-10-11 ENCOUNTER — Ambulatory Visit (INDEPENDENT_AMBULATORY_CARE_PROVIDER_SITE_OTHER): Payer: Medicare Other | Admitting: Urology

## 2022-10-11 ENCOUNTER — Encounter: Payer: Self-pay | Admitting: Urology

## 2022-10-11 DIAGNOSIS — T83010A Breakdown (mechanical) of cystostomy catheter, initial encounter: Secondary | ICD-10-CM

## 2022-10-11 DIAGNOSIS — R339 Retention of urine, unspecified: Secondary | ICD-10-CM | POA: Diagnosis not present

## 2022-10-31 NOTE — Progress Notes (Unsigned)
Suprapubic Cath Change  Patient is present today for a suprapubic catheter change due to urinary retention.  9 ml of water was drained from the balloon, a 24 FR foley cath was removed from the tract with out difficulty.  Site was cleaned and prepped in a sterile fashion with betadine.  A 20 FR foley cath was replaced into the tract no complications were noted. Urine return was noted, 10 ml of sterile water was inflated into the balloon and a night bag was attached for drainage.  Patient tolerated well.   Performed by: Michiel Cowboy, PA-C   Follow up: One month for SPT exchange

## 2022-11-01 ENCOUNTER — Ambulatory Visit (INDEPENDENT_AMBULATORY_CARE_PROVIDER_SITE_OTHER): Payer: Medicare Other | Admitting: Urology

## 2022-11-01 DIAGNOSIS — R339 Retention of urine, unspecified: Secondary | ICD-10-CM | POA: Diagnosis not present

## 2022-11-01 DIAGNOSIS — T83010A Breakdown (mechanical) of cystostomy catheter, initial encounter: Secondary | ICD-10-CM

## 2022-11-21 NOTE — Progress Notes (Unsigned)
Suprapubic Cath Change  Patient is present today for a suprapubic catheter change due to urinary retention.  8 ml of water was drained from the balloon, a 20 FR foley cath was removed from the tract with out difficulty.  Site was cleaned and prepped in a sterile fashion with betadine.  A 22 FR foley cath was replaced into the tract no complications were noted. Urine return was noted, 10 ml of sterile water was inflated into the balloon and a night bag was attached for drainage.  Patient tolerated well.   Performed by: Dutch Quint, PA-S, Hetal Proano, PA-C and Humberta Magallon-Mariche, CMA   Follow up: 3 weeks for SPT exchange

## 2022-11-22 ENCOUNTER — Encounter: Payer: Self-pay | Admitting: Urology

## 2022-11-22 ENCOUNTER — Ambulatory Visit (INDEPENDENT_AMBULATORY_CARE_PROVIDER_SITE_OTHER): Payer: Medicare Other | Admitting: Urology

## 2022-11-22 ENCOUNTER — Other Ambulatory Visit: Payer: Self-pay

## 2022-11-22 VITALS — BP 161/78 | HR 81 | Ht 69.0 in | Wt 179.0 lb

## 2022-11-22 DIAGNOSIS — N319 Neuromuscular dysfunction of bladder, unspecified: Secondary | ICD-10-CM

## 2022-11-22 DIAGNOSIS — R339 Retention of urine, unspecified: Secondary | ICD-10-CM

## 2022-11-22 DIAGNOSIS — T83010A Breakdown (mechanical) of cystostomy catheter, initial encounter: Secondary | ICD-10-CM

## 2022-11-22 MED ORDER — TRIMETHOPRIM 100 MG PO TABS: 100.0000 mg | ORAL_TABLET | Freq: Every day | ORAL | 11 refills | Status: DC

## 2022-11-22 MED ORDER — OXYBUTYNIN CHLORIDE 5 MG PO TABS: 5.0000 mg | ORAL_TABLET | Freq: Three times a day (TID) | ORAL | 3 refills | Status: DC | PRN

## 2022-12-14 NOTE — Progress Notes (Deleted)
Suprapubic Cath Change  Patient is present today for a suprapubic catheter change due to urinary retention.  ***ml of water was drained from the balloon, a 22 FR foley cath was removed from the tract with out difficulty.  Site was cleaned and prepped in a sterile fashion with betadine.  A 24 FR Silastic foley cath was replaced into the tract {dnt complications:20057}. Urine return was noted, 10 ml of sterile water was inflated into the balloon and a *** bag was attached for drainage.  Patient tolerated well. A night bag was given to patient and proper instruction was given on how to switch bags.    Performed by: ***  Follow up: No follow-ups on file.

## 2022-12-15 ENCOUNTER — Ambulatory Visit: Payer: Medicare Other | Admitting: Urology

## 2022-12-15 DIAGNOSIS — T83010A Breakdown (mechanical) of cystostomy catheter, initial encounter: Secondary | ICD-10-CM

## 2022-12-17 NOTE — Progress Notes (Unsigned)
Suprapubic Cath Change  Patient is present today for a suprapubic catheter change due to urinary retention.  9 ml of water was drained from the balloon, a 22 FR foley cath was removed from the tract with out difficulty.  Site was cleaned and prepped in a sterile fashion with betadine.  A 24 FR Silastic foley cath was replaced into the tract no complications were noted. Urine return was noted, 10 ml of sterile water was inflated into the balloon and a night bag was attached for drainage.  Patient tolerated well.   Performed by: Michiel Cowboy, PA-C and Luther Hearing, CMA   Follow up: Return in about 3 weeks (around 01/10/2023) for SPT exchange .

## 2022-12-20 ENCOUNTER — Encounter: Payer: Self-pay | Admitting: Urology

## 2022-12-20 ENCOUNTER — Ambulatory Visit (INDEPENDENT_AMBULATORY_CARE_PROVIDER_SITE_OTHER): Payer: Medicare Other | Admitting: Urology

## 2022-12-20 VITALS — BP 147/92 | HR 92

## 2022-12-20 DIAGNOSIS — R339 Retention of urine, unspecified: Secondary | ICD-10-CM | POA: Diagnosis not present

## 2022-12-20 DIAGNOSIS — T83010A Breakdown (mechanical) of cystostomy catheter, initial encounter: Secondary | ICD-10-CM

## 2023-01-16 NOTE — Progress Notes (Unsigned)
Suprapubic Cath Change  Patient is present today for a suprapubic catheter change due to urinary retention.  ***ml of water was drained from the balloon, a ***FR foley cath was removed from the tract with out difficulty.  Site was cleaned and prepped in a sterile fashion with betadine.  A ***FR foley cath was replaced into the tract {dnt complications:20057}. Urine return was noted, 10 ml of sterile water was inflated into the balloon and a *** bag was attached for drainage.  Patient tolerated well. A night bag was given to patient and proper instruction was given on how to switch bags.    Performed by: ***  Follow up: No follow-ups on file.  

## 2023-01-17 ENCOUNTER — Ambulatory Visit (INDEPENDENT_AMBULATORY_CARE_PROVIDER_SITE_OTHER): Payer: Medicare Other | Admitting: Urology

## 2023-01-17 DIAGNOSIS — T83010A Breakdown (mechanical) of cystostomy catheter, initial encounter: Secondary | ICD-10-CM

## 2023-01-17 DIAGNOSIS — R339 Retention of urine, unspecified: Secondary | ICD-10-CM | POA: Diagnosis not present

## 2023-01-27 ENCOUNTER — Other Ambulatory Visit: Payer: Self-pay | Admitting: Urology

## 2023-01-27 DIAGNOSIS — N319 Neuromuscular dysfunction of bladder, unspecified: Secondary | ICD-10-CM

## 2023-02-11 NOTE — Progress Notes (Signed)
Suprapubic Cath Change  Patient is present today for a suprapubic catheter change due to urinary retention.  8 ml of water was drained from the balloon, a 24 FR Silastic foley cath was removed from the tract with out difficulty.  Site was cleaned and prepped in a sterile fashion with betadine.  A 24 FR Silastic foley cath was replaced into the tract no complications were noted. Urine return was noted, 10 ml of sterile water was inflated into the balloon and a night bag was attached for drainage.  Patient tolerated well.   Performed by: Michiel Cowboy, PA-C   Follow up: Follow up in one month for SPT exhange

## 2023-02-14 ENCOUNTER — Ambulatory Visit (INDEPENDENT_AMBULATORY_CARE_PROVIDER_SITE_OTHER): Payer: Medicare Other | Admitting: Urology

## 2023-02-14 ENCOUNTER — Encounter: Payer: Medicare Other | Admitting: Urology

## 2023-02-14 DIAGNOSIS — R339 Retention of urine, unspecified: Secondary | ICD-10-CM | POA: Diagnosis not present

## 2023-03-11 NOTE — Progress Notes (Unsigned)
Suprapubic Cath Change  Patient is present today for a suprapubic catheter change due to urinary retention.  9 ml of water was drained from the balloon, a 24 FR Silastic foley cath was removed from the tract with out difficulty.  Site was cleaned and prepped in a sterile fashion with betadine.  A 24 Silastic FR foley cath was replaced into the tract {dnt complications:20057}. Urine return was noted, 10 ml of sterile water was inflated into the balloon and a night bag was attached for drainage.  Patient tolerated well.   Performed by: ***  Follow up: No follow-ups on file.

## 2023-03-14 ENCOUNTER — Ambulatory Visit (INDEPENDENT_AMBULATORY_CARE_PROVIDER_SITE_OTHER): Payer: Medicare Other | Admitting: Urology

## 2023-03-14 DIAGNOSIS — R339 Retention of urine, unspecified: Secondary | ICD-10-CM

## 2023-03-14 DIAGNOSIS — T83010A Breakdown (mechanical) of cystostomy catheter, initial encounter: Secondary | ICD-10-CM

## 2023-04-10 NOTE — Progress Notes (Unsigned)
Error

## 2023-04-11 ENCOUNTER — Encounter: Payer: Self-pay | Admitting: Urology

## 2023-04-11 ENCOUNTER — Ambulatory Visit (INDEPENDENT_AMBULATORY_CARE_PROVIDER_SITE_OTHER): Payer: Medicare Other | Admitting: Urology

## 2023-04-11 ENCOUNTER — Other Ambulatory Visit
Admission: RE | Admit: 2023-04-11 | Discharge: 2023-04-11 | Disposition: A | Discharge status: Home / Self Care | Payer: Medicare Other | Attending: Urology | Admitting: Urology

## 2023-04-11 DIAGNOSIS — R339 Retention of urine, unspecified: Secondary | ICD-10-CM | POA: Diagnosis not present

## 2023-04-11 DIAGNOSIS — R3989 Other symptoms and signs involving the genitourinary system: Secondary | ICD-10-CM | POA: Insufficient documentation

## 2023-04-11 LAB — URINALYSIS, COMPLETE (UACMP) WITH MICROSCOPIC
Bilirubin Urine: NEGATIVE
Glucose, UA: NEGATIVE mg/dL
Ketones, ur: NEGATIVE mg/dL
Nitrite: POSITIVE — AB
Protein, ur: 100 mg/dL — AB
Specific Gravity, Urine: 1.015 (ref 1.005–1.030)
Squamous Epithelial / HPF: NONE SEEN /HPF (ref 0–5)
pH: >9 — ABNORMAL HIGH (ref 5.0–8.0)

## 2023-04-11 MED ORDER — CEFUROXIME AXETIL 500 MG PO TABS: 500.0000 mg | ORAL_TABLET | Freq: Two times a day (BID) | ORAL | 0 refills | Status: DC

## 2023-04-11 MED ORDER — CEFTRIAXONE SODIUM 500 MG IJ SOLR
500.0000 mg | Freq: Once | INTRAMUSCULAR | Status: AC
  Administered 2023-04-11: 500 mg via INTRAMUSCULAR

## 2023-04-13 ENCOUNTER — Other Ambulatory Visit: Payer: Self-pay | Admitting: Urology

## 2023-04-13 ENCOUNTER — Telehealth: Payer: Self-pay | Admitting: Urology

## 2023-04-13 DIAGNOSIS — R3989 Other symptoms and signs involving the genitourinary system: Secondary | ICD-10-CM

## 2023-04-13 LAB — URINE CULTURE: Culture: >=100000 — AB

## 2023-04-13 MED ORDER — FOSFOMYCIN TROMETHAMINE 3 G PO PACK: PACK | ORAL | 2 refills | Status: DC

## 2023-04-13 NOTE — Telephone Encounter (Signed)
Advised Jesus Sanchez that the sensitivities indicate that fosfomycin is the only oral alternative that he can take as he is allergic to Cipro.  It will be expensive, so I advised her to ask [facility] pharmacy to run it at the [facility] price for $30 a packet.  He will take 3 packets every other day for 3 doses

## 2023-04-15 ENCOUNTER — Other Ambulatory Visit: Payer: Self-pay | Admitting: Urology

## 2023-04-15 DIAGNOSIS — N319 Neuromuscular dysfunction of bladder, unspecified: Secondary | ICD-10-CM

## 2023-05-05 NOTE — Progress Notes (Deleted)
Suprapubic Cath Change  Patient is present today for a suprapubic catheter change due to urinary retention.  8 ml of water was drained from the balloon, a 24 FR Silastic foley cath was removed from the tract with out difficulty.  Site was cleaned and prepped in a sterile fashion with betadine.  A 24 FR Silastic foley cath was replaced into the tract no complications were noted. Urine return was noted, 10 ml of sterile water was inflated into the balloon and a night bag was attached for drainage.  Patient tolerated well.   Performed by: Michiel Cowboy, PA-C and Luther Hearing, CMA ***  Follow up: No follow-ups on file.

## 2023-05-09 ENCOUNTER — Ambulatory Visit: Payer: Medicare Other | Admitting: Urology

## 2023-05-09 DIAGNOSIS — T83010A Breakdown (mechanical) of cystostomy catheter, initial encounter: Secondary | ICD-10-CM

## 2023-05-18 NOTE — Progress Notes (Unsigned)
Suprapubic Cath Change  Patient is present today for a suprapubic catheter change due to urinary retention.  8 ml of water was drained from the balloon, a 18 FR foley cath was removed from the tract with out difficulty.  Site was cleaned and prepped in a sterile fashion with betadine.  A 24 Silastic FR foley cath was replaced into the tract no complications were noted. Urine return was noted, 10 ml of sterile water was inflated into the balloon and a night bag was attached for drainage.  Patient tolerated well.   Performed by: Michiel Cowboy, PA-C and [facility], CMA   Follow up: Return in about 1 month (around 06/18/2023) for SPT exchange .

## 2023-05-19 ENCOUNTER — Encounter: Payer: Self-pay | Admitting: Urology

## 2023-05-19 ENCOUNTER — Ambulatory Visit (INDEPENDENT_AMBULATORY_CARE_PROVIDER_SITE_OTHER): Payer: Medicare Other | Admitting: Urology

## 2023-05-19 VITALS — BP 166/82 | HR 70

## 2023-05-19 DIAGNOSIS — R339 Retention of urine, unspecified: Secondary | ICD-10-CM

## 2023-05-19 DIAGNOSIS — T83010A Breakdown (mechanical) of cystostomy catheter, initial encounter: Secondary | ICD-10-CM

## 2023-06-07 IMAGING — US US PELVIS LIMITED
1 series · 14 of 22 positions shown · non-contrast
Comparison: February 07, 2020.

CLINICAL DATA: Gross hematuria.

EXAM:
LIMITED ULTRASOUND OF PELVIS
TECHNIQUE: Limited transabdominal ultrasound examination of the pelvis was
performed.

[Series 1: us pelvis limited (transabdominal only) · 22 acquisitions, 14 frames shown]
[im 1/22]
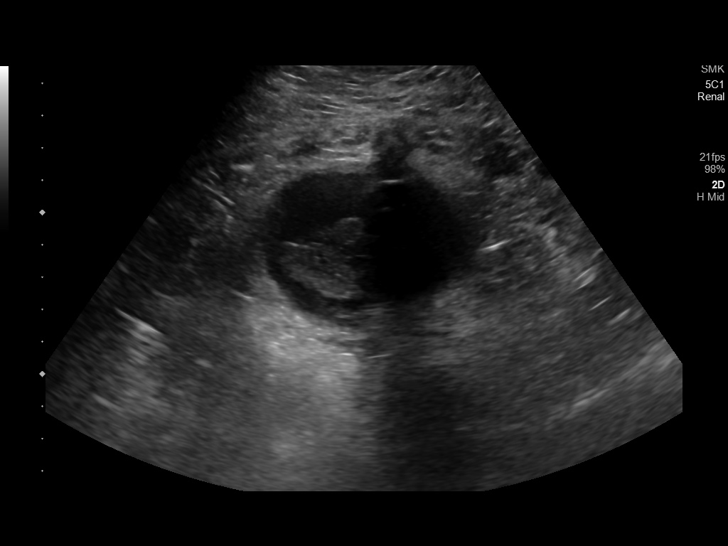
[im 3/22]
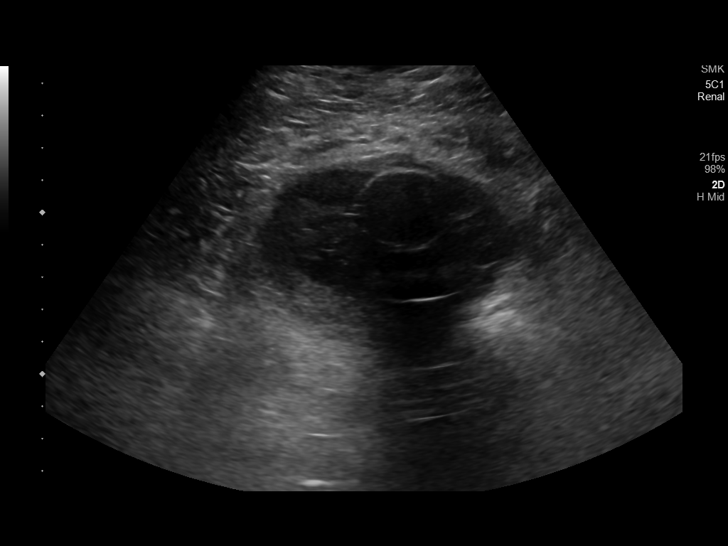
[im 4/22]
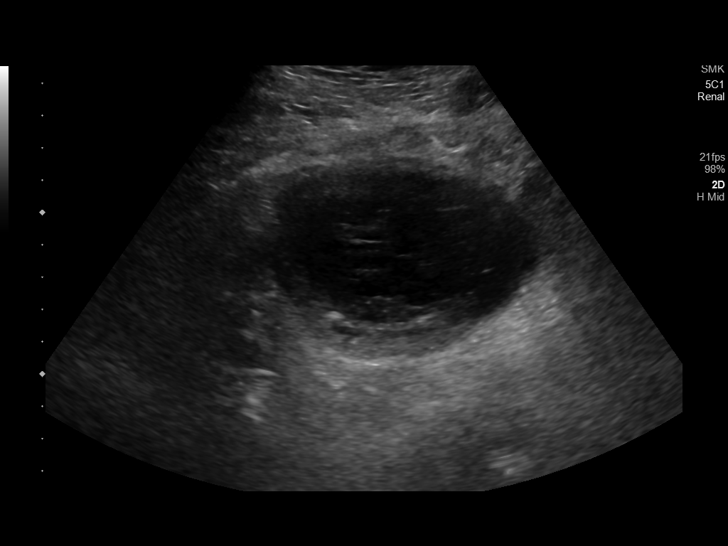
[im 6/22]
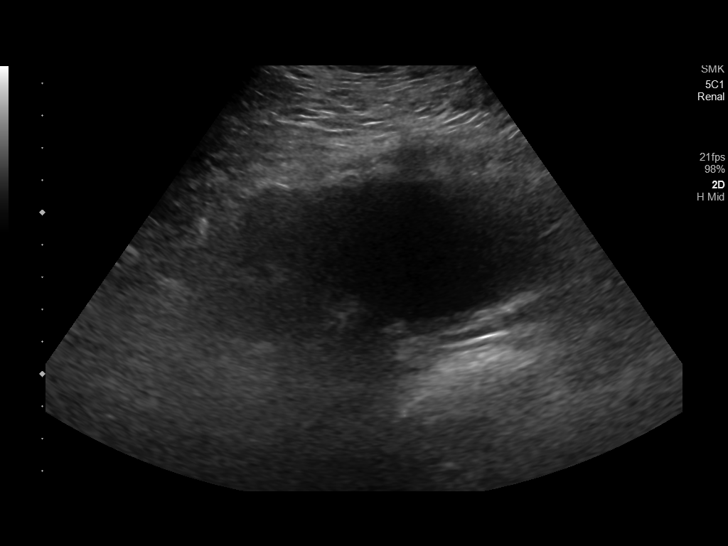
[im 8/22]
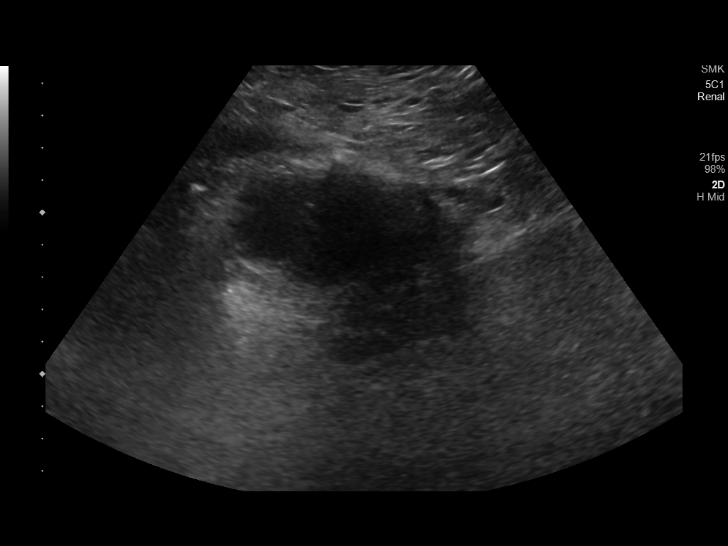
[im 9/22]
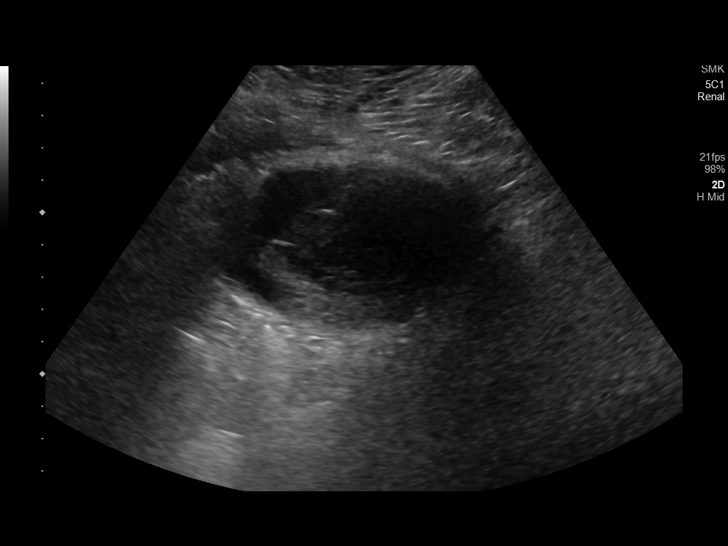
[im 11/22]
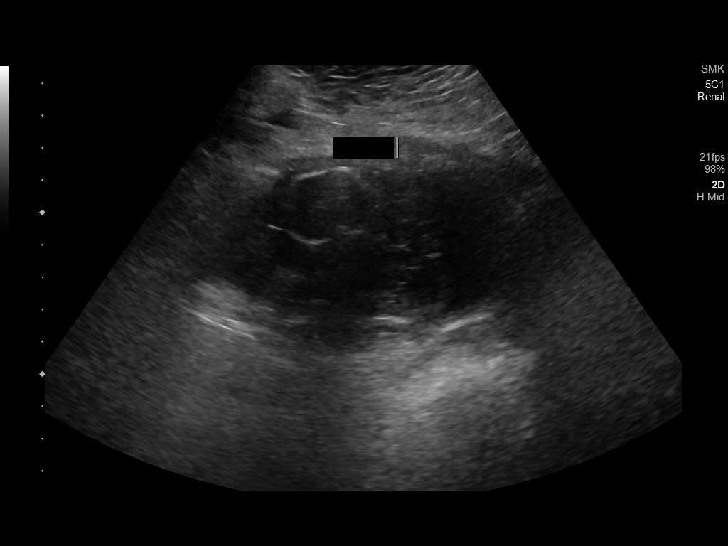
[im 12/22]
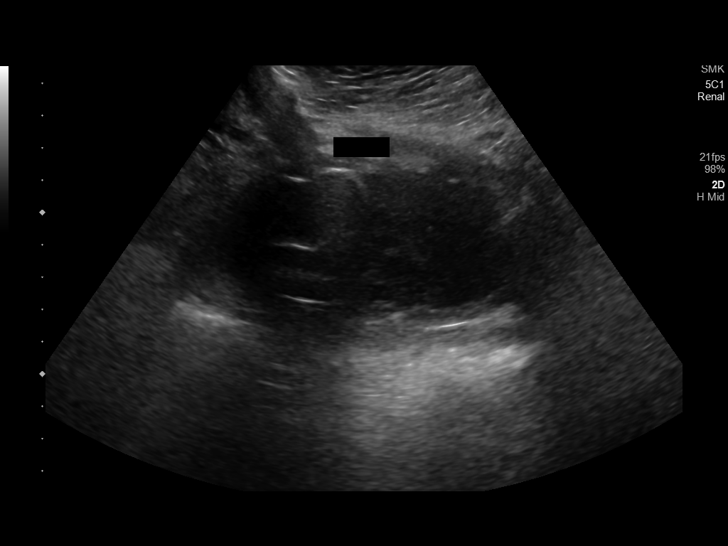
[im 14/22]
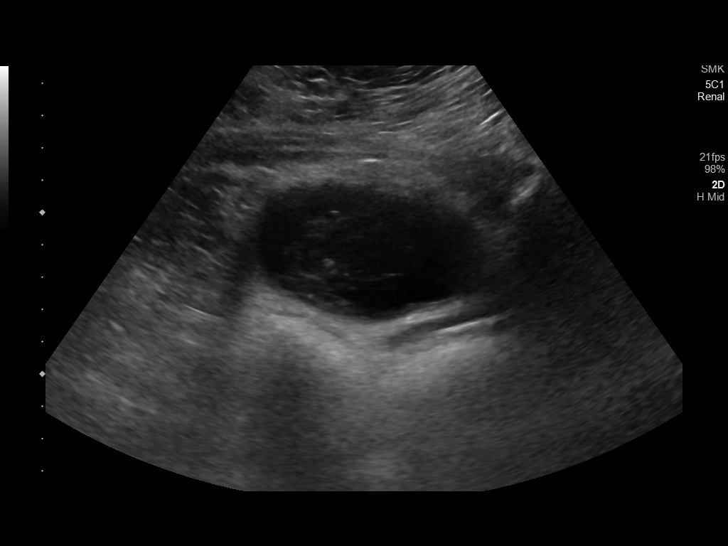
[im 15/22]
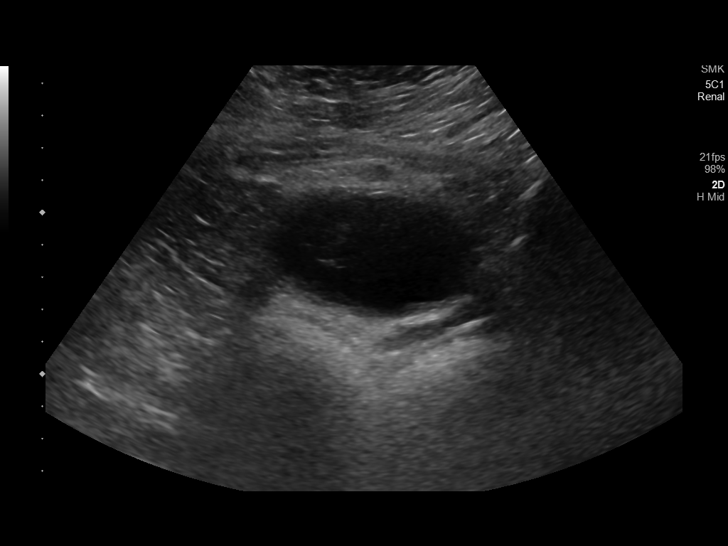
[im 17/22]
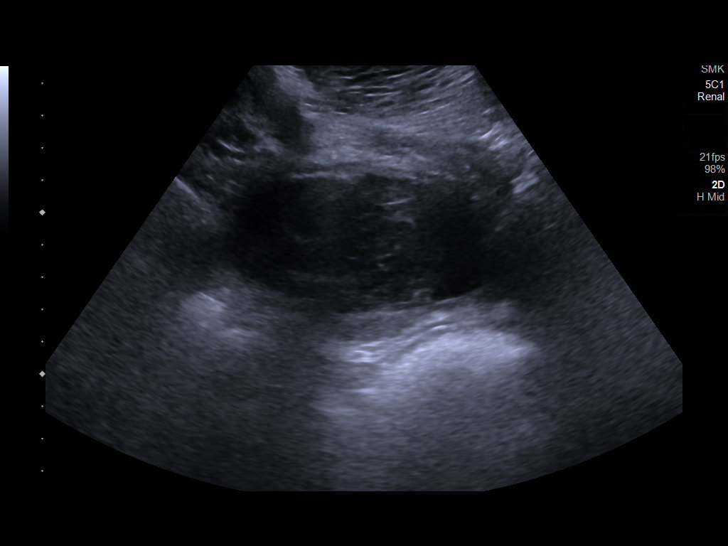
[im 19/22]
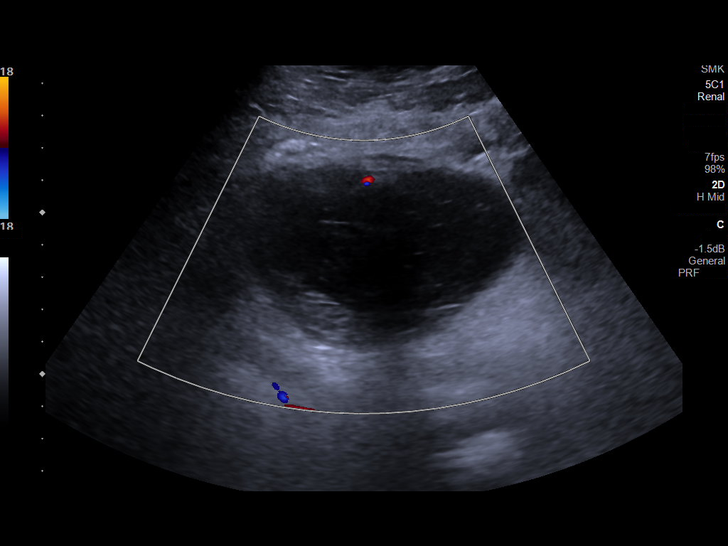
[im 20/22]
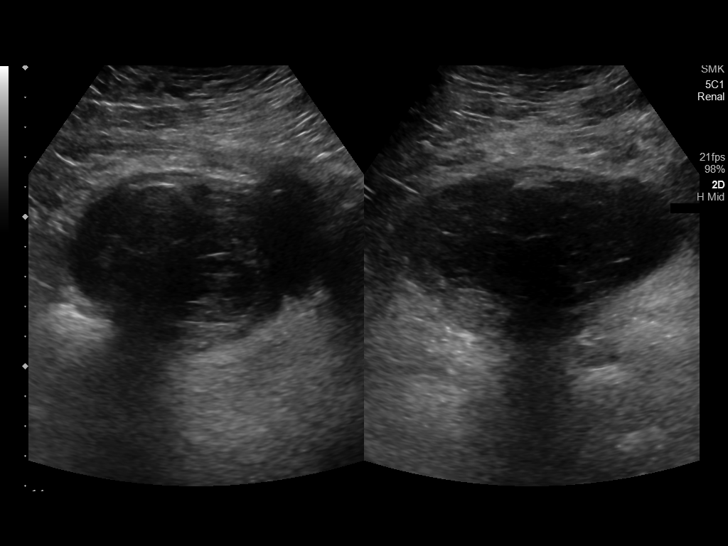
[im 22/22]
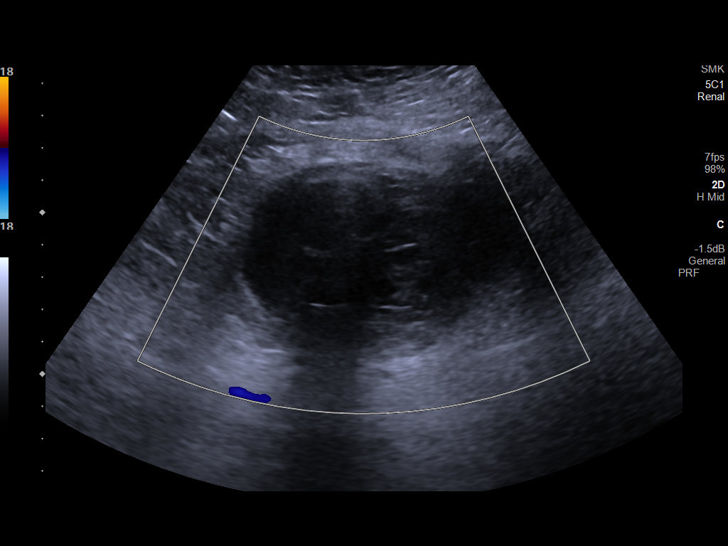

[14 of 22 positions shown; findings below may reference images not displayed]

FINDINGS: Limited sonographic evaluation was performed of the bladder. Balloon
tip of suprapubic catheter appears to be within the bladder lumen.
There appears to be predominantly hypodense but complex material
within the urinary bladder which may represent debris or possibly
clot.
IMPRESSION: Balloon tip of suprapubic catheter appears to be within bladder
lumen. Predominantly hypodense but complex material is noted within
the urinary bladder lumen which may represent debris or possibly
clot.

## 2023-06-09 NOTE — Progress Notes (Signed)
Suprapubic Cath Change  Patient is present today for a suprapubic catheter change due to urinary retention.  8 ml of water was drained from the balloon, a 24 FR Silastic foley cath was removed from the tract with out difficulty.  Site was cleaned and prepped in a sterile fashion with betadine.  A 24 Silastic FR foley cath was replaced into the tract no complications were noted. Urine return was noted, 10 ml of sterile water was inflated into the balloon and a night bag was attached for drainage.  Patient tolerated well.     Performed by: Michiel Cowboy, PA-C and Luther Hearing, CMA   Follow up: Return in about 1 month (around 07/13/2023) for SPT exchange .

## 2023-06-13 ENCOUNTER — Ambulatory Visit (INDEPENDENT_AMBULATORY_CARE_PROVIDER_SITE_OTHER): Payer: Medicare Other | Admitting: Urology

## 2023-06-13 DIAGNOSIS — R339 Retention of urine, unspecified: Secondary | ICD-10-CM | POA: Diagnosis not present

## 2023-06-13 DIAGNOSIS — T83010A Breakdown (mechanical) of cystostomy catheter, initial encounter: Secondary | ICD-10-CM

## 2023-06-20 ENCOUNTER — Ambulatory Visit: Payer: Medicare Other | Admitting: Urology

## 2023-06-29 NOTE — Progress Notes (Signed)
Suprapubic Cath Change  Patient is present today for a suprapubic catheter change due to urinary retention.  8 ml of water was drained from the balloon, a 24 FR Silastic foley cath was removed from the tract with out difficulty.  Site was cleaned and prepped in a sterile fashion with betadine.  A 24 FR Silastic foley cath was replaced into the tract no complications were noted. Urine return was noted, 10 ml of sterile water was inflated into the balloon and a night bag was attached for drainage.  Patient tolerated well.   Performed by: Michiel Cowboy, PA-C and Luther Hearing, CMA (AAMA)  Follow up: Return in about 1 month (around 08/04/2023) for SPT exchange .

## 2023-07-04 ENCOUNTER — Ambulatory Visit: Payer: Medicare Other | Admitting: Urology

## 2023-07-04 ENCOUNTER — Ambulatory Visit (INDEPENDENT_AMBULATORY_CARE_PROVIDER_SITE_OTHER): Payer: Medicare Other | Admitting: Urology

## 2023-07-04 DIAGNOSIS — R339 Retention of urine, unspecified: Secondary | ICD-10-CM | POA: Diagnosis not present

## 2023-07-04 DIAGNOSIS — T83010A Breakdown (mechanical) of cystostomy catheter, initial encounter: Secondary | ICD-10-CM

## 2023-07-29 NOTE — Progress Notes (Signed)
 Suprapubic Cath Change  Patient is present today for a suprapubic catheter change due to urinary retention.  8 ml of water was drained from the balloon, a 24 FR Silastic foley cath was removed from the tract with out difficulty.  Site was cleaned and prepped in a sterile fashion with betadine.  A 24 FR Silastic foley cath was replaced into the tract no complications were noted. Urine return was noted, 10 ml of sterile water was inflated into the balloon and a night bag was attached for drainage.  Patient tolerated well.   Performed by: CLOTILDA CORNWALL, PA-C and Marcello Elouise LATHER   Follow up: Return in about 1 month (around 09/01/2023) for SPT exchange .

## 2023-08-01 ENCOUNTER — Ambulatory Visit: Payer: Medicare Other | Admitting: Urology

## 2023-08-01 ENCOUNTER — Encounter: Payer: Self-pay | Admitting: Urology

## 2023-08-01 ENCOUNTER — Other Ambulatory Visit
Admission: RE | Admit: 2023-08-01 | Discharge: 2023-08-01 | Disposition: A | Discharge status: Home / Self Care | Payer: Medicare Other | Attending: Urology | Admitting: Urology

## 2023-08-01 DIAGNOSIS — R82998 Other abnormal findings in urine: Secondary | ICD-10-CM | POA: Diagnosis not present

## 2023-08-01 DIAGNOSIS — R8290 Unspecified abnormal findings in urine: Secondary | ICD-10-CM

## 2023-08-01 DIAGNOSIS — N319 Neuromuscular dysfunction of bladder, unspecified: Secondary | ICD-10-CM

## 2023-08-01 DIAGNOSIS — R339 Retention of urine, unspecified: Secondary | ICD-10-CM

## 2023-08-01 DIAGNOSIS — T83010A Breakdown (mechanical) of cystostomy catheter, initial encounter: Secondary | ICD-10-CM

## 2023-08-01 LAB — URINALYSIS, COMPLETE (UACMP) WITH MICROSCOPIC
Glucose, UA: 100 mg/dL — AB
Ketones, ur: NEGATIVE mg/dL
Nitrite: POSITIVE — AB
Protein, ur: >300 mg/dL — AB
RBC / HPF: >50 RBC/hpf (ref 0–5)
Specific Gravity, Urine: 1.025 (ref 1.005–1.030)
pH: 7 (ref 5.0–8.0)

## 2023-08-01 MED ORDER — CEFUROXIME AXETIL 500 MG PO TABS: 500.0000 mg | ORAL_TABLET | Freq: Two times a day (BID) | ORAL | 0 refills | Status: DC

## 2023-08-01 NOTE — Progress Notes (Signed)
 08/01/2023 9:29 PM   Jesus Sanchez [facility] June 27, 1951 [id number]  Referring provider: Lenon Sanchez ORN, MD [address] [facility] [place],  [place] [address]  Urological history 1. Neurogenic bladder -secondary to MS -managed with SPT exchanged monthly -last exchange 08/01/2023  2. High risk hematuria -former smoker -admitted for CBI at [facility] 04/2021 -non-contrast and contrast CT 04/2021 NED -cysto 2022 NED  3. Nephrolithiasis -Punctate nonobstructive nephrolithiasis in the inferior pole of the left kidney on CT 2022  Chief Complaint  Patient presents with   SPT    HPI: Jesus Sanchez is a 73 y.o. male who presents today SPT exchange with his wife, Jesus Sanchez who states his urine has a very foul odor and he has not been feeling well.   Previous records reviewed.     He has been having foul smelling urine for one week.  He had a cough and fever as well, but the cough and fever have abated.  Patient denies any modifying or aggravating factors.  Patient denies any recent UTI's, gross hematuria, dysuria or suprapubic/flank pain.  Patient denies any fevers, chills, nausea or vomiting.    CATH UA nitrite positive, 11-20 WBC's, > 50 RBC's and many bacteria.    PMH: Past Medical History:  Diagnosis Date   Complication of anesthesia    with sedation had to have narcan  2020   Coronary artery disease    Depression    Diabetes mellitus without complication (HCC)    diet controlled   GERD (gastroesophageal reflux disease)    Hypertension    MS (multiple sclerosis) (HCC) 10/02/2015   Neuromuscular disorder (HCC)    multiple sclerosis   Sleep apnea     Surgical History: Past Surgical History:  Procedure Laterality Date   CHOLECYSTECTOMY N/A 09/02/2016   Procedure: LAPAROSCOPIC CHOLECYSTECTOMY WITH CHOLANGIOGRAM;  Surgeon: Carlin Pastel, MD;  Location: [facility] ORS;  Service: General;  Laterality: N/A;   CORONARY ANGIOPLASTY     stent 2000    CYSTOSCOPY WITH URETHRAL DILATATION N/A 09/04/2019   Procedure: CYSTOSCOPY WITH Suprapubic Tract DILATATION;  Surgeon: Twylla Glendia BROCKS, MD;  Location: [facility] ORS;  Service: Urology;  Laterality: N/A;   HERNIA REPAIR  2014   [facility]- Dr. Eluterio   IR CATHETER TUBE CHANGE  12/21/2019   IR GENERIC HISTORICAL  07/02/2016   IR PERC CHOLECYSTOSTOMY 07/02/2016 Juliene Balder, MD [facility]   suprapubic tube insertion      TONSILLECTOMY      Home Medications:  Allergies as of 08/01/2023       Reactions   Ciprofloxacin  Itching, Hives   itchi9ng and area proximal to IV site red and blotchy  Rash itchi9ng and area proximal to IV site red and blotchy  Rash   Iodinated Contrast Media Itching   Itching x 3 days   Oxycodone-acetaminophen  Itching        Medication List        Accurate as of August 01, 2023  9:29 PM. If you have any questions, ask your nurse or doctor.          aspirin  EC 81 MG tablet Take 81 mg by mouth at bedtime.   atorvastatin  10 MG tablet Commonly known as: LIPITOR Take 10 mg by mouth at bedtime.   Baclofen 5 MG Tabs Take 1 tablet by mouth 3 (three) times daily as needed.   cefUROXime  500 MG tablet Commonly known as: CEFTIN  Take 1 tablet (500 mg total) by mouth 2 (two)  times daily with a meal.   citalopram  40 MG tablet Commonly known as: CELEXA  Take 40 mg by mouth daily.   furosemide 20 MG tablet Commonly known as: LASIX Take 20 mg by mouth daily as needed (fluid retention.).   gabapentin  300 MG capsule Commonly known as: NEURONTIN  Take 900 mg by mouth 3 (three) times daily.   ibuprofen 200 MG tablet Commonly known as: ADVIL Take 600 mg by mouth every 8 (eight) hours as needed (for pain.).   losartan  50 MG tablet Commonly known as: COZAAR  Take 50 mg by mouth daily.   metFORMIN  500 MG tablet Commonly known as: GLUCOPHAGE  Take 1,000 mg by mouth daily with breakfast. 500mg  in the evening   natalizumab  300 MG/15ML injection Commonly known as:  TYSABRI  Inject 300 mg into the vein every 6 (six) weeks. Received at [facility].   OneTouch Ultra Test test strip Generic drug: glucose blood USE TO TEST 2 TIMES DAILY  AS INSTRUCTED.   Oxcarbazepine  300 MG tablet Commonly known as: TRILEPTAL  Take by mouth.   oxybutynin  5 MG tablet Commonly known as: DITROPAN  TAKE 1 TABLET BY MOUTH EVERY 8  HOURS AS NEEDED FOR URINARY  INCONTINENCE   PAIN MANAGEMENT INTRATHECAL (IT) PUMP 1 each by Intrathecal route continuous. Intrathecal (IT) medication:Baclofen   pantoprazole  40 MG tablet Commonly known as: PROTONIX  Take 1 tablet by mouth 2 (two) times daily before a meal.   trimethoprim  100 MG tablet Commonly known as: TRIMPEX  Take by mouth.        Allergies:  Allergies  Allergen Reactions   Ciprofloxacin  Itching and Hives    itchi9ng and area proximal to IV site red and blotchy   Rash  itchi9ng and area proximal to IV site red and blotchy  Rash   Iodinated Contrast Media Itching    Itching x 3 days   Oxycodone-Acetaminophen  Itching    Family History: Family History  Problem Relation Age of Onset   Ulcerative colitis Mother    Diabetes Mother    Diabetes Sister    Colon polyps Sister    Prostate cancer Neg Hx    Bladder Cancer Neg Hx    Kidney cancer Neg Hx     Social History:  reports that he quit smoking about 44 years ago. His smoking use included cigarettes. He started smoking about 69 years ago. He has a 50 pack-year smoking history. He has never been exposed to tobacco smoke. He quit smokeless tobacco use about 44 years ago. He reports that he does not drink alcohol and does not use drugs.  ROS: Pertinent ROS in HPI  Physical Exam: There were no vitals taken for this visit.  Constitutional:  Well nourished. Alert and oriented, No acute distress. HEENT: [place] AT, moist mucus membranes.  Trachea midline Cardiovascular: No clubbing, cyanosis, or edema. Respiratory: Normal respiratory effort, no increased work of  breathing. GI: Abdomen is soft, non tender, non distended, no abdominal masses. SPT site clean and dry.   GU: No CVA tenderness.  Bladder tender to palpation.   Urine in the Foley bag has a very strong, foul odor Neurologic: Grossly intact, no focal deficits, moving all 4 extremities. Psychiatric: Normal mood and affect.   Laboratory Data: Urinalysis See EPIC and HPI  I have reviewed the labs.    Pertinent Imaging: N/A  Assessment & Plan:    1. Neurogenic bladder -continue with monthly SPT exchanges   2. UTI -UA grossly infected -urine culture pending -started on Ceftin  500 mg BID, will  adjust if necessary once culture results are available  3. Microscopic hematuria -UA w/ micro heme -will recheck urine upon return   Return in about 1 month (around 09/01/2023) for SPT exchange .  These notes generated with voice recognition software. I apologize for typographical errors.  CLOTILDA CORNWALL, PA-C  [facility] [address]  [address] [place], [place] [address] [phone]

## 2023-08-05 ENCOUNTER — Other Ambulatory Visit: Payer: Self-pay | Admitting: Urology

## 2023-08-05 DIAGNOSIS — B9689 Other specified bacterial agents as the cause of diseases classified elsewhere: Secondary | ICD-10-CM

## 2023-08-05 DIAGNOSIS — R3989 Other symptoms and signs involving the genitourinary system: Secondary | ICD-10-CM

## 2023-08-05 LAB — URINE CULTURE: Culture: 70000 — AB

## 2023-08-05 MED ORDER — FOSFOMYCIN TROMETHAMINE 3 G PO PACK: 3.0000 g | PACK | Freq: Once | ORAL | 0 refills | Status: AC

## 2023-08-17 ENCOUNTER — Ambulatory Visit
Admission: RE | Admit: 2023-08-17 | Discharge: 2023-08-17 | Disposition: A | Discharge status: Home / Self Care | Payer: Medicare Other | Source: Ambulatory Visit | Attending: Podiatry | Admitting: Podiatry

## 2023-08-17 ENCOUNTER — Other Ambulatory Visit: Payer: Self-pay | Admitting: Podiatry

## 2023-08-17 DIAGNOSIS — L03116 Cellulitis of left lower limb: Secondary | ICD-10-CM | POA: Diagnosis present

## 2023-08-17 DIAGNOSIS — I824Y2 Acute embolism and thrombosis of unspecified deep veins of left proximal lower extremity: Secondary | ICD-10-CM

## 2023-08-25 NOTE — Progress Notes (Deleted)
 Suprapubic Cath Change  Patient is present today for a suprapubic catheter change due to urinary retention.  8 ml of water was drained from the balloon, a 24 FR Silastic foley cath was removed from the tract with out difficulty.  Site was cleaned and prepped in a sterile fashion with betadine.  A 24 FR Silastic foley cath was replaced into the tract no complications were noted. Urine return was noted, 10 ml of sterile water was inflated into the balloon and a night bag was attached for drainage.  Patient tolerated well.   Performed by: Michiel Cowboy, PA-C and ***  Follow up: No follow-ups on file.

## 2023-08-29 ENCOUNTER — Ambulatory Visit: Payer: Self-pay | Admitting: Urology

## 2023-08-29 DIAGNOSIS — Z435 Encounter for attention to cystostomy: Secondary | ICD-10-CM

## 2023-08-31 ENCOUNTER — Telehealth: Payer: Self-pay | Admitting: Urology

## 2023-08-31 NOTE — Telephone Encounter (Signed)
He "no showed" for his appointment in [place] for cath change on [place] and I do not see where he is rescheduled.  Would you call him and get him rescheduled for his cath exchange in [place]?

## 2023-09-11 NOTE — Progress Notes (Signed)
 Suprapubic Cath Change  Patient is present today for a suprapubic catheter change due to urinary retention.  8 ml of water was drained from the balloon, a 24 FR Silastic foley cath was removed from the tract with out difficulty.  Site was cleaned and prepped in a sterile fashion with betadine.  A 24 FR Silastic foley cath was replaced into the tract no complications were noted. Urine return was noted, 10 ml of sterile water was inflated into the balloon and a night  bag was attached for drainage.  Patient tolerated well.   Performed by: Michiel Cowboy, PA-C and Titus Mould Hopkins, CMA  ***  Follow up: Return in about 1 month (around 10/10/2023) for SPT exchange .

## 2023-09-12 ENCOUNTER — Ambulatory Visit: Payer: Medicare Other | Admitting: Urology

## 2023-09-12 VITALS — BP 183/80 | HR 65 | Ht 70.0 in | Wt 175.0 lb

## 2023-09-12 DIAGNOSIS — R3989 Other symptoms and signs involving the genitourinary system: Secondary | ICD-10-CM

## 2023-09-12 DIAGNOSIS — R339 Retention of urine, unspecified: Secondary | ICD-10-CM | POA: Diagnosis not present

## 2023-10-09 NOTE — Progress Notes (Unsigned)
 Suprapubic Cath Change  Patient is present today for a suprapubic catheter change due to urinary retention.  8 ml of water was drained from the balloon, a 24 FR Silastic foley cath was removed from the tract with out difficulty.  Site was cleaned and prepped in a sterile fashion with betadine.  A 24 FR Silastic foley cath was replaced into the tract no complications were noted. Urine return was noted, 10 ml of sterile water was inflated into the balloon and a night bag was attached for drainage.  Patient tolerated well.   Performed by: Michiel Cowboy, PA-C and Luther Hearing, CMA (AMMA)   Follow up: Return in about 1 month (around 11/10/2023) for SPT exchange .

## 2023-10-10 ENCOUNTER — Ambulatory Visit: Payer: Medicare Other | Admitting: Urology

## 2023-10-10 DIAGNOSIS — Z435 Encounter for attention to cystostomy: Secondary | ICD-10-CM | POA: Diagnosis not present

## 2023-11-06 NOTE — Progress Notes (Deleted)
Error

## 2023-11-07 ENCOUNTER — Encounter: Payer: Self-pay | Admitting: Urology

## 2023-11-07 ENCOUNTER — Ambulatory Visit: Admitting: Urology

## 2023-11-07 DIAGNOSIS — T83010A Breakdown (mechanical) of cystostomy catheter, initial encounter: Secondary | ICD-10-CM

## 2023-11-23 ENCOUNTER — Ambulatory Visit: Admitting: Urology

## 2023-11-23 DIAGNOSIS — R339 Retention of urine, unspecified: Secondary | ICD-10-CM

## 2023-11-23 DIAGNOSIS — Z435 Encounter for attention to cystostomy: Secondary | ICD-10-CM

## 2023-11-23 NOTE — Progress Notes (Signed)
 Suprapubic Cath Change  Patient is present today for a suprapubic catheter change due to urinary retention.  8 ml of water was drained from the balloon, a 24 FR Silastic foley cath was removed from the tract with out difficulty.  Site was cleaned and prepped in a sterile fashion with betadine.  A 24 FR Silastic foley cath was replaced into the tract no complications were noted. Urine return was noted, 10 ml of sterile water was inflated into the balloon and a night bag was attached for drainage.  Patient tolerated well.   Performed by: Matilde Son, PA-C   Follow up: Return in about 1 month (around 12/24/2023) for SPT exchange .    I gave him 3 night bags, some StatLock's and a 24 [place] Silastic Foley to have on hand so his wife can change it if it should become obstructed.

## 2023-12-26 ENCOUNTER — Ambulatory Visit (INDEPENDENT_AMBULATORY_CARE_PROVIDER_SITE_OTHER): Admitting: Urology

## 2023-12-26 DIAGNOSIS — Z435 Encounter for attention to cystostomy: Secondary | ICD-10-CM | POA: Diagnosis not present

## 2023-12-26 NOTE — Progress Notes (Signed)
 Suprapubic Cath Change  Patient is present today for a suprapubic catheter change due to urinary retention.  8 ml of water was drained from the balloon, a 24 FR Silastic foley cath was removed from the tract with out difficulty.  Site was cleaned and prepped in a sterile fashion with betadine.  A 24 FR Silastic foley cath was replaced into the tract no complications were noted. Urine return was noted, 10 ml of sterile water was inflated into the balloon and a night  bag was attached for drainage.  Patient tolerated well.   Performed by: Ineze Serrao, PA-C and [place], CMA (AAMA)    Follow up: Return in about 1 month (around 01/25/2024) for SPT exchange .

## 2023-12-31 ENCOUNTER — Other Ambulatory Visit: Payer: Self-pay | Admitting: Urology

## 2023-12-31 DIAGNOSIS — N319 Neuromuscular dysfunction of bladder, unspecified: Secondary | ICD-10-CM

## 2024-01-21 NOTE — Progress Notes (Deleted)
 Suprapubic Cath Change  Patient is present today for a suprapubic catheter change due to urinary retention.  8 ml of water was drained from the balloon, a 24 FR foley cath was removed from the tract with out difficulty.  Site was cleaned and prepped in a sterile fashion with betadine.  A 24 FR foley cath was replaced into the tract no complications were noted. Urine return was noted, 10 ml of sterile water was inflated into the balloon and a night bag was attached for drainage.  Patient tolerated well.     Performed by: CLOTILDA CORNWALL, PA-C and ***  Follow up: No follow-ups on file.

## 2024-01-23 ENCOUNTER — Ambulatory Visit: Admitting: Urology

## 2024-01-23 DIAGNOSIS — Z435 Encounter for attention to cystostomy: Secondary | ICD-10-CM

## 2024-01-27 ENCOUNTER — Encounter: Payer: Self-pay | Admitting: Urology

## 2024-02-26 NOTE — Progress Notes (Deleted)
 Suprapubic Cath Change  Patient is present today for a suprapubic catheter change due to urinary retention.  8 ml of water was drained from the balloon, a 24 FR Silastic foley cath was removed from the tract with out difficulty.  Site was cleaned and prepped in a sterile fashion with betadine.  A 24 FR Silastic foley cath was replaced into the tract {dnt [id number]}. Urine return was noted, 10 ml of sterile water was inflated into the balloon and a *** bag was attached for drainage.  Patient tolerated well. A night bag was given to patient and proper instruction was given on how to switch bags.    Performed by: ***  Follow up: No follow-ups on file.

## 2024-02-27 ENCOUNTER — Ambulatory Visit: Admitting: Urology

## 2024-02-27 VITALS — BP 184/98 | HR 91 | Wt 169.0 lb

## 2024-02-27 DIAGNOSIS — Z435 Encounter for attention to cystostomy: Secondary | ICD-10-CM | POA: Diagnosis not present

## 2024-02-27 NOTE — Progress Notes (Signed)
 Suprapubic Cath Change  Patient is present today for a suprapubic catheter change due to urinary retention.  8 ml of water was drained from the balloon, a 24 FR Silastic foley cath was removed from the tract with out difficulty.  Site was cleaned and prepped in a sterile fashion with betadine.  A 24 FR Silastic foley cath was replaced into the tract no complications were noted. Urine return was noted, 10 ml of sterile water was inflated into the balloon and a night bag was attached for drainage.  Patient tolerated well.   Performed by: CLOTILDA CORNWALL, PA-C and Andrea DELENA Kirks, LPN   Follow up: Return in about 1 month (around 03/29/2024) for SPT exchange .

## 2024-03-26 ENCOUNTER — Ambulatory Visit: Admitting: Urology

## 2024-03-26 ENCOUNTER — Encounter: Payer: Self-pay | Admitting: Hematology and Oncology

## 2024-03-26 VITALS — BP 166/84 | HR 68 | Wt 169.0 lb

## 2024-03-26 DIAGNOSIS — Z435 Encounter for attention to cystostomy: Secondary | ICD-10-CM

## 2024-03-26 NOTE — Progress Notes (Signed)
   Suprapubic Cath Change  Patient is present today for a suprapubic catheter change due to urinary retention.  8 ml of water was drained from the balloon, a 24 FR Silasitc  foley cath was removed from the tract with out difficulty.  Site was cleaned and prepped in a sterile fashion with betadine.  A 20 FR foley cath was replaced into the tract no complications were noted. Urine return was noted, 10 ml of sterile water was inflated into the balloon and a night bag was attached for drainage.  Patient tolerated well.    Performed by: CLOTILDA CORNWALL, PA-C and Andrea DELENA Kirks, LPN   Follow up: Return in about 4 weeks (around 04/23/2024).

## 2024-04-22 NOTE — Progress Notes (Deleted)
Error

## 2024-04-23 ENCOUNTER — Ambulatory Visit: Admitting: Urology

## 2024-04-23 DIAGNOSIS — Z435 Encounter for attention to cystostomy: Secondary | ICD-10-CM

## 2024-05-04 ENCOUNTER — Ambulatory Visit: Admitting: Urology

## 2024-05-04 DIAGNOSIS — R32 Unspecified urinary incontinence: Secondary | ICD-10-CM | POA: Diagnosis not present

## 2024-05-04 DIAGNOSIS — N319 Neuromuscular dysfunction of bladder, unspecified: Secondary | ICD-10-CM

## 2024-05-04 DIAGNOSIS — R3989 Other symptoms and signs involving the genitourinary system: Secondary | ICD-10-CM

## 2024-05-04 DIAGNOSIS — R339 Retention of urine, unspecified: Secondary | ICD-10-CM

## 2024-05-04 DIAGNOSIS — Z87442 Personal history of urinary calculi: Secondary | ICD-10-CM

## 2024-05-04 DIAGNOSIS — R8290 Unspecified abnormal findings in urine: Secondary | ICD-10-CM

## 2024-05-04 DIAGNOSIS — N390 Urinary tract infection, site not specified: Secondary | ICD-10-CM

## 2024-05-04 MED ORDER — TRIMETHOPRIM 100 MG PO TABS: 100.0000 mg | ORAL_TABLET | Freq: Every day | ORAL | 3 refills | Status: DC

## 2024-05-04 MED ORDER — CEFTRIAXONE SODIUM 500 MG IJ SOLR
500.0000 mg | Freq: Once | INTRAMUSCULAR | Status: AC
  Administered 2024-05-04: 500 mg via INTRAMUSCULAR

## 2024-05-04 MED ORDER — TRIMETHOPRIM 100 MG PO TABS: 100.0000 mg | ORAL_TABLET | Freq: Every day | ORAL | 3 refills | Status: AC

## 2024-05-04 NOTE — Progress Notes (Unsigned)
 After obtaining consent, and per orders of Clotilda Cornwall, PA, injection of Ceftriaxone  1g given by [place], CMA (AAMA). Patient instructed to remain in clinic for 20 minutes afterwards, and to report any adverse reaction to me immediately. Patient tolerate well.

## 2024-05-06 ENCOUNTER — Encounter: Payer: Self-pay | Admitting: Urology

## 2024-05-06 NOTE — Progress Notes (Signed)
 05/04/2024 11:16 PM   Jesus Sanchez [facility] September 12, 1950 [id number]  Referring provider: Lenon Sanchez ORN, MD [address] [facility] [place],  [place] [address]  Urological history 1. Neurogenic bladder -secondary to MS -managed with SPT exchanged monthly -last exchange 08/01/2023  2. High risk hematuria -former smoker -admitted for CBI at [facility] 04/2021 -non-contrast and contrast CT 04/2021 NED -cysto 2022 NED  3. Nephrolithiasis -Punctate nonobstructive nephrolithiasis in the inferior pole of the left kidney on CT 2022  Chief Complaint  Patient presents with   SPT     HPI: Jesus Sanchez is a 73 y.o. man who presents today with his wife, Jesus Sanchez, who states his urine has been leaking from his penis and the catheter has been getting clogged.  Previous records reviewed.     He was scheduled to have a SPT exchanged last week, but his wife exchanged the tube the week prior because it had stopped draining due to sediment.  Last night and this morning, they noticed that the urine has been leaking through the penis and he has not had as much output.  Patient denies any modifying or aggravating factors.  Patient denies any recent UTI's, gross hematuria, dysuria or suprapubic/flank pain.  Patient denies any fevers, chills, nausea or vomiting.    UA was yellow cloudy.    PMH: Past Medical History:  Diagnosis Date   Complication of anesthesia    with sedation had to have narcan  2020   Coronary artery disease    Depression    Diabetes mellitus without complication (HCC)    diet controlled   GERD (gastroesophageal reflux disease)    Hypertension    MS (multiple sclerosis) 10/02/2015   Neuromuscular disorder (HCC)    multiple sclerosis   Sleep apnea     Surgical History: Past Surgical History:  Procedure Laterality Date   CHOLECYSTECTOMY N/A 09/02/2016   Procedure: LAPAROSCOPIC CHOLECYSTECTOMY WITH CHOLANGIOGRAM;  Surgeon: Carlin Pastel,  MD;  Location: [facility] ORS;  Service: General;  Laterality: N/A;   CORONARY ANGIOPLASTY     stent 2000   CYSTOSCOPY WITH URETHRAL DILATATION N/A 09/04/2019   Procedure: CYSTOSCOPY WITH Suprapubic Tract DILATATION;  Surgeon: Twylla Glendia BROCKS, MD;  Location: [facility] ORS;  Service: Urology;  Laterality: N/A;   HERNIA REPAIR  2014   [facility]- Dr. Eluterio   IR CATHETER TUBE CHANGE  12/21/2019   IR GENERIC HISTORICAL  07/02/2016   IR PERC CHOLECYSTOSTOMY 07/02/2016 Juliene Balder, MD [facility]   suprapubic tube insertion      TONSILLECTOMY      Home Medications:  Allergies as of 05/04/2024       Reactions   Ciprofloxacin  Hives, Itching   itchi9ng and area proximal to IV site red and blotchy  Rash itchi9ng and area proximal to IV site red and blotchy  Rash itchi9ng and area proximal to IV site red and blotchy   Rash  itchi9ng and area proximal to IV site red and blotchy  Rash   Iodinated Contrast Media Itching   Itching x 3 days   Oxycodone-acetaminophen  Itching        Medication List        Accurate as of May 04, 2024 11:59 PM. If you have any questions, ask your nurse or doctor.          aspirin  EC 81 MG tablet Take 81 mg by mouth at bedtime.   atorvastatin  10 MG tablet Commonly known as: LIPITOR Take 10 mg  by mouth at bedtime.   citalopram  40 MG tablet Commonly known as: CELEXA  Take 40 mg by mouth daily.   Contour Plus Blue w/Device Kit   furosemide 20 MG tablet Commonly known as: LASIX Take 20 mg by mouth daily as needed (fluid retention.).   gabapentin  300 MG capsule Commonly known as: NEURONTIN  Take 900 mg by mouth 3 (three) times daily.   ibuprofen 200 MG tablet Commonly known as: ADVIL Take 600 mg by mouth every 8 (eight) hours as needed (for pain.).   losartan  50 MG tablet Commonly known as: COZAAR  Take 50 mg by mouth daily.   metFORMIN  500 MG tablet Commonly known as: GLUCOPHAGE  Take 1,000 mg by mouth daily with breakfast. 500mg  in the evening    Mounjaro 5 MG/0.5ML Pen Generic drug: tirzepatide Inject 5 mg into the skin once a week.   natalizumab  300 MG/15ML injection Commonly known as: TYSABRI  Inject 300 mg into the vein every 6 (six) weeks. Received at [facility].   OneTouch Ultra Test test strip Generic drug: glucose blood USE TO TEST 2 TIMES DAILY  AS INSTRUCTED.   Precision QID Test test strip Generic drug: glucose blood 1 strip.   oxybutynin  5 MG tablet Commonly known as: DITROPAN  TAKE 1 TABLET BY MOUTH EVERY 8  HOURS AS NEEDED FOR URINARY  INCONTINENCE   PAIN MANAGEMENT INTRATHECAL (IT) PUMP 1 each by Intrathecal route continuous. Intrathecal (IT) medication:Baclofen   pantoprazole  40 MG tablet Commonly known as: PROTONIX  Take 1 tablet by mouth 2 (two) times daily before a meal.   trimethoprim  100 MG tablet Commonly known as: TRIMPEX  Take 1 tablet (100 mg total) by mouth daily.        Allergies:  Allergies  Allergen Reactions   Ciprofloxacin  Hives and Itching    itchi9ng and area proximal to IV site red and blotchy   Rash  itchi9ng and area proximal to IV site red and blotchy  Rash  itchi9ng and area proximal to IV site red and blotchy   Rash  itchi9ng and area proximal to IV site red and blotchy  Rash   Iodinated Contrast Media Itching    Itching x 3 days   Oxycodone-Acetaminophen  Itching    Family History: Family History  Problem Relation Age of Onset   Ulcerative colitis Mother    Diabetes Mother    Diabetes Sister    Colon polyps Sister    Prostate cancer Neg Hx    Bladder Cancer Neg Hx    Kidney cancer Neg Hx     Social History:  reports that he quit smoking about 45 years ago. His smoking use included cigarettes. He started smoking about 70 years ago. He has a 50 pack-year smoking history. He has never been exposed to tobacco smoke. He quit smokeless tobacco use about 44 years ago. He reports that he does not drink alcohol and does not use drugs.  ROS: Pertinent ROS in  HPI  Physical Exam: Constitutional:  Well nourished. Alert and oriented, No acute distress. HEENT: [place] AT, moist mucus membranes.  Trachea midline Cardiovascular: No clubbing, cyanosis, or edema. Respiratory: Normal respiratory effort, no increased work of breathing. GU: No CVA tenderness.  No bladder fullness or masses.  SPT site clean and dry.   Neurologic: In wheelchair.   Psychiatric: Normal mood and affect.   Laboratory Data: See EPIC and HPI  I have reviewed the labs.    Pertinent Imaging: N/A  Suprapubic Cath Change Patient is present today for a suprapubic catheter  change due to urinary retention.  4 ml of water was drained from the balloon, a 20 FR foley cath was removed from the tract with out difficulty.  Site was cleaned and prepped in a sterile fashion with betadine.  A 24 FR foley cath was replaced into the tract no complications were noted. Urine return was noted, 10 ml of sterile water was inflated into the balloon and a night bag was attached for drainage.  Patient tolerated well.   Performed by: Elson Ulbrich, PA-C and [place], CMA (AAMA)    Assessment & Plan:    1. Neurogenic bladder -continue with monthly SPT exchanges  -urine culture pending - 1 g of Rocephin  was given today IM in the office and abundance of caution as he was not draining well overnight and he was given a refill on trimethoprim  100 mg daily for prophylaxis -Wife was given instructions on how to irrigate the catheter if it should become clogged  Return in about 1 month (around 06/04/2024) for SPT exchange .  These notes generated with voice recognition software. I apologize for typographical errors.  CLOTILDA CORNWALL, PA-C  [facility] [address]  [address] [place], [place] [address] [phone]

## 2024-05-09 ENCOUNTER — Ambulatory Visit: Payer: Self-pay | Admitting: Urology

## 2024-05-09 LAB — CULTURE, URINE COMPREHENSIVE

## 2024-05-09 NOTE — Progress Notes (Signed)
 Called patient and spoke to the patients wife and she understood and said that he is not having symptoms

## 2024-05-21 ENCOUNTER — Ambulatory Visit: Admitting: Urology

## 2024-05-24 NOTE — Progress Notes (Signed)
 Suprapubic Cath Change  Patient is present today for a suprapubic catheter change due to urinary retention.  8 ml of water was drained from the balloon, a 24 FR foley cath was removed from the tract with out difficulty.  Site was cleaned and prepped in a sterile fashion with betadine.  A 24 FR foley cath was replaced into the tract no complications were noted. Urine return was noted, 10 ml of sterile water was inflated into the balloon and a night bag was attached for drainage.  Patient tolerated well.   Performed by: CLOTILDA CORNWALL, PA-C and Andrea DELENA Kirks, LPN   Follow up: Return in about 1 month (around 06/27/2024) for SPT exchange .

## 2024-05-28 ENCOUNTER — Ambulatory Visit: Admitting: Urology

## 2024-05-28 VITALS — BP 147/75 | HR 74 | Wt 175.0 lb

## 2024-05-28 DIAGNOSIS — Z435 Encounter for attention to cystostomy: Secondary | ICD-10-CM | POA: Diagnosis not present

## 2024-06-05 ENCOUNTER — Encounter: Payer: Self-pay | Admitting: Urology

## 2024-06-23 NOTE — Progress Notes (Unsigned)
 Suprapubic Cath Change  Patient is present today for a suprapubic catheter change due to urinary retention.  9 ml of water was drained from the balloon, a 24 FR foley cath was removed from the tract with out difficulty.  Site was cleaned and prepped in a sterile fashion with betadine.  A 24 FR foley cath was replaced into the tract no complications were noted. Urine return was noted, 10 ml of sterile water was inflated into the balloon and a night bag was attached for drainage.  Patient tolerated well. A night bag was given to patient and proper instruction was given on how to switch bags.    They have been having issues with the catheter clogging off.  His wife is a former engineer, civil (consulting) and she is able to exchange the catheter and irrigated as well.  She has had to do both since he was last seen here.  They denied any hematuria.  It is getting clogged with sediment and mucus.  He has not had any pain, fevers, vomiting, or other signs of any systemic symptoms.  I went ahead and prescribed gluconic acid-citric acid (RENACIDIN ) irrigation - Insert 30 mL into suprapubic tube and clamp tube for 10 minutes and then drain twice daily.  I also gave them the prescription for the vinegar irrigations.  I also placed an order for a CT renal stone study to evaluate for any possible bladder stones or kidney stones that may be contributing to sludge and sediment formation.  They were given a catheter exchange kit, 24 French suprapubic catheter, irrigation kit and supplies.    Performed by: CLOTILDA CORNWALL, PA-C and Andrea DELENA Kirks, LPN   Follow up: Return in about 1 month (around 07/26/2024) for SPT exchange.

## 2024-06-25 ENCOUNTER — Ambulatory Visit: Admitting: Urology

## 2024-06-25 VITALS — BP 170/101 | HR 83 | Wt 175.0 lb

## 2024-06-25 DIAGNOSIS — Z435 Encounter for attention to cystostomy: Secondary | ICD-10-CM | POA: Diagnosis not present

## 2024-06-25 DIAGNOSIS — N319 Neuromuscular dysfunction of bladder, unspecified: Secondary | ICD-10-CM

## 2024-06-25 MED ORDER — RENACIDIN IR SOLN: 1 refills | Status: AC

## 2024-06-25 NOTE — Patient Instructions (Addendum)
 Please Call [phone] to schedule CT scan   Vinegar Bladder Irrigation Protocol patient education  Patient's on intermittent catheterization with chronic bacteriuria and/or chronic bladder stones, irrigating the bladder with a dilute vinegar solution can be beneficial.  The recommended concentration is 0.25% acetic acid. Most grocery stores carry white vinegar as a 5% solution.  Therefore, to make appropriate bladder irrigations, it needs to be diluted at a ratio of roughly 20:1.  To achieve this, see the chart below to determine what amount of 5% white vinegar solution you should mix with your normal bladder irrigation (homemade saline or sterile sodium chloride  from the pharmacy).  Amount of 5% White Vinegar Solution to Mix In: If Your Normal Bladder Irrigation Amount Is: 2.5 teaspoons (12.5 mL) 250 mL irrigation 5 teaspoons (25 mL) 500 mL irrigation 10 teaspoons (50 mL) 1000 mL irrigation About 6 ounces 1 gallon irrigation  It might be helpful to remember:  One teaspoon = 5 mL  One tablespoon = 15 mL  Irrigate the bladder with this solution using the volumes and techniques you've discussed with your health care provider. In certain circumstances, your provider might instruct you to leave some irrigation in the bladder for a period of time to dissolve debris/mucous. Discontinue irrigation if it causes pain or discomfort. Do not use this solution if you believe your child has an acute urinary tract infection.

## 2024-07-23 ENCOUNTER — Ambulatory Visit
Admission: RE | Admit: 2024-07-23 | Discharge: 2024-07-23 | Disposition: A | Discharge status: Home / Self Care | Source: Ambulatory Visit | Attending: Urology | Admitting: Urology

## 2024-07-23 DIAGNOSIS — N319 Neuromuscular dysfunction of bladder, unspecified: Secondary | ICD-10-CM | POA: Insufficient documentation

## 2024-07-23 DIAGNOSIS — I2510 Atherosclerotic heart disease of native coronary artery without angina pectoris: Secondary | ICD-10-CM | POA: Insufficient documentation

## 2024-07-23 DIAGNOSIS — K4090 Unilateral inguinal hernia, without obstruction or gangrene, not specified as recurrent: Secondary | ICD-10-CM | POA: Diagnosis not present

## 2024-07-23 DIAGNOSIS — Z9889 Other specified postprocedural states: Secondary | ICD-10-CM | POA: Diagnosis not present

## 2024-07-23 DIAGNOSIS — I700 Atherosclerosis of aorta: Secondary | ICD-10-CM | POA: Diagnosis not present

## 2024-07-23 DIAGNOSIS — Z9351 Cutaneous-vesicostomy status: Secondary | ICD-10-CM | POA: Diagnosis not present

## 2024-07-23 DIAGNOSIS — N132 Hydronephrosis with renal and ureteral calculous obstruction: Secondary | ICD-10-CM | POA: Insufficient documentation

## 2024-07-29 NOTE — Progress Notes (Unsigned)
 Suprapubic Cath Change  Patient is present today for a suprapubic catheter change due to urinary retention.  9 ml of water was drained from the balloon, a 24 FR foley cath was removed from the tract with out difficulty.  Site was cleaned and prepped in a sterile fashion with betadine.  A 24 FR foley cath was replaced into the tract no complications were noted. Urine return was noted, 10 ml of sterile water was inflated into the balloon and a night bag was attached for drainage.  Patient tolerated well.    Performed by: CLOTILDA CORNWALL, PA-C and Andrea DELENA Kirks, LPN   Follow up: Return in about 1 month (around 08/30/2024) for SPT exchange.   CT renal stone study did not show any bladder stones, but there are left non obstructing stones.   The left ureter is dilated, but no left hydro is appreciated.  He has significant constipation with a left inguinal hernia containing stool.  Radiologist interpretation still pending.  I will call them with those results.

## 2024-07-30 ENCOUNTER — Ambulatory Visit: Admitting: Urology

## 2024-07-30 VITALS — BP 162/93 | HR 77

## 2024-07-30 DIAGNOSIS — Z435 Encounter for attention to cystostomy: Secondary | ICD-10-CM | POA: Diagnosis not present

## 2024-08-01 ENCOUNTER — Ambulatory Visit: Payer: Self-pay | Admitting: Urology

## 2024-08-23 ENCOUNTER — Telehealth: Payer: Self-pay

## 2024-08-23 NOTE — Telephone Encounter (Signed)
 Pts NP called us  stated she had tested his urine today and it was positive for nitrates. Pt also had gross hematuria. After speaking with PA that follows him for his SP tube here she said he was fine to wait until his appt on 2/11 due to this being recurrent. If he is not symptomatic he can f/u then. NP stated she will let patient know that as long as he is not symptomatic he can wait until his f/u but if he does become symptomatic then he needs to go to the ER>

## 2024-08-27 ENCOUNTER — Ambulatory Visit: Admitting: Urology

## 2024-08-29 ENCOUNTER — Ambulatory Visit: Admitting: Urology
